# Patient Record
Sex: Female | Born: 1935 | Race: White | Hispanic: No | Smoking: Never smoker
Health system: Southern US, Community
[De-identification: ages and names within clinical notes are randomized; demographics above are authoritative.]

## PROBLEM LIST (undated history)

## (undated) DIAGNOSIS — E039 Hypothyroidism, unspecified: Secondary | ICD-10-CM

## (undated) DIAGNOSIS — I10 Essential (primary) hypertension: Secondary | ICD-10-CM

## (undated) DIAGNOSIS — S72002A Fracture of unspecified part of neck of left femur, initial encounter for closed fracture: Secondary | ICD-10-CM

## (undated) DIAGNOSIS — I739 Peripheral vascular disease, unspecified: Secondary | ICD-10-CM

## (undated) DIAGNOSIS — J449 Chronic obstructive pulmonary disease, unspecified: Secondary | ICD-10-CM

## (undated) DIAGNOSIS — I509 Heart failure, unspecified: Secondary | ICD-10-CM

## (undated) DIAGNOSIS — S72109A Unspecified trochanteric fracture of unspecified femur, initial encounter for closed fracture: Secondary | ICD-10-CM

## (undated) DIAGNOSIS — K219 Gastro-esophageal reflux disease without esophagitis: Secondary | ICD-10-CM

## (undated) DIAGNOSIS — F0390 Unspecified dementia without behavioral disturbance: Secondary | ICD-10-CM

## (undated) DIAGNOSIS — J9611 Chronic respiratory failure with hypoxia: Secondary | ICD-10-CM

## (undated) DIAGNOSIS — G9340 Encephalopathy, unspecified: Secondary | ICD-10-CM

## (undated) DIAGNOSIS — R479 Unspecified speech disturbances: Secondary | ICD-10-CM

## (undated) DIAGNOSIS — I5032 Chronic diastolic (congestive) heart failure: Secondary | ICD-10-CM

## (undated) DIAGNOSIS — I4891 Unspecified atrial fibrillation: Secondary | ICD-10-CM

## (undated) DIAGNOSIS — H409 Unspecified glaucoma: Secondary | ICD-10-CM

## (undated) HISTORY — DX: Hypothyroidism, unspecified: E03.9

## (undated) HISTORY — DX: Unspecified glaucoma: H40.9

## (undated) HISTORY — DX: Encephalopathy, unspecified: G93.40

## (undated) HISTORY — DX: Unspecified dementia without behavioral disturbance: F03.90

## (undated) HISTORY — DX: Unspecified trochanteric fracture of unspecified femur, initial encounter for closed fracture: S72.109A

## (undated) HISTORY — DX: Peripheral vascular disease, unspecified: I73.9

## (undated) HISTORY — DX: Chronic respiratory failure with hypoxia: J96.11

## (undated) HISTORY — DX: Unspecified speech disturbances: R47.9

## (undated) HISTORY — DX: Fracture of unspecified part of neck of left femur, initial encounter for closed fracture: S72.002A

## (undated) HISTORY — PX: HERNIA REPAIR: SHX51

## (undated) HISTORY — DX: Chronic diastolic (congestive) heart failure: I50.32

## (undated) HISTORY — DX: Gastro-esophageal reflux disease without esophagitis: K21.9

---

## 2014-11-26 DIAGNOSIS — K5901 Slow transit constipation: Secondary | ICD-10-CM | POA: Diagnosis not present

## 2014-11-26 DIAGNOSIS — R0989 Other specified symptoms and signs involving the circulatory and respiratory systems: Secondary | ICD-10-CM | POA: Diagnosis not present

## 2014-11-26 DIAGNOSIS — G894 Chronic pain syndrome: Secondary | ICD-10-CM | POA: Diagnosis not present

## 2014-11-26 DIAGNOSIS — R230 Cyanosis: Secondary | ICD-10-CM | POA: Diagnosis not present

## 2014-11-26 DIAGNOSIS — Z7189 Other specified counseling: Secondary | ICD-10-CM | POA: Diagnosis not present

## 2014-11-26 DIAGNOSIS — I739 Peripheral vascular disease, unspecified: Secondary | ICD-10-CM | POA: Diagnosis not present

## 2014-11-26 DIAGNOSIS — I719 Aortic aneurysm of unspecified site, without rupture: Secondary | ICD-10-CM | POA: Diagnosis not present

## 2014-11-26 DIAGNOSIS — R2681 Unsteadiness on feet: Secondary | ICD-10-CM | POA: Diagnosis not present

## 2014-11-26 DIAGNOSIS — R7989 Other specified abnormal findings of blood chemistry: Secondary | ICD-10-CM | POA: Diagnosis not present

## 2014-11-26 DIAGNOSIS — K5701 Diverticulitis of small intestine with perforation and abscess with bleeding: Secondary | ICD-10-CM | POA: Diagnosis not present

## 2014-11-26 DIAGNOSIS — Z0000 Encounter for general adult medical examination without abnormal findings: Secondary | ICD-10-CM | POA: Diagnosis not present

## 2014-11-26 DIAGNOSIS — M150 Primary generalized (osteo)arthritis: Secondary | ICD-10-CM | POA: Diagnosis not present

## 2014-11-26 DIAGNOSIS — H9193 Unspecified hearing loss, bilateral: Secondary | ICD-10-CM | POA: Diagnosis not present

## 2014-11-26 DIAGNOSIS — H3530 Unspecified macular degeneration: Secondary | ICD-10-CM | POA: Diagnosis not present

## 2014-12-02 DIAGNOSIS — H3532 Exudative age-related macular degeneration: Secondary | ICD-10-CM | POA: Diagnosis not present

## 2014-12-07 DIAGNOSIS — R0989 Other specified symptoms and signs involving the circulatory and respiratory systems: Secondary | ICD-10-CM | POA: Diagnosis not present

## 2014-12-07 DIAGNOSIS — Z7901 Long term (current) use of anticoagulants: Secondary | ICD-10-CM | POA: Diagnosis not present

## 2014-12-07 DIAGNOSIS — R230 Cyanosis: Secondary | ICD-10-CM | POA: Diagnosis not present

## 2014-12-07 DIAGNOSIS — S81802S Unspecified open wound, left lower leg, sequela: Secondary | ICD-10-CM | POA: Diagnosis not present

## 2014-12-07 DIAGNOSIS — S81802A Unspecified open wound, left lower leg, initial encounter: Secondary | ICD-10-CM | POA: Diagnosis not present

## 2014-12-07 DIAGNOSIS — R2681 Unsteadiness on feet: Secondary | ICD-10-CM | POA: Diagnosis not present

## 2014-12-07 DIAGNOSIS — Z7982 Long term (current) use of aspirin: Secondary | ICD-10-CM | POA: Diagnosis not present

## 2014-12-31 DIAGNOSIS — Z7982 Long term (current) use of aspirin: Secondary | ICD-10-CM | POA: Diagnosis not present

## 2014-12-31 DIAGNOSIS — I10 Essential (primary) hypertension: Secondary | ICD-10-CM | POA: Diagnosis not present

## 2014-12-31 DIAGNOSIS — H401234 Low-tension glaucoma, bilateral, indeterminate stage: Secondary | ICD-10-CM | POA: Diagnosis not present

## 2014-12-31 DIAGNOSIS — Z961 Presence of intraocular lens: Secondary | ICD-10-CM | POA: Diagnosis not present

## 2014-12-31 DIAGNOSIS — H04123 Dry eye syndrome of bilateral lacrimal glands: Secondary | ICD-10-CM | POA: Diagnosis not present

## 2014-12-31 DIAGNOSIS — H1851 Endothelial corneal dystrophy: Secondary | ICD-10-CM | POA: Diagnosis not present

## 2014-12-31 DIAGNOSIS — H3531 Nonexudative age-related macular degeneration: Secondary | ICD-10-CM | POA: Diagnosis not present

## 2014-12-31 DIAGNOSIS — Z7901 Long term (current) use of anticoagulants: Secondary | ICD-10-CM | POA: Diagnosis not present

## 2015-01-11 DIAGNOSIS — S81802D Unspecified open wound, left lower leg, subsequent encounter: Secondary | ICD-10-CM | POA: Diagnosis not present

## 2015-01-11 DIAGNOSIS — I8312 Varicose veins of left lower extremity with inflammation: Secondary | ICD-10-CM | POA: Diagnosis not present

## 2015-01-11 DIAGNOSIS — R2681 Unsteadiness on feet: Secondary | ICD-10-CM | POA: Diagnosis not present

## 2015-01-11 DIAGNOSIS — I739 Peripheral vascular disease, unspecified: Secondary | ICD-10-CM | POA: Diagnosis not present

## 2015-01-11 DIAGNOSIS — I872 Venous insufficiency (chronic) (peripheral): Secondary | ICD-10-CM | POA: Diagnosis not present

## 2015-01-26 DIAGNOSIS — Z880 Allergy status to penicillin: Secondary | ICD-10-CM | POA: Diagnosis not present

## 2015-01-26 DIAGNOSIS — Z7982 Long term (current) use of aspirin: Secondary | ICD-10-CM | POA: Diagnosis not present

## 2015-01-26 DIAGNOSIS — I509 Heart failure, unspecified: Secondary | ICD-10-CM | POA: Diagnosis not present

## 2015-01-26 DIAGNOSIS — L97922 Non-pressure chronic ulcer of unspecified part of left lower leg with fat layer exposed: Secondary | ICD-10-CM | POA: Diagnosis not present

## 2015-01-26 DIAGNOSIS — I8311 Varicose veins of right lower extremity with inflammation: Secondary | ICD-10-CM | POA: Diagnosis not present

## 2015-01-26 DIAGNOSIS — I4891 Unspecified atrial fibrillation: Secondary | ICD-10-CM | POA: Diagnosis not present

## 2015-01-26 DIAGNOSIS — I872 Venous insufficiency (chronic) (peripheral): Secondary | ICD-10-CM | POA: Diagnosis not present

## 2015-01-26 DIAGNOSIS — I739 Peripheral vascular disease, unspecified: Secondary | ICD-10-CM | POA: Diagnosis not present

## 2015-01-26 DIAGNOSIS — Z79899 Other long term (current) drug therapy: Secondary | ICD-10-CM | POA: Diagnosis not present

## 2015-01-26 DIAGNOSIS — I272 Other secondary pulmonary hypertension: Secondary | ICD-10-CM | POA: Diagnosis not present

## 2015-01-26 DIAGNOSIS — I8312 Varicose veins of left lower extremity with inflammation: Secondary | ICD-10-CM | POA: Diagnosis not present

## 2015-01-26 DIAGNOSIS — S81802D Unspecified open wound, left lower leg, subsequent encounter: Secondary | ICD-10-CM | POA: Diagnosis not present

## 2015-01-26 DIAGNOSIS — R600 Localized edema: Secondary | ICD-10-CM | POA: Diagnosis not present

## 2015-02-02 DIAGNOSIS — Z7982 Long term (current) use of aspirin: Secondary | ICD-10-CM | POA: Diagnosis not present

## 2015-02-02 DIAGNOSIS — I10 Essential (primary) hypertension: Secondary | ICD-10-CM | POA: Diagnosis not present

## 2015-02-02 DIAGNOSIS — H401234 Low-tension glaucoma, bilateral, indeterminate stage: Secondary | ICD-10-CM | POA: Diagnosis not present

## 2015-02-02 DIAGNOSIS — H1851 Endothelial corneal dystrophy: Secondary | ICD-10-CM | POA: Diagnosis not present

## 2015-02-02 DIAGNOSIS — H3531 Nonexudative age-related macular degeneration: Secondary | ICD-10-CM | POA: Diagnosis not present

## 2015-02-02 DIAGNOSIS — Z961 Presence of intraocular lens: Secondary | ICD-10-CM | POA: Diagnosis not present

## 2015-02-02 DIAGNOSIS — I872 Venous insufficiency (chronic) (peripheral): Secondary | ICD-10-CM | POA: Diagnosis not present

## 2015-02-02 DIAGNOSIS — I509 Heart failure, unspecified: Secondary | ICD-10-CM | POA: Diagnosis not present

## 2015-02-02 DIAGNOSIS — Z880 Allergy status to penicillin: Secondary | ICD-10-CM | POA: Diagnosis not present

## 2015-02-02 DIAGNOSIS — S81802D Unspecified open wound, left lower leg, subsequent encounter: Secondary | ICD-10-CM | POA: Diagnosis not present

## 2015-02-04 DIAGNOSIS — I872 Venous insufficiency (chronic) (peripheral): Secondary | ICD-10-CM | POA: Diagnosis not present

## 2015-02-04 DIAGNOSIS — F0390 Unspecified dementia without behavioral disturbance: Secondary | ICD-10-CM | POA: Diagnosis not present

## 2015-02-04 DIAGNOSIS — I4891 Unspecified atrial fibrillation: Secondary | ICD-10-CM | POA: Diagnosis not present

## 2015-02-04 DIAGNOSIS — I272 Other secondary pulmonary hypertension: Secondary | ICD-10-CM | POA: Diagnosis not present

## 2015-02-04 DIAGNOSIS — M4854XD Collapsed vertebra, not elsewhere classified, thoracic region, subsequent encounter for fracture with routine healing: Secondary | ICD-10-CM | POA: Diagnosis not present

## 2015-02-04 DIAGNOSIS — R296 Repeated falls: Secondary | ICD-10-CM | POA: Diagnosis not present

## 2015-02-04 DIAGNOSIS — I83028 Varicose veins of left lower extremity with ulcer other part of lower leg: Secondary | ICD-10-CM | POA: Diagnosis not present

## 2015-02-04 DIAGNOSIS — E785 Hyperlipidemia, unspecified: Secondary | ICD-10-CM | POA: Diagnosis not present

## 2015-02-04 DIAGNOSIS — I739 Peripheral vascular disease, unspecified: Secondary | ICD-10-CM | POA: Diagnosis not present

## 2015-02-04 DIAGNOSIS — I270 Primary pulmonary hypertension: Secondary | ICD-10-CM | POA: Diagnosis not present

## 2015-02-04 DIAGNOSIS — I83029 Varicose veins of left lower extremity with ulcer of unspecified site: Secondary | ICD-10-CM | POA: Diagnosis not present

## 2015-02-04 DIAGNOSIS — Z7901 Long term (current) use of anticoagulants: Secondary | ICD-10-CM | POA: Diagnosis not present

## 2015-02-04 DIAGNOSIS — R54 Age-related physical debility: Secondary | ICD-10-CM | POA: Diagnosis not present

## 2015-02-04 DIAGNOSIS — I10 Essential (primary) hypertension: Secondary | ICD-10-CM | POA: Diagnosis not present

## 2015-02-04 DIAGNOSIS — H3530 Unspecified macular degeneration: Secondary | ICD-10-CM | POA: Diagnosis not present

## 2015-02-04 DIAGNOSIS — M549 Dorsalgia, unspecified: Secondary | ICD-10-CM | POA: Diagnosis not present

## 2015-02-04 DIAGNOSIS — K862 Cyst of pancreas: Secondary | ICD-10-CM | POA: Diagnosis not present

## 2015-02-04 DIAGNOSIS — D259 Leiomyoma of uterus, unspecified: Secondary | ICD-10-CM | POA: Diagnosis not present

## 2015-02-04 DIAGNOSIS — Z23 Encounter for immunization: Secondary | ICD-10-CM | POA: Diagnosis not present

## 2015-02-04 DIAGNOSIS — D473 Essential (hemorrhagic) thrombocythemia: Secondary | ICD-10-CM | POA: Diagnosis not present

## 2015-02-04 DIAGNOSIS — E041 Nontoxic single thyroid nodule: Secondary | ICD-10-CM | POA: Diagnosis not present

## 2015-02-04 DIAGNOSIS — Z66 Do not resuscitate: Secondary | ICD-10-CM | POA: Diagnosis not present

## 2015-02-04 DIAGNOSIS — R600 Localized edema: Secondary | ICD-10-CM | POA: Diagnosis not present

## 2015-02-04 DIAGNOSIS — D696 Thrombocytopenia, unspecified: Secondary | ICD-10-CM | POA: Diagnosis not present

## 2015-02-04 DIAGNOSIS — Z7982 Long term (current) use of aspirin: Secondary | ICD-10-CM | POA: Diagnosis not present

## 2015-02-04 DIAGNOSIS — H9193 Unspecified hearing loss, bilateral: Secondary | ICD-10-CM | POA: Diagnosis not present

## 2015-02-04 DIAGNOSIS — M792 Neuralgia and neuritis, unspecified: Secondary | ICD-10-CM | POA: Diagnosis not present

## 2015-02-04 DIAGNOSIS — L97829 Non-pressure chronic ulcer of other part of left lower leg with unspecified severity: Secondary | ICD-10-CM | POA: Diagnosis not present

## 2015-02-04 DIAGNOSIS — M150 Primary generalized (osteo)arthritis: Secondary | ICD-10-CM | POA: Diagnosis not present

## 2015-02-09 DIAGNOSIS — I8311 Varicose veins of right lower extremity with inflammation: Secondary | ICD-10-CM | POA: Diagnosis not present

## 2015-02-09 DIAGNOSIS — I8312 Varicose veins of left lower extremity with inflammation: Secondary | ICD-10-CM | POA: Diagnosis not present

## 2015-02-09 DIAGNOSIS — L97922 Non-pressure chronic ulcer of unspecified part of left lower leg with fat layer exposed: Secondary | ICD-10-CM | POA: Diagnosis not present

## 2015-02-09 DIAGNOSIS — I509 Heart failure, unspecified: Secondary | ICD-10-CM | POA: Diagnosis not present

## 2015-02-09 DIAGNOSIS — I872 Venous insufficiency (chronic) (peripheral): Secondary | ICD-10-CM | POA: Diagnosis not present

## 2015-02-09 DIAGNOSIS — I4891 Unspecified atrial fibrillation: Secondary | ICD-10-CM | POA: Diagnosis not present

## 2015-02-09 DIAGNOSIS — I739 Peripheral vascular disease, unspecified: Secondary | ICD-10-CM | POA: Diagnosis not present

## 2015-02-09 DIAGNOSIS — I83018 Varicose veins of right lower extremity with ulcer other part of lower leg: Secondary | ICD-10-CM | POA: Diagnosis not present

## 2015-02-09 DIAGNOSIS — I272 Other secondary pulmonary hypertension: Secondary | ICD-10-CM | POA: Diagnosis not present

## 2015-02-16 DIAGNOSIS — S81802D Unspecified open wound, left lower leg, subsequent encounter: Secondary | ICD-10-CM | POA: Diagnosis not present

## 2015-02-16 DIAGNOSIS — I8311 Varicose veins of right lower extremity with inflammation: Secondary | ICD-10-CM | POA: Diagnosis not present

## 2015-02-16 DIAGNOSIS — R600 Localized edema: Secondary | ICD-10-CM | POA: Diagnosis not present

## 2015-02-16 DIAGNOSIS — L97922 Non-pressure chronic ulcer of unspecified part of left lower leg with fat layer exposed: Secondary | ICD-10-CM | POA: Diagnosis not present

## 2015-02-16 DIAGNOSIS — I8312 Varicose veins of left lower extremity with inflammation: Secondary | ICD-10-CM | POA: Diagnosis not present

## 2015-02-16 DIAGNOSIS — I872 Venous insufficiency (chronic) (peripheral): Secondary | ICD-10-CM | POA: Diagnosis not present

## 2015-02-23 DIAGNOSIS — I739 Peripheral vascular disease, unspecified: Secondary | ICD-10-CM | POA: Diagnosis not present

## 2015-02-23 DIAGNOSIS — I872 Venous insufficiency (chronic) (peripheral): Secondary | ICD-10-CM | POA: Diagnosis not present

## 2015-02-23 DIAGNOSIS — L97922 Non-pressure chronic ulcer of unspecified part of left lower leg with fat layer exposed: Secondary | ICD-10-CM | POA: Diagnosis not present

## 2015-03-02 DIAGNOSIS — L97922 Non-pressure chronic ulcer of unspecified part of left lower leg with fat layer exposed: Secondary | ICD-10-CM | POA: Diagnosis not present

## 2015-03-02 DIAGNOSIS — I872 Venous insufficiency (chronic) (peripheral): Secondary | ICD-10-CM | POA: Diagnosis not present

## 2015-03-08 DIAGNOSIS — Z9841 Cataract extraction status, right eye: Secondary | ICD-10-CM | POA: Diagnosis not present

## 2015-03-08 DIAGNOSIS — H401233 Low-tension glaucoma, bilateral, severe stage: Secondary | ICD-10-CM | POA: Diagnosis not present

## 2015-03-08 DIAGNOSIS — Z833 Family history of diabetes mellitus: Secondary | ICD-10-CM | POA: Diagnosis not present

## 2015-03-08 DIAGNOSIS — Z961 Presence of intraocular lens: Secondary | ICD-10-CM | POA: Diagnosis not present

## 2015-03-08 DIAGNOSIS — H401213 Low-tension glaucoma, right eye, severe stage: Secondary | ICD-10-CM | POA: Diagnosis not present

## 2015-03-08 DIAGNOSIS — Z9842 Cataract extraction status, left eye: Secondary | ICD-10-CM | POA: Diagnosis not present

## 2015-03-08 DIAGNOSIS — H3531 Nonexudative age-related macular degeneration: Secondary | ICD-10-CM | POA: Diagnosis not present

## 2015-03-08 DIAGNOSIS — H401223 Low-tension glaucoma, left eye, severe stage: Secondary | ICD-10-CM | POA: Diagnosis not present

## 2015-03-08 DIAGNOSIS — Z8679 Personal history of other diseases of the circulatory system: Secondary | ICD-10-CM | POA: Diagnosis not present

## 2015-03-08 DIAGNOSIS — Z823 Family history of stroke: Secondary | ICD-10-CM | POA: Diagnosis not present

## 2015-03-09 DIAGNOSIS — I509 Heart failure, unspecified: Secondary | ICD-10-CM | POA: Diagnosis not present

## 2015-03-09 DIAGNOSIS — I4891 Unspecified atrial fibrillation: Secondary | ICD-10-CM | POA: Diagnosis not present

## 2015-03-09 DIAGNOSIS — I872 Venous insufficiency (chronic) (peripheral): Secondary | ICD-10-CM | POA: Diagnosis not present

## 2015-03-09 DIAGNOSIS — R600 Localized edema: Secondary | ICD-10-CM | POA: Diagnosis not present

## 2015-03-09 DIAGNOSIS — I272 Other secondary pulmonary hypertension: Secondary | ICD-10-CM | POA: Diagnosis not present

## 2015-03-09 DIAGNOSIS — R630 Anorexia: Secondary | ICD-10-CM | POA: Diagnosis not present

## 2015-03-09 DIAGNOSIS — R1310 Dysphagia, unspecified: Secondary | ICD-10-CM | POA: Diagnosis not present

## 2015-03-09 DIAGNOSIS — L97922 Non-pressure chronic ulcer of unspecified part of left lower leg with fat layer exposed: Secondary | ICD-10-CM | POA: Diagnosis not present

## 2015-03-16 DIAGNOSIS — I8390 Asymptomatic varicose veins of unspecified lower extremity: Secondary | ICD-10-CM | POA: Diagnosis not present

## 2015-03-16 DIAGNOSIS — I509 Heart failure, unspecified: Secondary | ICD-10-CM | POA: Diagnosis not present

## 2015-03-16 DIAGNOSIS — L97922 Non-pressure chronic ulcer of unspecified part of left lower leg with fat layer exposed: Secondary | ICD-10-CM | POA: Diagnosis not present

## 2015-03-16 DIAGNOSIS — R531 Weakness: Secondary | ICD-10-CM | POA: Diagnosis not present

## 2015-03-16 DIAGNOSIS — I739 Peripheral vascular disease, unspecified: Secondary | ICD-10-CM | POA: Diagnosis not present

## 2015-03-16 DIAGNOSIS — I10 Essential (primary) hypertension: Secondary | ICD-10-CM | POA: Diagnosis not present

## 2015-03-16 DIAGNOSIS — L97929 Non-pressure chronic ulcer of unspecified part of left lower leg with unspecified severity: Secondary | ICD-10-CM | POA: Diagnosis not present

## 2015-03-16 DIAGNOSIS — I272 Other secondary pulmonary hypertension: Secondary | ICD-10-CM | POA: Diagnosis not present

## 2015-03-16 DIAGNOSIS — L97812 Non-pressure chronic ulcer of other part of right lower leg with fat layer exposed: Secondary | ICD-10-CM | POA: Diagnosis not present

## 2015-03-16 DIAGNOSIS — F411 Generalized anxiety disorder: Secondary | ICD-10-CM | POA: Diagnosis not present

## 2015-03-16 DIAGNOSIS — I4891 Unspecified atrial fibrillation: Secondary | ICD-10-CM | POA: Diagnosis not present

## 2015-03-16 DIAGNOSIS — I872 Venous insufficiency (chronic) (peripheral): Secondary | ICD-10-CM | POA: Diagnosis not present

## 2015-03-22 DIAGNOSIS — E43 Unspecified severe protein-calorie malnutrition: Secondary | ICD-10-CM | POA: Diagnosis not present

## 2015-03-22 DIAGNOSIS — R4182 Altered mental status, unspecified: Secondary | ICD-10-CM | POA: Diagnosis not present

## 2015-03-22 DIAGNOSIS — T452X5A Adverse effect of vitamins, initial encounter: Secondary | ICD-10-CM | POA: Diagnosis present

## 2015-03-22 DIAGNOSIS — R110 Nausea: Secondary | ICD-10-CM | POA: Diagnosis not present

## 2015-03-22 DIAGNOSIS — Z66 Do not resuscitate: Secondary | ICD-10-CM | POA: Diagnosis present

## 2015-03-22 DIAGNOSIS — E785 Hyperlipidemia, unspecified: Secondary | ICD-10-CM | POA: Diagnosis present

## 2015-03-22 DIAGNOSIS — R2689 Other abnormalities of gait and mobility: Secondary | ICD-10-CM | POA: Diagnosis not present

## 2015-03-22 DIAGNOSIS — L97829 Non-pressure chronic ulcer of other part of left lower leg with unspecified severity: Secondary | ICD-10-CM | POA: Diagnosis not present

## 2015-03-22 DIAGNOSIS — I351 Nonrheumatic aortic (valve) insufficiency: Secondary | ICD-10-CM | POA: Diagnosis not present

## 2015-03-22 DIAGNOSIS — I361 Nonrheumatic tricuspid (valve) insufficiency: Secondary | ICD-10-CM | POA: Diagnosis not present

## 2015-03-22 DIAGNOSIS — I10 Essential (primary) hypertension: Secondary | ICD-10-CM | POA: Diagnosis present

## 2015-03-22 DIAGNOSIS — I83028 Varicose veins of left lower extremity with ulcer other part of lower leg: Secondary | ICD-10-CM | POA: Diagnosis not present

## 2015-03-22 DIAGNOSIS — M6281 Muscle weakness (generalized): Secondary | ICD-10-CM | POA: Diagnosis not present

## 2015-03-22 DIAGNOSIS — R296 Repeated falls: Secondary | ICD-10-CM | POA: Diagnosis present

## 2015-03-22 DIAGNOSIS — I340 Nonrheumatic mitral (valve) insufficiency: Secondary | ICD-10-CM | POA: Diagnosis not present

## 2015-03-22 DIAGNOSIS — R634 Abnormal weight loss: Secondary | ICD-10-CM | POA: Diagnosis not present

## 2015-03-22 DIAGNOSIS — I270 Primary pulmonary hypertension: Secondary | ICD-10-CM | POA: Diagnosis not present

## 2015-03-22 DIAGNOSIS — I5032 Chronic diastolic (congestive) heart failure: Secondary | ICD-10-CM | POA: Diagnosis not present

## 2015-03-22 DIAGNOSIS — S81802D Unspecified open wound, left lower leg, subsequent encounter: Secondary | ICD-10-CM | POA: Diagnosis not present

## 2015-03-22 DIAGNOSIS — I272 Other secondary pulmonary hypertension: Secondary | ICD-10-CM | POA: Diagnosis present

## 2015-03-22 DIAGNOSIS — R350 Frequency of micturition: Secondary | ICD-10-CM | POA: Diagnosis not present

## 2015-03-22 DIAGNOSIS — I872 Venous insufficiency (chronic) (peripheral): Secondary | ICD-10-CM | POA: Diagnosis present

## 2015-03-22 DIAGNOSIS — I878 Other specified disorders of veins: Secondary | ICD-10-CM | POA: Diagnosis present

## 2015-03-22 DIAGNOSIS — L97929 Non-pressure chronic ulcer of unspecified part of left lower leg with unspecified severity: Secondary | ICD-10-CM | POA: Diagnosis present

## 2015-03-22 DIAGNOSIS — I4891 Unspecified atrial fibrillation: Secondary | ICD-10-CM | POA: Diagnosis not present

## 2015-03-22 DIAGNOSIS — I482 Chronic atrial fibrillation: Secondary | ICD-10-CM | POA: Diagnosis present

## 2015-03-22 DIAGNOSIS — K862 Cyst of pancreas: Secondary | ICD-10-CM | POA: Diagnosis present

## 2015-03-22 DIAGNOSIS — T148 Other injury of unspecified body region: Secondary | ICD-10-CM | POA: Diagnosis not present

## 2015-03-22 DIAGNOSIS — N390 Urinary tract infection, site not specified: Secondary | ICD-10-CM | POA: Diagnosis not present

## 2015-03-22 DIAGNOSIS — R410 Disorientation, unspecified: Secondary | ICD-10-CM | POA: Diagnosis not present

## 2015-03-22 DIAGNOSIS — R1310 Dysphagia, unspecified: Secondary | ICD-10-CM | POA: Diagnosis present

## 2015-03-27 DIAGNOSIS — I872 Venous insufficiency (chronic) (peripheral): Secondary | ICD-10-CM | POA: Diagnosis not present

## 2015-03-27 DIAGNOSIS — H3531 Nonexudative age-related macular degeneration: Secondary | ICD-10-CM | POA: Diagnosis not present

## 2015-03-27 DIAGNOSIS — Z823 Family history of stroke: Secondary | ICD-10-CM | POA: Diagnosis not present

## 2015-03-27 DIAGNOSIS — I4891 Unspecified atrial fibrillation: Secondary | ICD-10-CM | POA: Diagnosis not present

## 2015-03-27 DIAGNOSIS — S81802D Unspecified open wound, left lower leg, subsequent encounter: Secondary | ICD-10-CM | POA: Diagnosis not present

## 2015-03-27 DIAGNOSIS — M810 Age-related osteoporosis without current pathological fracture: Secondary | ICD-10-CM | POA: Diagnosis present

## 2015-03-27 DIAGNOSIS — I272 Other secondary pulmonary hypertension: Secondary | ICD-10-CM | POA: Diagnosis present

## 2015-03-27 DIAGNOSIS — M7989 Other specified soft tissue disorders: Secondary | ICD-10-CM | POA: Diagnosis not present

## 2015-03-27 DIAGNOSIS — Z833 Family history of diabetes mellitus: Secondary | ICD-10-CM | POA: Diagnosis not present

## 2015-03-27 DIAGNOSIS — E46 Unspecified protein-calorie malnutrition: Secondary | ICD-10-CM | POA: Diagnosis not present

## 2015-03-27 DIAGNOSIS — H401234 Low-tension glaucoma, bilateral, indeterminate stage: Secondary | ICD-10-CM | POA: Diagnosis not present

## 2015-03-27 DIAGNOSIS — I509 Heart failure, unspecified: Secondary | ICD-10-CM | POA: Diagnosis not present

## 2015-03-27 DIAGNOSIS — Z66 Do not resuscitate: Secondary | ICD-10-CM | POA: Diagnosis present

## 2015-03-27 DIAGNOSIS — I5032 Chronic diastolic (congestive) heart failure: Secondary | ICD-10-CM | POA: Diagnosis not present

## 2015-03-27 DIAGNOSIS — M479 Spondylosis, unspecified: Secondary | ICD-10-CM | POA: Diagnosis present

## 2015-03-27 DIAGNOSIS — I5033 Acute on chronic diastolic (congestive) heart failure: Secondary | ICD-10-CM | POA: Diagnosis present

## 2015-03-27 DIAGNOSIS — H9193 Unspecified hearing loss, bilateral: Secondary | ICD-10-CM | POA: Diagnosis present

## 2015-03-27 DIAGNOSIS — R600 Localized edema: Secondary | ICD-10-CM | POA: Diagnosis not present

## 2015-03-27 DIAGNOSIS — R0602 Shortness of breath: Secondary | ICD-10-CM | POA: Diagnosis not present

## 2015-03-27 DIAGNOSIS — E785 Hyperlipidemia, unspecified: Secondary | ICD-10-CM | POA: Diagnosis present

## 2015-03-27 DIAGNOSIS — R2689 Other abnormalities of gait and mobility: Secondary | ICD-10-CM | POA: Diagnosis not present

## 2015-03-27 DIAGNOSIS — M8580 Other specified disorders of bone density and structure, unspecified site: Secondary | ICD-10-CM | POA: Diagnosis not present

## 2015-03-27 DIAGNOSIS — R1310 Dysphagia, unspecified: Secondary | ICD-10-CM | POA: Diagnosis not present

## 2015-03-27 DIAGNOSIS — J90 Pleural effusion, not elsewhere classified: Secondary | ICD-10-CM | POA: Diagnosis not present

## 2015-03-27 DIAGNOSIS — H1851 Endothelial corneal dystrophy: Secondary | ICD-10-CM | POA: Diagnosis not present

## 2015-03-27 DIAGNOSIS — M6281 Muscle weakness (generalized): Secondary | ICD-10-CM | POA: Diagnosis not present

## 2015-03-27 DIAGNOSIS — I482 Chronic atrial fibrillation: Secondary | ICD-10-CM | POA: Diagnosis not present

## 2015-03-27 DIAGNOSIS — R0600 Dyspnea, unspecified: Secondary | ICD-10-CM | POA: Diagnosis not present

## 2015-03-27 DIAGNOSIS — Z85828 Personal history of other malignant neoplasm of skin: Secondary | ICD-10-CM | POA: Diagnosis not present

## 2015-03-27 DIAGNOSIS — I480 Paroxysmal atrial fibrillation: Secondary | ICD-10-CM | POA: Diagnosis present

## 2015-03-27 DIAGNOSIS — I10 Essential (primary) hypertension: Secondary | ICD-10-CM | POA: Diagnosis present

## 2015-03-27 DIAGNOSIS — N390 Urinary tract infection, site not specified: Secondary | ICD-10-CM | POA: Diagnosis not present

## 2015-03-27 DIAGNOSIS — H409 Unspecified glaucoma: Secondary | ICD-10-CM | POA: Diagnosis present

## 2015-03-27 DIAGNOSIS — L97822 Non-pressure chronic ulcer of other part of left lower leg with fat layer exposed: Secondary | ICD-10-CM | POA: Diagnosis not present

## 2015-03-27 DIAGNOSIS — I87309 Chronic venous hypertension (idiopathic) without complications of unspecified lower extremity: Secondary | ICD-10-CM | POA: Diagnosis present

## 2015-03-27 DIAGNOSIS — I739 Peripheral vascular disease, unspecified: Secondary | ICD-10-CM | POA: Diagnosis not present

## 2015-03-27 DIAGNOSIS — Z809 Family history of malignant neoplasm, unspecified: Secondary | ICD-10-CM | POA: Diagnosis not present

## 2015-03-27 DIAGNOSIS — H3530 Unspecified macular degeneration: Secondary | ICD-10-CM | POA: Diagnosis present

## 2015-03-27 DIAGNOSIS — F329 Major depressive disorder, single episode, unspecified: Secondary | ICD-10-CM | POA: Diagnosis present

## 2015-03-27 DIAGNOSIS — I83028 Varicose veins of left lower extremity with ulcer other part of lower leg: Secondary | ICD-10-CM | POA: Diagnosis not present

## 2015-03-27 DIAGNOSIS — E43 Unspecified severe protein-calorie malnutrition: Secondary | ICD-10-CM | POA: Diagnosis not present

## 2015-03-27 DIAGNOSIS — Z961 Presence of intraocular lens: Secondary | ICD-10-CM | POA: Diagnosis not present

## 2015-03-27 DIAGNOSIS — D696 Thrombocytopenia, unspecified: Secondary | ICD-10-CM | POA: Diagnosis present

## 2015-03-27 DIAGNOSIS — L97922 Non-pressure chronic ulcer of unspecified part of left lower leg with fat layer exposed: Secondary | ICD-10-CM | POA: Diagnosis not present

## 2015-03-27 DIAGNOSIS — Z7982 Long term (current) use of aspirin: Secondary | ICD-10-CM | POA: Diagnosis not present

## 2015-03-27 DIAGNOSIS — I081 Rheumatic disorders of both mitral and tricuspid valves: Secondary | ICD-10-CM | POA: Diagnosis present

## 2015-03-27 DIAGNOSIS — L97929 Non-pressure chronic ulcer of unspecified part of left lower leg with unspecified severity: Secondary | ICD-10-CM | POA: Diagnosis present

## 2015-03-27 DIAGNOSIS — G8929 Other chronic pain: Secondary | ICD-10-CM | POA: Diagnosis present

## 2015-04-01 DIAGNOSIS — I872 Venous insufficiency (chronic) (peripheral): Secondary | ICD-10-CM | POA: Diagnosis not present

## 2015-04-01 DIAGNOSIS — L97822 Non-pressure chronic ulcer of other part of left lower leg with fat layer exposed: Secondary | ICD-10-CM | POA: Diagnosis not present

## 2015-04-01 DIAGNOSIS — L97922 Non-pressure chronic ulcer of unspecified part of left lower leg with fat layer exposed: Secondary | ICD-10-CM | POA: Diagnosis not present

## 2015-04-01 DIAGNOSIS — I739 Peripheral vascular disease, unspecified: Secondary | ICD-10-CM | POA: Diagnosis not present

## 2015-04-01 DIAGNOSIS — I83028 Varicose veins of left lower extremity with ulcer other part of lower leg: Secondary | ICD-10-CM | POA: Diagnosis not present

## 2015-04-04 DIAGNOSIS — R1310 Dysphagia, unspecified: Secondary | ICD-10-CM | POA: Diagnosis not present

## 2015-04-04 DIAGNOSIS — I4891 Unspecified atrial fibrillation: Secondary | ICD-10-CM | POA: Diagnosis not present

## 2015-04-04 DIAGNOSIS — M8580 Other specified disorders of bone density and structure, unspecified site: Secondary | ICD-10-CM | POA: Diagnosis not present

## 2015-04-04 DIAGNOSIS — I10 Essential (primary) hypertension: Secondary | ICD-10-CM | POA: Diagnosis not present

## 2015-04-04 DIAGNOSIS — Z7982 Long term (current) use of aspirin: Secondary | ICD-10-CM | POA: Diagnosis not present

## 2015-04-07 DIAGNOSIS — I4891 Unspecified atrial fibrillation: Secondary | ICD-10-CM | POA: Diagnosis not present

## 2015-04-07 DIAGNOSIS — R1310 Dysphagia, unspecified: Secondary | ICD-10-CM | POA: Diagnosis not present

## 2015-04-07 DIAGNOSIS — E46 Unspecified protein-calorie malnutrition: Secondary | ICD-10-CM | POA: Diagnosis not present

## 2015-04-07 DIAGNOSIS — I5032 Chronic diastolic (congestive) heart failure: Secondary | ICD-10-CM | POA: Diagnosis not present

## 2015-04-11 DIAGNOSIS — Z7982 Long term (current) use of aspirin: Secondary | ICD-10-CM | POA: Diagnosis not present

## 2015-04-11 DIAGNOSIS — H3531 Nonexudative age-related macular degeneration: Secondary | ICD-10-CM | POA: Diagnosis not present

## 2015-04-11 DIAGNOSIS — H401234 Low-tension glaucoma, bilateral, indeterminate stage: Secondary | ICD-10-CM | POA: Diagnosis not present

## 2015-04-11 DIAGNOSIS — Z961 Presence of intraocular lens: Secondary | ICD-10-CM | POA: Diagnosis not present

## 2015-04-11 DIAGNOSIS — H1851 Endothelial corneal dystrophy: Secondary | ICD-10-CM | POA: Diagnosis not present

## 2015-04-13 DIAGNOSIS — I872 Venous insufficiency (chronic) (peripheral): Secondary | ICD-10-CM | POA: Diagnosis not present

## 2015-04-13 DIAGNOSIS — L97922 Non-pressure chronic ulcer of unspecified part of left lower leg with fat layer exposed: Secondary | ICD-10-CM | POA: Diagnosis not present

## 2015-04-13 DIAGNOSIS — R600 Localized edema: Secondary | ICD-10-CM | POA: Diagnosis not present

## 2015-04-19 DIAGNOSIS — I509 Heart failure, unspecified: Secondary | ICD-10-CM | POA: Diagnosis not present

## 2015-04-19 DIAGNOSIS — Z823 Family history of stroke: Secondary | ICD-10-CM | POA: Diagnosis not present

## 2015-04-19 DIAGNOSIS — I480 Paroxysmal atrial fibrillation: Secondary | ICD-10-CM | POA: Diagnosis present

## 2015-04-19 DIAGNOSIS — L97929 Non-pressure chronic ulcer of unspecified part of left lower leg with unspecified severity: Secondary | ICD-10-CM | POA: Diagnosis not present

## 2015-04-19 DIAGNOSIS — I872 Venous insufficiency (chronic) (peripheral): Secondary | ICD-10-CM | POA: Diagnosis not present

## 2015-04-19 DIAGNOSIS — M722 Plantar fascial fibromatosis: Secondary | ICD-10-CM | POA: Diagnosis not present

## 2015-04-19 DIAGNOSIS — I87303 Chronic venous hypertension (idiopathic) without complications of bilateral lower extremity: Secondary | ICD-10-CM | POA: Diagnosis not present

## 2015-04-19 DIAGNOSIS — H409 Unspecified glaucoma: Secondary | ICD-10-CM | POA: Diagnosis present

## 2015-04-19 DIAGNOSIS — Z66 Do not resuscitate: Secondary | ICD-10-CM | POA: Diagnosis present

## 2015-04-19 DIAGNOSIS — I771 Stricture of artery: Secondary | ICD-10-CM | POA: Diagnosis not present

## 2015-04-19 DIAGNOSIS — I081 Rheumatic disorders of both mitral and tricuspid valves: Secondary | ICD-10-CM | POA: Diagnosis not present

## 2015-04-19 DIAGNOSIS — M810 Age-related osteoporosis without current pathological fracture: Secondary | ICD-10-CM | POA: Diagnosis present

## 2015-04-19 DIAGNOSIS — I340 Nonrheumatic mitral (valve) insufficiency: Secondary | ICD-10-CM | POA: Diagnosis not present

## 2015-04-19 DIAGNOSIS — E785 Hyperlipidemia, unspecified: Secondary | ICD-10-CM | POA: Diagnosis present

## 2015-04-19 DIAGNOSIS — I87309 Chronic venous hypertension (idiopathic) without complications of unspecified lower extremity: Secondary | ICD-10-CM | POA: Diagnosis present

## 2015-04-19 DIAGNOSIS — L98499 Non-pressure chronic ulcer of skin of other sites with unspecified severity: Secondary | ICD-10-CM | POA: Diagnosis not present

## 2015-04-19 DIAGNOSIS — Z85828 Personal history of other malignant neoplasm of skin: Secondary | ICD-10-CM | POA: Diagnosis not present

## 2015-04-19 DIAGNOSIS — I10 Essential (primary) hypertension: Secondary | ICD-10-CM | POA: Diagnosis present

## 2015-04-19 DIAGNOSIS — M479 Spondylosis, unspecified: Secondary | ICD-10-CM | POA: Diagnosis present

## 2015-04-19 DIAGNOSIS — H3530 Unspecified macular degeneration: Secondary | ICD-10-CM | POA: Diagnosis present

## 2015-04-19 DIAGNOSIS — Z7982 Long term (current) use of aspirin: Secondary | ICD-10-CM | POA: Diagnosis not present

## 2015-04-19 DIAGNOSIS — I5033 Acute on chronic diastolic (congestive) heart failure: Secondary | ICD-10-CM | POA: Diagnosis not present

## 2015-04-19 DIAGNOSIS — H9193 Unspecified hearing loss, bilateral: Secondary | ICD-10-CM | POA: Diagnosis present

## 2015-04-19 DIAGNOSIS — M1990 Unspecified osteoarthritis, unspecified site: Secondary | ICD-10-CM | POA: Diagnosis not present

## 2015-04-19 DIAGNOSIS — I4891 Unspecified atrial fibrillation: Secondary | ICD-10-CM | POA: Diagnosis not present

## 2015-04-19 DIAGNOSIS — I83029 Varicose veins of left lower extremity with ulcer of unspecified site: Secondary | ICD-10-CM | POA: Diagnosis not present

## 2015-04-19 DIAGNOSIS — F329 Major depressive disorder, single episode, unspecified: Secondary | ICD-10-CM | POA: Diagnosis not present

## 2015-04-19 DIAGNOSIS — J309 Allergic rhinitis, unspecified: Secondary | ICD-10-CM | POA: Diagnosis not present

## 2015-04-19 DIAGNOSIS — M7989 Other specified soft tissue disorders: Secondary | ICD-10-CM | POA: Diagnosis not present

## 2015-04-19 DIAGNOSIS — D696 Thrombocytopenia, unspecified: Secondary | ICD-10-CM | POA: Diagnosis not present

## 2015-04-19 DIAGNOSIS — G8929 Other chronic pain: Secondary | ICD-10-CM | POA: Diagnosis present

## 2015-04-19 DIAGNOSIS — I739 Peripheral vascular disease, unspecified: Secondary | ICD-10-CM | POA: Diagnosis not present

## 2015-04-19 DIAGNOSIS — Z833 Family history of diabetes mellitus: Secondary | ICD-10-CM | POA: Diagnosis not present

## 2015-04-19 DIAGNOSIS — R0602 Shortness of breath: Secondary | ICD-10-CM | POA: Diagnosis not present

## 2015-04-19 DIAGNOSIS — S81802A Unspecified open wound, left lower leg, initial encounter: Secondary | ICD-10-CM | POA: Diagnosis not present

## 2015-04-19 DIAGNOSIS — R0600 Dyspnea, unspecified: Secondary | ICD-10-CM | POA: Diagnosis not present

## 2015-04-19 DIAGNOSIS — Z809 Family history of malignant neoplasm, unspecified: Secondary | ICD-10-CM | POA: Diagnosis not present

## 2015-04-19 DIAGNOSIS — I272 Other secondary pulmonary hypertension: Secondary | ICD-10-CM | POA: Diagnosis not present

## 2015-04-19 DIAGNOSIS — I5032 Chronic diastolic (congestive) heart failure: Secondary | ICD-10-CM | POA: Diagnosis not present

## 2015-04-25 DIAGNOSIS — I480 Paroxysmal atrial fibrillation: Secondary | ICD-10-CM | POA: Diagnosis not present

## 2015-04-25 DIAGNOSIS — J309 Allergic rhinitis, unspecified: Secondary | ICD-10-CM | POA: Diagnosis not present

## 2015-04-25 DIAGNOSIS — R5381 Other malaise: Secondary | ICD-10-CM | POA: Diagnosis not present

## 2015-04-25 DIAGNOSIS — R262 Difficulty in walking, not elsewhere classified: Secondary | ICD-10-CM | POA: Diagnosis not present

## 2015-04-25 DIAGNOSIS — L03119 Cellulitis of unspecified part of limb: Secondary | ICD-10-CM | POA: Diagnosis not present

## 2015-04-25 DIAGNOSIS — I87303 Chronic venous hypertension (idiopathic) without complications of bilateral lower extremity: Secondary | ICD-10-CM | POA: Diagnosis not present

## 2015-04-25 DIAGNOSIS — I340 Nonrheumatic mitral (valve) insufficiency: Secondary | ICD-10-CM | POA: Diagnosis not present

## 2015-04-25 DIAGNOSIS — M150 Primary generalized (osteo)arthritis: Secondary | ICD-10-CM | POA: Diagnosis not present

## 2015-04-25 DIAGNOSIS — M722 Plantar fascial fibromatosis: Secondary | ICD-10-CM | POA: Diagnosis not present

## 2015-04-25 DIAGNOSIS — M549 Dorsalgia, unspecified: Secondary | ICD-10-CM | POA: Diagnosis not present

## 2015-04-25 DIAGNOSIS — R600 Localized edema: Secondary | ICD-10-CM | POA: Diagnosis not present

## 2015-04-25 DIAGNOSIS — I890 Lymphedema, not elsewhere classified: Secondary | ICD-10-CM | POA: Diagnosis not present

## 2015-04-25 DIAGNOSIS — I5032 Chronic diastolic (congestive) heart failure: Secondary | ICD-10-CM | POA: Diagnosis not present

## 2015-04-25 DIAGNOSIS — I5033 Acute on chronic diastolic (congestive) heart failure: Secondary | ICD-10-CM | POA: Diagnosis not present

## 2015-04-25 DIAGNOSIS — M810 Age-related osteoporosis without current pathological fracture: Secondary | ICD-10-CM | POA: Diagnosis not present

## 2015-04-25 DIAGNOSIS — M419 Scoliosis, unspecified: Secondary | ICD-10-CM | POA: Diagnosis not present

## 2015-04-25 DIAGNOSIS — R2242 Localized swelling, mass and lump, left lower limb: Secondary | ICD-10-CM | POA: Diagnosis not present

## 2015-04-25 DIAGNOSIS — F329 Major depressive disorder, single episode, unspecified: Secondary | ICD-10-CM | POA: Diagnosis not present

## 2015-04-25 DIAGNOSIS — I8312 Varicose veins of left lower extremity with inflammation: Secondary | ICD-10-CM | POA: Diagnosis not present

## 2015-04-25 DIAGNOSIS — M545 Low back pain: Secondary | ICD-10-CM | POA: Diagnosis not present

## 2015-04-25 DIAGNOSIS — M1990 Unspecified osteoarthritis, unspecified site: Secondary | ICD-10-CM | POA: Diagnosis not present

## 2015-04-25 DIAGNOSIS — D696 Thrombocytopenia, unspecified: Secondary | ICD-10-CM | POA: Diagnosis not present

## 2015-04-25 DIAGNOSIS — M461 Sacroiliitis, not elsewhere classified: Secondary | ICD-10-CM | POA: Diagnosis not present

## 2015-04-25 DIAGNOSIS — I8311 Varicose veins of right lower extremity with inflammation: Secondary | ICD-10-CM | POA: Diagnosis not present

## 2015-04-25 DIAGNOSIS — I272 Other secondary pulmonary hypertension: Secondary | ICD-10-CM | POA: Diagnosis not present

## 2015-04-25 DIAGNOSIS — I87309 Chronic venous hypertension (idiopathic) without complications of unspecified lower extremity: Secondary | ICD-10-CM | POA: Diagnosis not present

## 2015-04-25 DIAGNOSIS — M791 Myalgia: Secondary | ICD-10-CM | POA: Diagnosis not present

## 2015-04-25 DIAGNOSIS — L97922 Non-pressure chronic ulcer of unspecified part of left lower leg with fat layer exposed: Secondary | ICD-10-CM | POA: Diagnosis not present

## 2015-04-25 DIAGNOSIS — H3530 Unspecified macular degeneration: Secondary | ICD-10-CM | POA: Diagnosis not present

## 2015-04-25 DIAGNOSIS — I4891 Unspecified atrial fibrillation: Secondary | ICD-10-CM | POA: Diagnosis not present

## 2015-04-25 DIAGNOSIS — I739 Peripheral vascular disease, unspecified: Secondary | ICD-10-CM | POA: Diagnosis not present

## 2015-04-25 DIAGNOSIS — G4700 Insomnia, unspecified: Secondary | ICD-10-CM | POA: Diagnosis not present

## 2015-04-25 DIAGNOSIS — H409 Unspecified glaucoma: Secondary | ICD-10-CM | POA: Diagnosis not present

## 2015-04-25 DIAGNOSIS — I509 Heart failure, unspecified: Secondary | ICD-10-CM | POA: Diagnosis not present

## 2015-04-25 DIAGNOSIS — L98499 Non-pressure chronic ulcer of skin of other sites with unspecified severity: Secondary | ICD-10-CM | POA: Diagnosis not present

## 2015-04-25 DIAGNOSIS — I10 Essential (primary) hypertension: Secondary | ICD-10-CM | POA: Diagnosis not present

## 2015-04-25 DIAGNOSIS — R0600 Dyspnea, unspecified: Secondary | ICD-10-CM | POA: Diagnosis not present

## 2015-04-25 DIAGNOSIS — R0609 Other forms of dyspnea: Secondary | ICD-10-CM | POA: Diagnosis not present

## 2015-04-25 DIAGNOSIS — R0602 Shortness of breath: Secondary | ICD-10-CM | POA: Diagnosis not present

## 2015-04-25 DIAGNOSIS — I517 Cardiomegaly: Secondary | ICD-10-CM | POA: Diagnosis not present

## 2015-04-25 DIAGNOSIS — I872 Venous insufficiency (chronic) (peripheral): Secondary | ICD-10-CM | POA: Diagnosis not present

## 2015-04-25 DIAGNOSIS — G8929 Other chronic pain: Secondary | ICD-10-CM | POA: Diagnosis not present

## 2015-04-25 DIAGNOSIS — I270 Primary pulmonary hypertension: Secondary | ICD-10-CM | POA: Diagnosis not present

## 2015-04-27 DIAGNOSIS — M461 Sacroiliitis, not elsewhere classified: Secondary | ICD-10-CM | POA: Diagnosis not present

## 2015-04-27 DIAGNOSIS — L03119 Cellulitis of unspecified part of limb: Secondary | ICD-10-CM | POA: Diagnosis not present

## 2015-04-27 DIAGNOSIS — I5033 Acute on chronic diastolic (congestive) heart failure: Secondary | ICD-10-CM | POA: Diagnosis not present

## 2015-04-27 DIAGNOSIS — R262 Difficulty in walking, not elsewhere classified: Secondary | ICD-10-CM | POA: Diagnosis not present

## 2015-04-27 DIAGNOSIS — R5381 Other malaise: Secondary | ICD-10-CM | POA: Diagnosis not present

## 2015-04-27 DIAGNOSIS — M545 Low back pain: Secondary | ICD-10-CM | POA: Diagnosis not present

## 2015-04-27 DIAGNOSIS — M791 Myalgia: Secondary | ICD-10-CM | POA: Diagnosis not present

## 2015-04-28 DIAGNOSIS — I890 Lymphedema, not elsewhere classified: Secondary | ICD-10-CM | POA: Diagnosis not present

## 2015-04-28 DIAGNOSIS — I8312 Varicose veins of left lower extremity with inflammation: Secondary | ICD-10-CM | POA: Diagnosis not present

## 2015-04-28 DIAGNOSIS — L97922 Non-pressure chronic ulcer of unspecified part of left lower leg with fat layer exposed: Secondary | ICD-10-CM | POA: Diagnosis not present

## 2015-04-28 DIAGNOSIS — I872 Venous insufficiency (chronic) (peripheral): Secondary | ICD-10-CM | POA: Diagnosis not present

## 2015-04-28 DIAGNOSIS — I739 Peripheral vascular disease, unspecified: Secondary | ICD-10-CM | POA: Diagnosis not present

## 2015-04-28 DIAGNOSIS — I8311 Varicose veins of right lower extremity with inflammation: Secondary | ICD-10-CM | POA: Diagnosis not present

## 2015-05-02 DIAGNOSIS — I5033 Acute on chronic diastolic (congestive) heart failure: Secondary | ICD-10-CM | POA: Diagnosis not present

## 2015-05-02 DIAGNOSIS — R2242 Localized swelling, mass and lump, left lower limb: Secondary | ICD-10-CM | POA: Diagnosis not present

## 2015-05-02 DIAGNOSIS — I270 Primary pulmonary hypertension: Secondary | ICD-10-CM | POA: Diagnosis not present

## 2015-05-04 DIAGNOSIS — L97922 Non-pressure chronic ulcer of unspecified part of left lower leg with fat layer exposed: Secondary | ICD-10-CM | POA: Diagnosis not present

## 2015-05-04 DIAGNOSIS — I890 Lymphedema, not elsewhere classified: Secondary | ICD-10-CM | POA: Diagnosis not present

## 2015-05-04 DIAGNOSIS — I872 Venous insufficiency (chronic) (peripheral): Secondary | ICD-10-CM | POA: Diagnosis not present

## 2015-05-04 DIAGNOSIS — R600 Localized edema: Secondary | ICD-10-CM | POA: Diagnosis not present

## 2015-05-09 DIAGNOSIS — I5033 Acute on chronic diastolic (congestive) heart failure: Secondary | ICD-10-CM | POA: Diagnosis not present

## 2015-05-13 DIAGNOSIS — M549 Dorsalgia, unspecified: Secondary | ICD-10-CM | POA: Diagnosis not present

## 2015-05-13 DIAGNOSIS — I5032 Chronic diastolic (congestive) heart failure: Secondary | ICD-10-CM | POA: Diagnosis not present

## 2015-05-13 DIAGNOSIS — R0600 Dyspnea, unspecified: Secondary | ICD-10-CM | POA: Diagnosis not present

## 2015-05-13 DIAGNOSIS — I739 Peripheral vascular disease, unspecified: Secondary | ICD-10-CM | POA: Diagnosis not present

## 2015-05-13 DIAGNOSIS — R600 Localized edema: Secondary | ICD-10-CM | POA: Diagnosis not present

## 2015-05-13 DIAGNOSIS — M150 Primary generalized (osteo)arthritis: Secondary | ICD-10-CM | POA: Diagnosis not present

## 2015-05-13 DIAGNOSIS — I270 Primary pulmonary hypertension: Secondary | ICD-10-CM | POA: Diagnosis not present

## 2015-05-13 DIAGNOSIS — D696 Thrombocytopenia, unspecified: Secondary | ICD-10-CM | POA: Diagnosis not present

## 2015-05-13 DIAGNOSIS — G8929 Other chronic pain: Secondary | ICD-10-CM | POA: Diagnosis not present

## 2015-05-20 DIAGNOSIS — R0602 Shortness of breath: Secondary | ICD-10-CM | POA: Diagnosis not present

## 2015-05-20 DIAGNOSIS — R0609 Other forms of dyspnea: Secondary | ICD-10-CM | POA: Diagnosis not present

## 2015-05-30 DIAGNOSIS — M461 Sacroiliitis, not elsewhere classified: Secondary | ICD-10-CM | POA: Diagnosis not present

## 2015-05-30 DIAGNOSIS — M545 Low back pain: Secondary | ICD-10-CM | POA: Diagnosis not present

## 2015-06-10 DIAGNOSIS — I10 Essential (primary) hypertension: Secondary | ICD-10-CM | POA: Diagnosis not present

## 2015-06-14 DIAGNOSIS — I509 Heart failure, unspecified: Secondary | ICD-10-CM | POA: Diagnosis not present

## 2015-06-14 DIAGNOSIS — Z7982 Long term (current) use of aspirin: Secondary | ICD-10-CM | POA: Diagnosis not present

## 2015-06-14 DIAGNOSIS — I10 Essential (primary) hypertension: Secondary | ICD-10-CM | POA: Diagnosis not present

## 2015-06-14 DIAGNOSIS — M79609 Pain in unspecified limb: Secondary | ICD-10-CM | POA: Diagnosis not present

## 2015-06-14 DIAGNOSIS — I4891 Unspecified atrial fibrillation: Secondary | ICD-10-CM | POA: Diagnosis not present

## 2015-06-14 DIAGNOSIS — Z9889 Other specified postprocedural states: Secondary | ICD-10-CM | POA: Diagnosis not present

## 2015-06-14 DIAGNOSIS — Z79899 Other long term (current) drug therapy: Secondary | ICD-10-CM | POA: Diagnosis not present

## 2015-06-14 DIAGNOSIS — G8929 Other chronic pain: Secondary | ICD-10-CM | POA: Diagnosis not present

## 2015-06-14 DIAGNOSIS — I272 Other secondary pulmonary hypertension: Secondary | ICD-10-CM | POA: Diagnosis not present

## 2015-06-14 DIAGNOSIS — I739 Peripheral vascular disease, unspecified: Secondary | ICD-10-CM | POA: Diagnosis not present

## 2015-06-14 DIAGNOSIS — Z888 Allergy status to other drugs, medicaments and biological substances status: Secondary | ICD-10-CM | POA: Diagnosis not present

## 2015-06-14 DIAGNOSIS — R109 Unspecified abdominal pain: Secondary | ICD-10-CM | POA: Diagnosis not present

## 2015-06-14 DIAGNOSIS — Z880 Allergy status to penicillin: Secondary | ICD-10-CM | POA: Diagnosis not present

## 2015-06-14 DIAGNOSIS — M546 Pain in thoracic spine: Secondary | ICD-10-CM | POA: Diagnosis not present

## 2015-06-14 DIAGNOSIS — Z7951 Long term (current) use of inhaled steroids: Secondary | ICD-10-CM | POA: Diagnosis not present

## 2015-06-14 DIAGNOSIS — S22000A Wedge compression fracture of unspecified thoracic vertebra, initial encounter for closed fracture: Secondary | ICD-10-CM | POA: Diagnosis not present

## 2015-06-14 DIAGNOSIS — Z79891 Long term (current) use of opiate analgesic: Secondary | ICD-10-CM | POA: Diagnosis not present

## 2015-06-15 DIAGNOSIS — M150 Primary generalized (osteo)arthritis: Secondary | ICD-10-CM | POA: Diagnosis not present

## 2015-06-15 DIAGNOSIS — I5030 Unspecified diastolic (congestive) heart failure: Secondary | ICD-10-CM | POA: Diagnosis not present

## 2015-06-15 DIAGNOSIS — G4700 Insomnia, unspecified: Secondary | ICD-10-CM | POA: Diagnosis not present

## 2015-06-15 DIAGNOSIS — I479 Paroxysmal tachycardia, unspecified: Secondary | ICD-10-CM | POA: Diagnosis not present

## 2015-06-21 DIAGNOSIS — H409 Unspecified glaucoma: Secondary | ICD-10-CM | POA: Diagnosis not present

## 2015-06-21 DIAGNOSIS — I5032 Chronic diastolic (congestive) heart failure: Secondary | ICD-10-CM | POA: Diagnosis not present

## 2015-06-21 DIAGNOSIS — M545 Low back pain: Secondary | ICD-10-CM | POA: Diagnosis not present

## 2015-06-21 DIAGNOSIS — H3530 Unspecified macular degeneration: Secondary | ICD-10-CM | POA: Diagnosis not present

## 2015-06-21 DIAGNOSIS — J449 Chronic obstructive pulmonary disease, unspecified: Secondary | ICD-10-CM | POA: Diagnosis not present

## 2015-06-21 DIAGNOSIS — I10 Essential (primary) hypertension: Secondary | ICD-10-CM | POA: Diagnosis not present

## 2015-06-21 DIAGNOSIS — I872 Venous insufficiency (chronic) (peripheral): Secondary | ICD-10-CM | POA: Diagnosis not present

## 2015-06-21 DIAGNOSIS — I480 Paroxysmal atrial fibrillation: Secondary | ICD-10-CM | POA: Diagnosis not present

## 2015-06-21 DIAGNOSIS — I739 Peripheral vascular disease, unspecified: Secondary | ICD-10-CM | POA: Diagnosis not present

## 2015-06-21 DIAGNOSIS — M1990 Unspecified osteoarthritis, unspecified site: Secondary | ICD-10-CM | POA: Diagnosis not present

## 2015-06-24 DIAGNOSIS — I872 Venous insufficiency (chronic) (peripheral): Secondary | ICD-10-CM | POA: Diagnosis not present

## 2015-06-24 DIAGNOSIS — J449 Chronic obstructive pulmonary disease, unspecified: Secondary | ICD-10-CM | POA: Diagnosis not present

## 2015-06-24 DIAGNOSIS — I10 Essential (primary) hypertension: Secondary | ICD-10-CM | POA: Diagnosis not present

## 2015-06-24 DIAGNOSIS — I480 Paroxysmal atrial fibrillation: Secondary | ICD-10-CM | POA: Diagnosis not present

## 2015-06-24 DIAGNOSIS — I5032 Chronic diastolic (congestive) heart failure: Secondary | ICD-10-CM | POA: Diagnosis not present

## 2015-06-24 DIAGNOSIS — M1990 Unspecified osteoarthritis, unspecified site: Secondary | ICD-10-CM | POA: Diagnosis not present

## 2015-06-27 DIAGNOSIS — I872 Venous insufficiency (chronic) (peripheral): Secondary | ICD-10-CM | POA: Diagnosis not present

## 2015-06-27 DIAGNOSIS — I10 Essential (primary) hypertension: Secondary | ICD-10-CM | POA: Diagnosis not present

## 2015-06-27 DIAGNOSIS — I5032 Chronic diastolic (congestive) heart failure: Secondary | ICD-10-CM | POA: Diagnosis not present

## 2015-06-27 DIAGNOSIS — M1990 Unspecified osteoarthritis, unspecified site: Secondary | ICD-10-CM | POA: Diagnosis not present

## 2015-06-27 DIAGNOSIS — I480 Paroxysmal atrial fibrillation: Secondary | ICD-10-CM | POA: Diagnosis not present

## 2015-06-27 DIAGNOSIS — J449 Chronic obstructive pulmonary disease, unspecified: Secondary | ICD-10-CM | POA: Diagnosis not present

## 2015-06-28 DIAGNOSIS — M1990 Unspecified osteoarthritis, unspecified site: Secondary | ICD-10-CM | POA: Diagnosis not present

## 2015-06-28 DIAGNOSIS — I5032 Chronic diastolic (congestive) heart failure: Secondary | ICD-10-CM | POA: Diagnosis not present

## 2015-06-28 DIAGNOSIS — J449 Chronic obstructive pulmonary disease, unspecified: Secondary | ICD-10-CM | POA: Diagnosis not present

## 2015-06-28 DIAGNOSIS — I480 Paroxysmal atrial fibrillation: Secondary | ICD-10-CM | POA: Diagnosis not present

## 2015-06-28 DIAGNOSIS — I872 Venous insufficiency (chronic) (peripheral): Secondary | ICD-10-CM | POA: Diagnosis not present

## 2015-06-28 DIAGNOSIS — I10 Essential (primary) hypertension: Secondary | ICD-10-CM | POA: Diagnosis not present

## 2015-06-29 DIAGNOSIS — I872 Venous insufficiency (chronic) (peripheral): Secondary | ICD-10-CM | POA: Diagnosis not present

## 2015-06-29 DIAGNOSIS — I10 Essential (primary) hypertension: Secondary | ICD-10-CM | POA: Diagnosis not present

## 2015-06-29 DIAGNOSIS — M1990 Unspecified osteoarthritis, unspecified site: Secondary | ICD-10-CM | POA: Diagnosis not present

## 2015-06-29 DIAGNOSIS — I5032 Chronic diastolic (congestive) heart failure: Secondary | ICD-10-CM | POA: Diagnosis not present

## 2015-06-29 DIAGNOSIS — I480 Paroxysmal atrial fibrillation: Secondary | ICD-10-CM | POA: Diagnosis not present

## 2015-06-29 DIAGNOSIS — J449 Chronic obstructive pulmonary disease, unspecified: Secondary | ICD-10-CM | POA: Diagnosis not present

## 2015-06-30 DIAGNOSIS — J449 Chronic obstructive pulmonary disease, unspecified: Secondary | ICD-10-CM | POA: Diagnosis not present

## 2015-06-30 DIAGNOSIS — I480 Paroxysmal atrial fibrillation: Secondary | ICD-10-CM | POA: Diagnosis not present

## 2015-06-30 DIAGNOSIS — I10 Essential (primary) hypertension: Secondary | ICD-10-CM | POA: Diagnosis not present

## 2015-06-30 DIAGNOSIS — I5032 Chronic diastolic (congestive) heart failure: Secondary | ICD-10-CM | POA: Diagnosis not present

## 2015-06-30 DIAGNOSIS — M1990 Unspecified osteoarthritis, unspecified site: Secondary | ICD-10-CM | POA: Diagnosis not present

## 2015-06-30 DIAGNOSIS — I872 Venous insufficiency (chronic) (peripheral): Secondary | ICD-10-CM | POA: Diagnosis not present

## 2015-07-01 DIAGNOSIS — I10 Essential (primary) hypertension: Secondary | ICD-10-CM | POA: Diagnosis not present

## 2015-07-01 DIAGNOSIS — I5032 Chronic diastolic (congestive) heart failure: Secondary | ICD-10-CM | POA: Diagnosis not present

## 2015-07-01 DIAGNOSIS — J449 Chronic obstructive pulmonary disease, unspecified: Secondary | ICD-10-CM | POA: Diagnosis not present

## 2015-07-01 DIAGNOSIS — I872 Venous insufficiency (chronic) (peripheral): Secondary | ICD-10-CM | POA: Diagnosis not present

## 2015-07-01 DIAGNOSIS — I480 Paroxysmal atrial fibrillation: Secondary | ICD-10-CM | POA: Diagnosis not present

## 2015-07-01 DIAGNOSIS — M1990 Unspecified osteoarthritis, unspecified site: Secondary | ICD-10-CM | POA: Diagnosis not present

## 2015-07-04 DIAGNOSIS — J449 Chronic obstructive pulmonary disease, unspecified: Secondary | ICD-10-CM | POA: Diagnosis not present

## 2015-07-04 DIAGNOSIS — I480 Paroxysmal atrial fibrillation: Secondary | ICD-10-CM | POA: Diagnosis not present

## 2015-07-04 DIAGNOSIS — M1990 Unspecified osteoarthritis, unspecified site: Secondary | ICD-10-CM | POA: Diagnosis not present

## 2015-07-04 DIAGNOSIS — I5032 Chronic diastolic (congestive) heart failure: Secondary | ICD-10-CM | POA: Diagnosis not present

## 2015-07-04 DIAGNOSIS — I10 Essential (primary) hypertension: Secondary | ICD-10-CM | POA: Diagnosis not present

## 2015-07-04 DIAGNOSIS — I872 Venous insufficiency (chronic) (peripheral): Secondary | ICD-10-CM | POA: Diagnosis not present

## 2015-07-05 DIAGNOSIS — I5032 Chronic diastolic (congestive) heart failure: Secondary | ICD-10-CM | POA: Diagnosis not present

## 2015-07-05 DIAGNOSIS — I480 Paroxysmal atrial fibrillation: Secondary | ICD-10-CM | POA: Diagnosis not present

## 2015-07-05 DIAGNOSIS — M1990 Unspecified osteoarthritis, unspecified site: Secondary | ICD-10-CM | POA: Diagnosis not present

## 2015-07-05 DIAGNOSIS — I872 Venous insufficiency (chronic) (peripheral): Secondary | ICD-10-CM | POA: Diagnosis not present

## 2015-07-05 DIAGNOSIS — J449 Chronic obstructive pulmonary disease, unspecified: Secondary | ICD-10-CM | POA: Diagnosis not present

## 2015-07-05 DIAGNOSIS — I10 Essential (primary) hypertension: Secondary | ICD-10-CM | POA: Diagnosis not present

## 2015-07-06 DIAGNOSIS — I872 Venous insufficiency (chronic) (peripheral): Secondary | ICD-10-CM | POA: Diagnosis not present

## 2015-07-06 DIAGNOSIS — I10 Essential (primary) hypertension: Secondary | ICD-10-CM | POA: Diagnosis not present

## 2015-07-06 DIAGNOSIS — M1990 Unspecified osteoarthritis, unspecified site: Secondary | ICD-10-CM | POA: Diagnosis not present

## 2015-07-06 DIAGNOSIS — I5032 Chronic diastolic (congestive) heart failure: Secondary | ICD-10-CM | POA: Diagnosis not present

## 2015-07-06 DIAGNOSIS — I480 Paroxysmal atrial fibrillation: Secondary | ICD-10-CM | POA: Diagnosis not present

## 2015-07-06 DIAGNOSIS — J449 Chronic obstructive pulmonary disease, unspecified: Secondary | ICD-10-CM | POA: Diagnosis not present

## 2015-07-07 DIAGNOSIS — I872 Venous insufficiency (chronic) (peripheral): Secondary | ICD-10-CM | POA: Diagnosis not present

## 2015-07-07 DIAGNOSIS — J449 Chronic obstructive pulmonary disease, unspecified: Secondary | ICD-10-CM | POA: Diagnosis not present

## 2015-07-07 DIAGNOSIS — I5032 Chronic diastolic (congestive) heart failure: Secondary | ICD-10-CM | POA: Diagnosis not present

## 2015-07-07 DIAGNOSIS — M1990 Unspecified osteoarthritis, unspecified site: Secondary | ICD-10-CM | POA: Diagnosis not present

## 2015-07-07 DIAGNOSIS — I10 Essential (primary) hypertension: Secondary | ICD-10-CM | POA: Diagnosis not present

## 2015-07-07 DIAGNOSIS — I480 Paroxysmal atrial fibrillation: Secondary | ICD-10-CM | POA: Diagnosis not present

## 2015-07-08 DIAGNOSIS — Z7901 Long term (current) use of anticoagulants: Secondary | ICD-10-CM | POA: Diagnosis not present

## 2015-07-08 DIAGNOSIS — M549 Dorsalgia, unspecified: Secondary | ICD-10-CM | POA: Diagnosis not present

## 2015-07-08 DIAGNOSIS — G894 Chronic pain syndrome: Secondary | ICD-10-CM | POA: Diagnosis not present

## 2015-07-08 DIAGNOSIS — M810 Age-related osteoporosis without current pathological fracture: Secondary | ICD-10-CM | POA: Diagnosis not present

## 2015-07-08 DIAGNOSIS — D473 Essential (hemorrhagic) thrombocythemia: Secondary | ICD-10-CM | POA: Diagnosis not present

## 2015-07-08 DIAGNOSIS — I4891 Unspecified atrial fibrillation: Secondary | ICD-10-CM | POA: Diagnosis not present

## 2015-07-08 DIAGNOSIS — D72819 Decreased white blood cell count, unspecified: Secondary | ICD-10-CM | POA: Diagnosis not present

## 2015-07-08 DIAGNOSIS — E785 Hyperlipidemia, unspecified: Secondary | ICD-10-CM | POA: Diagnosis not present

## 2015-07-08 DIAGNOSIS — I872 Venous insufficiency (chronic) (peripheral): Secondary | ICD-10-CM | POA: Diagnosis not present

## 2015-07-08 DIAGNOSIS — I739 Peripheral vascular disease, unspecified: Secondary | ICD-10-CM | POA: Diagnosis not present

## 2015-07-08 DIAGNOSIS — M150 Primary generalized (osteo)arthritis: Secondary | ICD-10-CM | POA: Diagnosis not present

## 2015-07-08 DIAGNOSIS — I83025 Varicose veins of left lower extremity with ulcer other part of foot: Secondary | ICD-10-CM | POA: Diagnosis not present

## 2015-07-08 DIAGNOSIS — R0609 Other forms of dyspnea: Secondary | ICD-10-CM | POA: Diagnosis not present

## 2015-07-08 DIAGNOSIS — R600 Localized edema: Secondary | ICD-10-CM | POA: Diagnosis not present

## 2015-07-08 DIAGNOSIS — Z79891 Long term (current) use of opiate analgesic: Secondary | ICD-10-CM | POA: Diagnosis not present

## 2015-07-08 DIAGNOSIS — H3530 Unspecified macular degeneration: Secondary | ICD-10-CM | POA: Diagnosis not present

## 2015-07-08 DIAGNOSIS — Z888 Allergy status to other drugs, medicaments and biological substances status: Secondary | ICD-10-CM | POA: Diagnosis not present

## 2015-07-08 DIAGNOSIS — E042 Nontoxic multinodular goiter: Secondary | ICD-10-CM | POA: Diagnosis not present

## 2015-07-08 DIAGNOSIS — R630 Anorexia: Secondary | ICD-10-CM | POA: Diagnosis not present

## 2015-07-08 DIAGNOSIS — I5030 Unspecified diastolic (congestive) heart failure: Secondary | ICD-10-CM | POA: Diagnosis not present

## 2015-07-08 DIAGNOSIS — K5901 Slow transit constipation: Secondary | ICD-10-CM | POA: Diagnosis not present

## 2015-07-08 DIAGNOSIS — Z79899 Other long term (current) drug therapy: Secondary | ICD-10-CM | POA: Diagnosis not present

## 2015-07-08 DIAGNOSIS — R2681 Unsteadiness on feet: Secondary | ICD-10-CM | POA: Diagnosis not present

## 2015-07-08 DIAGNOSIS — I10 Essential (primary) hypertension: Secondary | ICD-10-CM | POA: Diagnosis not present

## 2015-07-08 DIAGNOSIS — I272 Other secondary pulmonary hypertension: Secondary | ICD-10-CM | POA: Diagnosis not present

## 2015-07-08 DIAGNOSIS — N179 Acute kidney failure, unspecified: Secondary | ICD-10-CM | POA: Diagnosis not present

## 2015-07-08 DIAGNOSIS — Z7982 Long term (current) use of aspirin: Secondary | ICD-10-CM | POA: Diagnosis not present

## 2015-07-08 DIAGNOSIS — L97529 Non-pressure chronic ulcer of other part of left foot with unspecified severity: Secondary | ICD-10-CM | POA: Diagnosis not present

## 2015-07-11 DIAGNOSIS — I10 Essential (primary) hypertension: Secondary | ICD-10-CM | POA: Diagnosis not present

## 2015-07-11 DIAGNOSIS — I480 Paroxysmal atrial fibrillation: Secondary | ICD-10-CM | POA: Diagnosis not present

## 2015-07-11 DIAGNOSIS — I872 Venous insufficiency (chronic) (peripheral): Secondary | ICD-10-CM | POA: Diagnosis not present

## 2015-07-11 DIAGNOSIS — M1990 Unspecified osteoarthritis, unspecified site: Secondary | ICD-10-CM | POA: Diagnosis not present

## 2015-07-11 DIAGNOSIS — J449 Chronic obstructive pulmonary disease, unspecified: Secondary | ICD-10-CM | POA: Diagnosis not present

## 2015-07-11 DIAGNOSIS — I5032 Chronic diastolic (congestive) heart failure: Secondary | ICD-10-CM | POA: Diagnosis not present

## 2015-07-12 DIAGNOSIS — I5032 Chronic diastolic (congestive) heart failure: Secondary | ICD-10-CM | POA: Diagnosis not present

## 2015-07-12 DIAGNOSIS — I10 Essential (primary) hypertension: Secondary | ICD-10-CM | POA: Diagnosis not present

## 2015-07-12 DIAGNOSIS — J449 Chronic obstructive pulmonary disease, unspecified: Secondary | ICD-10-CM | POA: Diagnosis not present

## 2015-07-12 DIAGNOSIS — I480 Paroxysmal atrial fibrillation: Secondary | ICD-10-CM | POA: Diagnosis not present

## 2015-07-12 DIAGNOSIS — I872 Venous insufficiency (chronic) (peripheral): Secondary | ICD-10-CM | POA: Diagnosis not present

## 2015-07-12 DIAGNOSIS — M1990 Unspecified osteoarthritis, unspecified site: Secondary | ICD-10-CM | POA: Diagnosis not present

## 2015-07-13 DIAGNOSIS — I480 Paroxysmal atrial fibrillation: Secondary | ICD-10-CM | POA: Diagnosis not present

## 2015-07-13 DIAGNOSIS — I872 Venous insufficiency (chronic) (peripheral): Secondary | ICD-10-CM | POA: Diagnosis not present

## 2015-07-13 DIAGNOSIS — J449 Chronic obstructive pulmonary disease, unspecified: Secondary | ICD-10-CM | POA: Diagnosis not present

## 2015-07-13 DIAGNOSIS — I5032 Chronic diastolic (congestive) heart failure: Secondary | ICD-10-CM | POA: Diagnosis not present

## 2015-07-13 DIAGNOSIS — M1990 Unspecified osteoarthritis, unspecified site: Secondary | ICD-10-CM | POA: Diagnosis not present

## 2015-07-13 DIAGNOSIS — I10 Essential (primary) hypertension: Secondary | ICD-10-CM | POA: Diagnosis not present

## 2015-07-15 DIAGNOSIS — I872 Venous insufficiency (chronic) (peripheral): Secondary | ICD-10-CM | POA: Diagnosis not present

## 2015-07-15 DIAGNOSIS — I480 Paroxysmal atrial fibrillation: Secondary | ICD-10-CM | POA: Diagnosis not present

## 2015-07-15 DIAGNOSIS — I10 Essential (primary) hypertension: Secondary | ICD-10-CM | POA: Diagnosis not present

## 2015-07-15 DIAGNOSIS — M1990 Unspecified osteoarthritis, unspecified site: Secondary | ICD-10-CM | POA: Diagnosis not present

## 2015-07-15 DIAGNOSIS — J449 Chronic obstructive pulmonary disease, unspecified: Secondary | ICD-10-CM | POA: Diagnosis not present

## 2015-07-15 DIAGNOSIS — I5032 Chronic diastolic (congestive) heart failure: Secondary | ICD-10-CM | POA: Diagnosis not present

## 2015-07-18 DIAGNOSIS — J449 Chronic obstructive pulmonary disease, unspecified: Secondary | ICD-10-CM | POA: Diagnosis not present

## 2015-07-18 DIAGNOSIS — I872 Venous insufficiency (chronic) (peripheral): Secondary | ICD-10-CM | POA: Diagnosis not present

## 2015-07-18 DIAGNOSIS — I480 Paroxysmal atrial fibrillation: Secondary | ICD-10-CM | POA: Diagnosis not present

## 2015-07-18 DIAGNOSIS — I10 Essential (primary) hypertension: Secondary | ICD-10-CM | POA: Diagnosis not present

## 2015-07-18 DIAGNOSIS — I5032 Chronic diastolic (congestive) heart failure: Secondary | ICD-10-CM | POA: Diagnosis not present

## 2015-07-18 DIAGNOSIS — M1990 Unspecified osteoarthritis, unspecified site: Secondary | ICD-10-CM | POA: Diagnosis not present

## 2015-07-28 DIAGNOSIS — I5032 Chronic diastolic (congestive) heart failure: Secondary | ICD-10-CM | POA: Diagnosis not present

## 2015-07-28 DIAGNOSIS — I872 Venous insufficiency (chronic) (peripheral): Secondary | ICD-10-CM | POA: Diagnosis not present

## 2015-07-28 DIAGNOSIS — I10 Essential (primary) hypertension: Secondary | ICD-10-CM | POA: Diagnosis not present

## 2015-07-28 DIAGNOSIS — I480 Paroxysmal atrial fibrillation: Secondary | ICD-10-CM | POA: Diagnosis not present

## 2015-07-28 DIAGNOSIS — J449 Chronic obstructive pulmonary disease, unspecified: Secondary | ICD-10-CM | POA: Diagnosis not present

## 2015-07-28 DIAGNOSIS — M1990 Unspecified osteoarthritis, unspecified site: Secondary | ICD-10-CM | POA: Diagnosis not present

## 2015-07-29 DIAGNOSIS — I10 Essential (primary) hypertension: Secondary | ICD-10-CM | POA: Diagnosis not present

## 2015-07-29 DIAGNOSIS — J449 Chronic obstructive pulmonary disease, unspecified: Secondary | ICD-10-CM | POA: Diagnosis not present

## 2015-07-29 DIAGNOSIS — I872 Venous insufficiency (chronic) (peripheral): Secondary | ICD-10-CM | POA: Diagnosis not present

## 2015-07-29 DIAGNOSIS — I480 Paroxysmal atrial fibrillation: Secondary | ICD-10-CM | POA: Diagnosis not present

## 2015-07-29 DIAGNOSIS — I5032 Chronic diastolic (congestive) heart failure: Secondary | ICD-10-CM | POA: Diagnosis not present

## 2015-07-29 DIAGNOSIS — M1990 Unspecified osteoarthritis, unspecified site: Secondary | ICD-10-CM | POA: Diagnosis not present

## 2015-08-08 DIAGNOSIS — I5032 Chronic diastolic (congestive) heart failure: Secondary | ICD-10-CM | POA: Diagnosis not present

## 2015-08-08 DIAGNOSIS — I10 Essential (primary) hypertension: Secondary | ICD-10-CM | POA: Diagnosis not present

## 2015-08-08 DIAGNOSIS — J449 Chronic obstructive pulmonary disease, unspecified: Secondary | ICD-10-CM | POA: Diagnosis not present

## 2015-08-08 DIAGNOSIS — M1990 Unspecified osteoarthritis, unspecified site: Secondary | ICD-10-CM | POA: Diagnosis not present

## 2015-08-08 DIAGNOSIS — I872 Venous insufficiency (chronic) (peripheral): Secondary | ICD-10-CM | POA: Diagnosis not present

## 2015-08-08 DIAGNOSIS — I480 Paroxysmal atrial fibrillation: Secondary | ICD-10-CM | POA: Diagnosis not present

## 2015-08-09 DIAGNOSIS — J449 Chronic obstructive pulmonary disease, unspecified: Secondary | ICD-10-CM | POA: Diagnosis not present

## 2015-08-09 DIAGNOSIS — M1990 Unspecified osteoarthritis, unspecified site: Secondary | ICD-10-CM | POA: Diagnosis not present

## 2015-08-09 DIAGNOSIS — I872 Venous insufficiency (chronic) (peripheral): Secondary | ICD-10-CM | POA: Diagnosis not present

## 2015-08-09 DIAGNOSIS — I480 Paroxysmal atrial fibrillation: Secondary | ICD-10-CM | POA: Diagnosis not present

## 2015-08-09 DIAGNOSIS — I5032 Chronic diastolic (congestive) heart failure: Secondary | ICD-10-CM | POA: Diagnosis not present

## 2015-08-09 DIAGNOSIS — I10 Essential (primary) hypertension: Secondary | ICD-10-CM | POA: Diagnosis not present

## 2015-08-10 DIAGNOSIS — Z961 Presence of intraocular lens: Secondary | ICD-10-CM | POA: Diagnosis not present

## 2015-08-10 DIAGNOSIS — H3531 Nonexudative age-related macular degeneration: Secondary | ICD-10-CM | POA: Diagnosis not present

## 2015-08-10 DIAGNOSIS — Z7901 Long term (current) use of anticoagulants: Secondary | ICD-10-CM | POA: Diagnosis not present

## 2015-08-10 DIAGNOSIS — Z7982 Long term (current) use of aspirin: Secondary | ICD-10-CM | POA: Diagnosis not present

## 2015-09-08 ENCOUNTER — Emergency Department (HOSPITAL_COMMUNITY): Payer: Medicare Other

## 2015-09-08 ENCOUNTER — Encounter (HOSPITAL_COMMUNITY): Payer: Self-pay | Admitting: Emergency Medicine

## 2015-09-08 ENCOUNTER — Inpatient Hospital Stay (HOSPITAL_COMMUNITY)
Admission: EM | Admit: 2015-09-08 | Discharge: 2015-09-13 | DRG: 481 | Disposition: A | Discharge status: Skilled Nursing Facility | Payer: Medicare Other | Attending: Internal Medicine | Admitting: Internal Medicine

## 2015-09-08 DIAGNOSIS — I5032 Chronic diastolic (congestive) heart failure: Secondary | ICD-10-CM | POA: Diagnosis present

## 2015-09-08 DIAGNOSIS — T148XXA Other injury of unspecified body region, initial encounter: Secondary | ICD-10-CM

## 2015-09-08 DIAGNOSIS — N390 Urinary tract infection, site not specified: Secondary | ICD-10-CM | POA: Diagnosis not present

## 2015-09-08 DIAGNOSIS — M79605 Pain in left leg: Secondary | ICD-10-CM | POA: Diagnosis not present

## 2015-09-08 DIAGNOSIS — D62 Acute posthemorrhagic anemia: Secondary | ICD-10-CM | POA: Diagnosis not present

## 2015-09-08 DIAGNOSIS — W19XXXA Unspecified fall, initial encounter: Secondary | ICD-10-CM

## 2015-09-08 DIAGNOSIS — Z09 Encounter for follow-up examination after completed treatment for conditions other than malignant neoplasm: Secondary | ICD-10-CM

## 2015-09-08 DIAGNOSIS — W1839XA Other fall on same level, initial encounter: Secondary | ICD-10-CM | POA: Diagnosis present

## 2015-09-08 DIAGNOSIS — S72142A Displaced intertrochanteric fracture of left femur, initial encounter for closed fracture: Secondary | ICD-10-CM | POA: Diagnosis not present

## 2015-09-08 DIAGNOSIS — Z79891 Long term (current) use of opiate analgesic: Secondary | ICD-10-CM

## 2015-09-08 DIAGNOSIS — S72102A Unspecified trochanteric fracture of left femur, initial encounter for closed fracture: Secondary | ICD-10-CM

## 2015-09-08 DIAGNOSIS — T148 Other injury of unspecified body region: Secondary | ICD-10-CM | POA: Diagnosis not present

## 2015-09-08 DIAGNOSIS — S0990XA Unspecified injury of head, initial encounter: Secondary | ICD-10-CM | POA: Diagnosis not present

## 2015-09-08 DIAGNOSIS — S72002A Fracture of unspecified part of neck of left femur, initial encounter for closed fracture: Secondary | ICD-10-CM | POA: Diagnosis not present

## 2015-09-08 DIAGNOSIS — S72109A Unspecified trochanteric fracture of unspecified femur, initial encounter for closed fracture: Secondary | ICD-10-CM | POA: Insufficient documentation

## 2015-09-08 DIAGNOSIS — I10 Essential (primary) hypertension: Secondary | ICD-10-CM

## 2015-09-08 DIAGNOSIS — J42 Unspecified chronic bronchitis: Secondary | ICD-10-CM

## 2015-09-08 DIAGNOSIS — Z79899 Other long term (current) drug therapy: Secondary | ICD-10-CM

## 2015-09-08 DIAGNOSIS — S72042A Displaced fracture of base of neck of left femur, initial encounter for closed fracture: Principal | ICD-10-CM | POA: Diagnosis present

## 2015-09-08 DIAGNOSIS — Z7982 Long term (current) use of aspirin: Secondary | ICD-10-CM

## 2015-09-08 DIAGNOSIS — Z880 Allergy status to penicillin: Secondary | ICD-10-CM

## 2015-09-08 DIAGNOSIS — Z888 Allergy status to other drugs, medicaments and biological substances status: Secondary | ICD-10-CM

## 2015-09-08 DIAGNOSIS — I959 Hypotension, unspecified: Secondary | ICD-10-CM | POA: Diagnosis not present

## 2015-09-08 DIAGNOSIS — J984 Other disorders of lung: Secondary | ICD-10-CM | POA: Diagnosis not present

## 2015-09-08 DIAGNOSIS — I4819 Other persistent atrial fibrillation: Secondary | ICD-10-CM

## 2015-09-08 DIAGNOSIS — I110 Hypertensive heart disease with heart failure: Secondary | ICD-10-CM | POA: Diagnosis not present

## 2015-09-08 DIAGNOSIS — Z66 Do not resuscitate: Secondary | ICD-10-CM | POA: Diagnosis present

## 2015-09-08 DIAGNOSIS — Z809 Family history of malignant neoplasm, unspecified: Secondary | ICD-10-CM

## 2015-09-08 DIAGNOSIS — J449 Chronic obstructive pulmonary disease, unspecified: Secondary | ICD-10-CM | POA: Diagnosis present

## 2015-09-08 DIAGNOSIS — M25552 Pain in left hip: Secondary | ICD-10-CM | POA: Diagnosis not present

## 2015-09-08 DIAGNOSIS — I4891 Unspecified atrial fibrillation: Secondary | ICD-10-CM | POA: Diagnosis present

## 2015-09-08 HISTORY — DX: Chronic obstructive pulmonary disease, unspecified: J44.9

## 2015-09-08 HISTORY — DX: Essential (primary) hypertension: I10

## 2015-09-08 HISTORY — DX: Heart failure, unspecified: I50.9

## 2015-09-08 HISTORY — DX: Unspecified atrial fibrillation: I48.91

## 2015-09-08 LAB — CBC WITH DIFFERENTIAL/PLATELET
BASOS ABS: 0 10*3/uL (ref 0.0–0.1)
BASOS PCT: 1 %
Eosinophils Absolute: 0.1 10*3/uL (ref 0.0–0.7)
Eosinophils Relative: 1 %
HCT: 38.8 % (ref 36.0–46.0)
Hemoglobin: 12.8 g/dL (ref 12.0–15.0)
Lymphocytes Relative: 13 %
Lymphs Abs: 1.1 10*3/uL (ref 0.7–4.0)
MCH: 32.9 pg (ref 26.0–34.0)
MCHC: 33 g/dL (ref 30.0–36.0)
MCV: 99.7 fL (ref 78.0–100.0)
MONO ABS: 0.8 10*3/uL (ref 0.1–1.0)
Monocytes Relative: 9 %
Neutro Abs: 6.5 10*3/uL (ref 1.7–7.7)
Neutrophils Relative %: 76 %
PLATELETS: 128 10*3/uL — AB (ref 150–400)
RBC: 3.89 MIL/uL (ref 3.87–5.11)
RDW: 18.2 % — AB (ref 11.5–15.5)
WBC: 8.6 10*3/uL (ref 4.0–10.5)

## 2015-09-08 LAB — PROTIME-INR
INR: 1.14 (ref 0.00–1.49)
Prothrombin Time: 14.8 seconds (ref 11.6–15.2)

## 2015-09-08 LAB — APTT: aPTT: 35 seconds (ref 24–37)

## 2015-09-08 LAB — I-STAT TROPONIN, ED: TROPONIN I, POC: 0.02 ng/mL (ref 0.00–0.08)

## 2015-09-08 NOTE — ED Notes (Signed)
Patient transported to X-ray 

## 2015-09-08 NOTE — ED Notes (Signed)
Per EMS pt is from home and was carrying a basket and lost her balance and fell backward onto her left hip and leaned up against the wall   Pt denies LOC, neck or back pain  Pt has pain in her left upper femur area on the lateral side  Pt states it hurts to move her leg  Pt has swelling noted, outward rotation, no shortening noted  Pt has edema noted to her left foot but pt states it has been like that for a couple weeks   Pt rates pain a 5/10 on pain scale

## 2015-09-08 NOTE — ED Provider Notes (Signed)
CSN: [id number]   Arrival date & time 09/08/15 2255  History  By signing my name below, I, Altamease Oiler, attest that this documentation has been prepared under the direction and in the presence of Julianne Rice, MD. Electronically Signed: Altamease Oiler, ED Scribe. 09/09/2015. 12:53 AM.  Chief Complaint  Patient presents with  . Fall    HPI The history is provided by the patient. No language interpreter was used.   Mariah [name] is a 81 y.o. female who presents to the Emergency Department complaining of a fall 1 hour ago. The pt was carrying a basket and lost balance falling backwards.  Pt states that she struck her head on a doorway. She usually ambulates with a walker but was not using it tonight. Associated symptoms include left hip pain and nausea. The hip pain is not severe when she is resting but is exacerbated by movement.  Pt denies LOC, abdominal pain, neck pain, and back pain.Marland Kitchen She was admitted to the hospital 2 weeks ago and treated for dehydration. At that time her fluid pills were stopped and she has had some leg swelling since. No SOB.   Past Medical History  Diagnosis Date  . COPD (chronic obstructive pulmonary disease) ([place])   . Atrial fibrillation ([place])   . Hypertension     Past Surgical History  Procedure Laterality Date  . Hernia repair      No family history on file.  Social History  Substance Use Topics  . Smoking status: Never Smoker   . Smokeless tobacco: None  . Alcohol Use: No     Review of Systems  Constitutional: Negative for fever and chills.  HENT: Negative for facial swelling.   Respiratory: Negative for cough and shortness of breath.   Cardiovascular: Negative for chest pain.  Gastrointestinal: Negative for nausea, vomiting, abdominal pain, diarrhea and constipation.  Genitourinary: Negative for dysuria, flank pain and difficulty urinating.  Musculoskeletal: Positive for arthralgias. Negative for back pain.  Skin: Negative for rash.   Neurological: Negative for dizziness, syncope, weakness, light-headedness, numbness and headaches.  All other systems reviewed and are negative.  Home Medications   Prior to Admission medications   Medication Sig Start Date End Date Taking? Authorizing Provider  acetaminophen (TYLENOL) 500 MG tablet Take 1,000 mg by mouth 3 (three) times daily.   Yes Historical Provider, MD  albuterol (PROVENTIL) (2.5 MG/3ML) 0.083% nebulizer solution Take 2.5 mg by nebulization every 6 (six) hours as needed for wheezing or shortness of breath.   Yes Historical Provider, MD  aspirin EC 81 MG tablet Take 81 mg by mouth daily.   Yes Historical Provider, MD  brimonidine (ALPHAGAN P) 0.1 % SOLN Place 1 drop into both eyes 2 (two) times daily.   Yes Historical Provider, MD  carboxymethylcellulose (REFRESH PLUS) 0.5 % SOLN Place 2 drops into both eyes daily as needed (dry eyes).   Yes Historical Provider, MD  furosemide (LASIX) 40 MG tablet Take 40 mg by mouth daily.   Yes Historical Provider, MD  latanoprost (XALATAN) 0.005 % ophthalmic solution Place 1 drop into both eyes at bedtime.   Yes Historical Provider, MD  Melatonin 1 MG TABS Take 1 tablet by mouth daily.   Yes Historical Provider, MD  metoprolol succinate (TOPROL-XL) 50 MG 24 hr tablet Take 50 mg by mouth daily. Take with or immediately following a meal.   Yes Historical Provider, MD  mirtazapine (REMERON) 15 MG tablet Take 15 mg by mouth at bedtime as needed (sleep).  Yes Historical Provider, MD  PRESCRIPTION MEDICATION Apply 1 application topically 2 (two) times daily as needed (pain).   Yes Historical Provider, MD  spironolactone (ALDACTONE) 25 MG tablet Take 12.5 mg by mouth daily.   Yes Historical Provider, MD  traMADol (ULTRAM) 50 MG tablet Take 50 mg by mouth every 6 (six) hours as needed for moderate pain.   Yes Historical Provider, MD    Allergies  Lyrica and Penicillins  Triage Vitals: BP 146/77 mmHg  Pulse 97  Temp(Src) 97.9 F (36.6 C)  (Oral)  Resp 20  SpO2 96%  Physical Exam  Constitutional: She is oriented to person, place, and time. She appears well-developed and well-nourished. No distress.  HENT:  Head: Normocephalic and atraumatic.  Mouth/Throat: Oropharynx is clear and moist. No oropharyngeal exudate.  Eyes: EOM are normal. Pupils are equal, round, and reactive to light.  Neck: Normal range of motion. Neck supple.  No posterior midline cervical tenderness to palpation.  Cardiovascular: Normal rate and regular rhythm.  Exam reveals no gallop and no friction rub.   No murmur heard. Pulmonary/Chest: Effort normal and breath sounds normal. No respiratory distress. She has no wheezes. She has no rales. She exhibits no tenderness.  Abdominal: Soft. Bowel sounds are normal. She exhibits no distension and no mass. There is no tenderness. There is no rebound and no guarding.  Musculoskeletal: Normal range of motion. She exhibits tenderness. She exhibits no edema.  Tender to palpation over the left groin with decreased range of motion of the left hip due to pain. The left lower extremity is more edematous than the right. There is external rotation. No shortening appreciated. Distal pulses intact.  Neurological: She is alert and oriented to person, place, and time.  Moving toes on bilateral legs equally. Equal bilateral grip strength. Sensation is fully intact.   Skin: Skin is warm and dry. No rash noted. No erythema.  Psychiatric: She has a normal mood and affect. Her behavior is normal.  Nursing note and vitals reviewed.   ED Course  Procedures   DIAGNOSTIC STUDIES: Oxygen Saturation is 96% on RA, normal by my interpretation.    COORDINATION OF CARE: 11:11 PM Discussed treatment plan which includes CT head, left hip XR, CXR, EKG, and lab work with pt at bedside and pt agreed to plan.  12:52 AM I re-evaluated the patient and provided an update on the results of her imaging and the plan for admission to the hospital.    Labs Reviewed  CBC WITH DIFFERENTIAL/PLATELET - Abnormal; Notable for the following:    RDW 18.2 (*)    Platelets 128 (*)    All other components within normal limits  COMPREHENSIVE METABOLIC PANEL - Abnormal; Notable for the following:    Chloride 100 (*)    Glucose, Bld 117 (*)    BUN 23 (*)    ALT 11 (*)    Total Bilirubin 1.3 (*)    GFR calc non Af Amer 55 (*)    All other components within normal limits  PROTIME-INR  APTT  URINALYSIS, ROUTINE W REFLEX MICROSCOPIC (NOT AT [facility])  Randolm Idol, ED    Imaging Review Dg Chest 2 View  09/08/2015  CLINICAL DATA:  Hip fracture EXAM: CHEST - 2 VIEW COMPARISON:  None. FINDINGS: Cardiac shadow is enlarged. The thoracic aorta is tortuous with calcification. No aneurysmal dilatation is seen. Blunting of left costophrenic angle is noted which may represent a small effusion but may be chronic in nature. No acute bony  abnormality is seen. IMPRESSION: Blunting of left costophrenic angle. Cardiomegaly. Electronically Signed   By: Inez Catalina M.D.   On: 09/08/2015 23:52   Ct Head Wo Contrast  09/08/2015  CLINICAL DATA:  Fall today with blunt head trauma EXAM: CT HEAD WITHOUT CONTRAST TECHNIQUE: Contiguous axial images were obtained from the base of the skull through the vertex without intravenous contrast. COMPARISON:  None. FINDINGS: Bony calvarium is intact. No gross soft tissue abnormality is noted. Mild atrophic changes are seen. No findings to suggest acute hemorrhage, acute infarction or space-occupying mass lesion are noted. Changes of chronic white matter ischemic change are noted as well. IMPRESSION: Mild chronic atrophic and ischemic changes without acute abnormality. Electronically Signed   By: Inez Catalina M.D.   On: 09/08/2015 23:37   Dg Hip Unilat With Pelvis 2-3 Views Left  09/08/2015  CLINICAL DATA:  Fall today with left hip pain, initial encounter EXAM: DG HIP (WITH OR WITHOUT PELVIS) 2-3V LEFT COMPARISON:  None. FINDINGS:  There is a comminuted fracture through the intratrochanteric region with impaction and angulation at the fracture site. The pelvic ring is intact. Calcified uterine fibroid is noted. IMPRESSION: Comminuted fracture of the proximal left femur. Electronically Signed   By: Inez Catalina M.D.   On: 09/08/2015 23:53    I personally reviewed and evaluated these images and lab results as a part of my medical decision-making.   EKG Interpretation  Date/Time:  Friday September 09 2015 00:10:08 EDT Ventricular Rate:  85 PR Interval:    QRS Duration: 108 QT Interval:  395 QTC Calculation: 470 R Axis:   111 Text Interpretation:  Atrial fibrillation Probable RVH w/ secondary repol abnormality Nonspecific T abnormalities, lateral leads Confirmed by Lita Mains  MD, Kaleeyah Cuffie ([id number]) on 09/09/2015 12:58:21 AM    MDM   Final diagnoses:  Intertrochanteric fracture of left hip, closed, initial encounter ([place])     I, Bonnee Zertuche, personally performed the services described in this documentation. All medical record entries made by the scribe were at my direction and in my presence.  I have reviewed the chart and discharge instructions and agree that the record reflects my personal performance and is accurate and complete. Plez Belton.  09/09/2015. 1:00 AM.   Discussed with Dr. Percell Miller. Advised to keep the patient NPO. Will operate tomorrow.  Dr. Blaine Hamper will admit to telemetry bed.  Julianne Rice, MD 09/09/15 0100

## 2015-09-09 ENCOUNTER — Inpatient Hospital Stay (HOSPITAL_COMMUNITY): Payer: Medicare Other

## 2015-09-09 ENCOUNTER — Encounter (HOSPITAL_COMMUNITY)
Admission: EM | Disposition: A | Discharge status: Skilled Nursing Facility | Payer: Self-pay | Source: Home / Self Care | Attending: Internal Medicine

## 2015-09-09 ENCOUNTER — Encounter (HOSPITAL_COMMUNITY): Payer: Self-pay | Admitting: Internal Medicine

## 2015-09-09 ENCOUNTER — Inpatient Hospital Stay (HOSPITAL_COMMUNITY): Payer: Medicare Other | Admitting: Anesthesiology

## 2015-09-09 DIAGNOSIS — S72142A Displaced intertrochanteric fracture of left femur, initial encounter for closed fracture: Secondary | ICD-10-CM | POA: Diagnosis not present

## 2015-09-09 DIAGNOSIS — I5032 Chronic diastolic (congestive) heart failure: Secondary | ICD-10-CM

## 2015-09-09 DIAGNOSIS — W1839XA Other fall on same level, initial encounter: Secondary | ICD-10-CM | POA: Diagnosis present

## 2015-09-09 DIAGNOSIS — Z809 Family history of malignant neoplasm, unspecified: Secondary | ICD-10-CM | POA: Diagnosis not present

## 2015-09-09 DIAGNOSIS — S72102D Unspecified trochanteric fracture of left femur, subsequent encounter for closed fracture with routine healing: Secondary | ICD-10-CM | POA: Diagnosis not present

## 2015-09-09 DIAGNOSIS — N390 Urinary tract infection, site not specified: Secondary | ICD-10-CM | POA: Diagnosis present

## 2015-09-09 DIAGNOSIS — W19XXXA Unspecified fall, initial encounter: Secondary | ICD-10-CM

## 2015-09-09 DIAGNOSIS — Z79899 Other long term (current) drug therapy: Secondary | ICD-10-CM | POA: Diagnosis not present

## 2015-09-09 DIAGNOSIS — H409 Unspecified glaucoma: Secondary | ICD-10-CM | POA: Diagnosis not present

## 2015-09-09 DIAGNOSIS — R0602 Shortness of breath: Secondary | ICD-10-CM | POA: Diagnosis not present

## 2015-09-09 DIAGNOSIS — J42 Unspecified chronic bronchitis: Secondary | ICD-10-CM | POA: Diagnosis not present

## 2015-09-09 DIAGNOSIS — S72002A Fracture of unspecified part of neck of left femur, initial encounter for closed fracture: Secondary | ICD-10-CM | POA: Diagnosis present

## 2015-09-09 DIAGNOSIS — S72102A Unspecified trochanteric fracture of left femur, initial encounter for closed fracture: Secondary | ICD-10-CM | POA: Diagnosis not present

## 2015-09-09 DIAGNOSIS — Z79891 Long term (current) use of opiate analgesic: Secondary | ICD-10-CM | POA: Diagnosis not present

## 2015-09-09 DIAGNOSIS — I87309 Chronic venous hypertension (idiopathic) without complications of unspecified lower extremity: Secondary | ICD-10-CM | POA: Diagnosis not present

## 2015-09-09 DIAGNOSIS — H3530 Unspecified macular degeneration: Secondary | ICD-10-CM | POA: Diagnosis not present

## 2015-09-09 DIAGNOSIS — S72002G Fracture of unspecified part of neck of left femur, subsequent encounter for closed fracture with delayed healing: Secondary | ICD-10-CM | POA: Diagnosis not present

## 2015-09-09 DIAGNOSIS — I4891 Unspecified atrial fibrillation: Secondary | ICD-10-CM | POA: Diagnosis present

## 2015-09-09 DIAGNOSIS — S72109A Unspecified trochanteric fracture of unspecified femur, initial encounter for closed fracture: Secondary | ICD-10-CM | POA: Insufficient documentation

## 2015-09-09 DIAGNOSIS — M25552 Pain in left hip: Secondary | ICD-10-CM | POA: Diagnosis not present

## 2015-09-09 DIAGNOSIS — M1990 Unspecified osteoarthritis, unspecified site: Secondary | ICD-10-CM | POA: Diagnosis not present

## 2015-09-09 DIAGNOSIS — J449 Chronic obstructive pulmonary disease, unspecified: Secondary | ICD-10-CM | POA: Diagnosis present

## 2015-09-09 DIAGNOSIS — M179 Osteoarthritis of knee, unspecified: Secondary | ICD-10-CM | POA: Diagnosis not present

## 2015-09-09 DIAGNOSIS — I110 Hypertensive heart disease with heart failure: Secondary | ICD-10-CM | POA: Diagnosis present

## 2015-09-09 DIAGNOSIS — I481 Persistent atrial fibrillation: Secondary | ICD-10-CM

## 2015-09-09 DIAGNOSIS — M722 Plantar fascial fibromatosis: Secondary | ICD-10-CM | POA: Diagnosis not present

## 2015-09-09 DIAGNOSIS — I509 Heart failure, unspecified: Secondary | ICD-10-CM | POA: Diagnosis not present

## 2015-09-09 DIAGNOSIS — J309 Allergic rhinitis, unspecified: Secondary | ICD-10-CM | POA: Diagnosis not present

## 2015-09-09 DIAGNOSIS — Z9181 History of falling: Secondary | ICD-10-CM | POA: Diagnosis not present

## 2015-09-09 DIAGNOSIS — I480 Paroxysmal atrial fibrillation: Secondary | ICD-10-CM | POA: Diagnosis not present

## 2015-09-09 DIAGNOSIS — Z888 Allergy status to other drugs, medicaments and biological substances status: Secondary | ICD-10-CM | POA: Diagnosis not present

## 2015-09-09 DIAGNOSIS — Z7982 Long term (current) use of aspirin: Secondary | ICD-10-CM | POA: Diagnosis not present

## 2015-09-09 DIAGNOSIS — I872 Venous insufficiency (chronic) (peripheral): Secondary | ICD-10-CM | POA: Diagnosis not present

## 2015-09-09 DIAGNOSIS — D62 Acute posthemorrhagic anemia: Secondary | ICD-10-CM | POA: Diagnosis not present

## 2015-09-09 DIAGNOSIS — S72042A Displaced fracture of base of neck of left femur, initial encounter for closed fracture: Secondary | ICD-10-CM | POA: Diagnosis not present

## 2015-09-09 DIAGNOSIS — M21252 Flexion deformity, left hip: Secondary | ICD-10-CM | POA: Diagnosis not present

## 2015-09-09 DIAGNOSIS — I959 Hypotension, unspecified: Secondary | ICD-10-CM | POA: Diagnosis not present

## 2015-09-09 DIAGNOSIS — S7292XA Unspecified fracture of left femur, initial encounter for closed fracture: Secondary | ICD-10-CM | POA: Diagnosis not present

## 2015-09-09 DIAGNOSIS — Z66 Do not resuscitate: Secondary | ICD-10-CM | POA: Diagnosis present

## 2015-09-09 DIAGNOSIS — I10 Essential (primary) hypertension: Secondary | ICD-10-CM | POA: Diagnosis not present

## 2015-09-09 DIAGNOSIS — S72001A Fracture of unspecified part of neck of right femur, initial encounter for closed fracture: Secondary | ICD-10-CM | POA: Diagnosis not present

## 2015-09-09 DIAGNOSIS — I340 Nonrheumatic mitral (valve) insufficiency: Secondary | ICD-10-CM | POA: Diagnosis not present

## 2015-09-09 DIAGNOSIS — S72142D Displaced intertrochanteric fracture of left femur, subsequent encounter for closed fracture with routine healing: Secondary | ICD-10-CM | POA: Diagnosis not present

## 2015-09-09 DIAGNOSIS — Z880 Allergy status to penicillin: Secondary | ICD-10-CM | POA: Diagnosis not present

## 2015-09-09 HISTORY — PX: FEMUR IM NAIL: SHX1597

## 2015-09-09 HISTORY — DX: Fracture of unspecified part of neck of left femur, initial encounter for closed fracture: S72.002A

## 2015-09-09 HISTORY — DX: Chronic diastolic (congestive) heart failure: I50.32

## 2015-09-09 LAB — BASIC METABOLIC PANEL
ANION GAP: 10 (ref 5–15)
BUN: 21 mg/dL — ABNORMAL HIGH (ref 6–20)
CALCIUM: 9.3 mg/dL (ref 8.9–10.3)
CO2: 32 mmol/L (ref 22–32)
Chloride: 100 mmol/L — ABNORMAL LOW (ref 101–111)
Creatinine, Ser: 0.81 mg/dL (ref 0.44–1.00)
GFR calc non Af Amer: >60 mL/min (ref >60)
GLUCOSE: 136 mg/dL — AB (ref 65–99)
POTASSIUM: 3.9 mmol/L (ref 3.5–5.1)
Sodium: 142 mmol/L (ref 135–145)

## 2015-09-09 LAB — ABO/RH: ABO/RH(D): AB POS

## 2015-09-09 LAB — URINALYSIS, ROUTINE W REFLEX MICROSCOPIC
Bilirubin Urine: NEGATIVE
Glucose, UA: NEGATIVE mg/dL
Ketones, ur: NEGATIVE mg/dL
NITRITE: POSITIVE — AB
PROTEIN: NEGATIVE mg/dL
Specific Gravity, Urine: 1.012 (ref 1.005–1.030)
UROBILINOGEN UA: 0.2 mg/dL (ref 0.0–1.0)
pH: 6 (ref 5.0–8.0)

## 2015-09-09 LAB — CBC
HCT: 35.4 % — ABNORMAL LOW (ref 36.0–46.0)
Hemoglobin: 11.5 g/dL — ABNORMAL LOW (ref 12.0–15.0)
MCH: 32.6 pg (ref 26.0–34.0)
MCHC: 32.5 g/dL (ref 30.0–36.0)
MCV: 100.3 fL — ABNORMAL HIGH (ref 78.0–100.0)
PLATELETS: 123 10*3/uL — AB (ref 150–400)
RBC: 3.53 MIL/uL — AB (ref 3.87–5.11)
RDW: 18.1 % — ABNORMAL HIGH (ref 11.5–15.5)
WBC: 8 10*3/uL (ref 4.0–10.5)

## 2015-09-09 LAB — COMPREHENSIVE METABOLIC PANEL
ALBUMIN: 4.1 g/dL (ref 3.5–5.0)
ALK PHOS: 56 U/L (ref 38–126)
ALT: 11 U/L — AB (ref 14–54)
AST: 26 U/L (ref 15–41)
Anion gap: 10 (ref 5–15)
BUN: 23 mg/dL — AB (ref 6–20)
CHLORIDE: 100 mmol/L — AB (ref 101–111)
CO2: 29 mmol/L (ref 22–32)
Calcium: 9.5 mg/dL (ref 8.9–10.3)
Creatinine, Ser: 0.88 mg/dL (ref 0.44–1.00)
GFR calc non Af Amer: 55 mL/min — ABNORMAL LOW (ref >60)
GLUCOSE: 117 mg/dL — AB (ref 65–99)
Potassium: 3.6 mmol/L (ref 3.5–5.1)
SODIUM: 139 mmol/L (ref 135–145)
Total Bilirubin: 1.3 mg/dL — ABNORMAL HIGH (ref 0.3–1.2)
Total Protein: 6.9 g/dL (ref 6.5–8.1)

## 2015-09-09 LAB — URINE MICROSCOPIC-ADD ON

## 2015-09-09 LAB — BRAIN NATRIURETIC PEPTIDE: B Natriuretic Peptide: 370.4 pg/mL — ABNORMAL HIGH (ref 0.0–100.0)

## 2015-09-09 LAB — GLUCOSE, CAPILLARY: GLUCOSE-CAPILLARY: 118 mg/dL — AB (ref 65–99)

## 2015-09-09 SURGERY — INSERTION, INTRAMEDULLARY ROD, FEMUR
Anesthesia: Spinal | Site: Hip | Laterality: Left

## 2015-09-09 MED ORDER — FENTANYL CITRATE (PF) 100 MCG/2ML IJ SOLN
INTRAMUSCULAR | Status: DC | PRN
  Administered 2015-09-09: 25 ug via INTRAVENOUS

## 2015-09-09 MED ORDER — PROPOFOL 500 MG/50ML IV EMUL
INTRAVENOUS | Status: DC | PRN
  Administered 2015-09-09: 25 ug/kg/min via INTRAVENOUS

## 2015-09-09 MED ORDER — BRIMONIDINE TARTRATE 0.15 % OP SOLN
1.0000 [drp] | Freq: Two times a day (BID) | OPHTHALMIC | Status: DC
  Administered 2015-09-09 – 2015-09-13 (×8): 1 [drp] via OPHTHALMIC
  Filled 2015-09-09: qty 5

## 2015-09-09 MED ORDER — HYDROCODONE-ACETAMINOPHEN 5-325 MG PO TABS
1.0000 | ORAL_TABLET | Freq: Four times a day (QID) | ORAL | Status: DC | PRN
  Administered 2015-09-12 – 2015-09-13 (×2): 1 via ORAL
  Filled 2015-09-09 (×2): qty 1

## 2015-09-09 MED ORDER — ASPIRIN EC 325 MG PO TBEC
325.0000 mg | DELAYED_RELEASE_TABLET | Freq: Two times a day (BID) | ORAL | Status: DC
  Administered 2015-09-10 – 2015-09-13 (×7): 325 mg via ORAL
  Filled 2015-09-09 (×7): qty 1

## 2015-09-09 MED ORDER — POLYVINYL ALCOHOL 1.4 % OP SOLN
1.0000 [drp] | OPHTHALMIC | Status: DC | PRN
  Filled 2015-09-09: qty 15

## 2015-09-09 MED ORDER — ACETAMINOPHEN 650 MG RE SUPP: 650.0000 mg | Freq: Four times a day (QID) | RECTAL | Status: DC | PRN

## 2015-09-09 MED ORDER — PHENYLEPHRINE 40 MCG/ML (10ML) SYRINGE FOR IV PUSH (FOR BLOOD PRESSURE SUPPORT)
PREFILLED_SYRINGE | INTRAVENOUS | Status: AC
  Filled 2015-09-09: qty 10

## 2015-09-09 MED ORDER — CLINDAMYCIN PHOSPHATE 600 MG/50ML IV SOLN
600.0000 mg | Freq: Four times a day (QID) | INTRAVENOUS | Status: AC
  Administered 2015-09-09 – 2015-09-10 (×2): 600 mg via INTRAVENOUS
  Filled 2015-09-09 (×2): qty 50

## 2015-09-09 MED ORDER — FUROSEMIDE 40 MG PO TABS: 40.0000 mg | ORAL_TABLET | Freq: Every day | ORAL | Status: DC

## 2015-09-09 MED ORDER — SODIUM CHLORIDE 0.9 % IJ SOLN
3.0000 mL | Freq: Two times a day (BID) | INTRAMUSCULAR | Status: DC
  Administered 2015-09-09: 3 mL via INTRAVENOUS

## 2015-09-09 MED ORDER — PHENOL 1.4 % MT LIQD
1.0000 | OROMUCOSAL | Status: DC | PRN
  Filled 2015-09-09: qty 177

## 2015-09-09 MED ORDER — PROPOFOL 10 MG/ML IV BOLUS
INTRAVENOUS | Status: DC | PRN
  Administered 2015-09-09: 20 mg via INTRAVENOUS

## 2015-09-09 MED ORDER — MIRTAZAPINE 15 MG PO TABS
15.0000 mg | ORAL_TABLET | Freq: Every evening | ORAL | Status: DC | PRN
  Administered 2015-09-09: 15 mg via ORAL
  Filled 2015-09-09: qty 1

## 2015-09-09 MED ORDER — CLINDAMYCIN PHOSPHATE 900 MG/50ML IV SOLN
INTRAVENOUS | Status: AC
  Filled 2015-09-09: qty 50

## 2015-09-09 MED ORDER — OXYCODONE-ACETAMINOPHEN 5-325 MG PO TABS: 1.0000 | ORAL_TABLET | ORAL | Status: DC | PRN

## 2015-09-09 MED ORDER — MEPERIDINE HCL 50 MG/ML IJ SOLN: 6.2500 mg | INTRAMUSCULAR | Status: DC | PRN

## 2015-09-09 MED ORDER — ISOPROPYL ALCOHOL 70 % SOLN
Status: AC
  Filled 2015-09-09: qty 480

## 2015-09-09 MED ORDER — CARBOXYMETHYLCELLULOSE SODIUM 0.5 % OP SOLN: 2.0000 [drp] | Freq: Every day | OPHTHALMIC | Status: DC | PRN

## 2015-09-09 MED ORDER — LEVOFLOXACIN IN D5W 250 MG/50ML IV SOLN
250.0000 mg | Freq: Every day | INTRAVENOUS | Status: DC
  Administered 2015-09-09 – 2015-09-10 (×2): 250 mg via INTRAVENOUS
  Filled 2015-09-09 (×3): qty 50

## 2015-09-09 MED ORDER — PROPOFOL 10 MG/ML IV BOLUS
INTRAVENOUS | Status: AC
  Filled 2015-09-09: qty 20

## 2015-09-09 MED ORDER — FENTANYL CITRATE (PF) 100 MCG/2ML IJ SOLN: 25.0000 ug | INTRAMUSCULAR | Status: DC | PRN

## 2015-09-09 MED ORDER — ALCOHOL (RUBBING) 70 % SOLN
Status: DC | PRN
  Administered 2015-09-09: 50 mL via TOPICAL

## 2015-09-09 MED ORDER — LATANOPROST 0.005 % OP SOLN
1.0000 [drp] | Freq: Every day | OPHTHALMIC | Status: DC
  Administered 2015-09-09 – 2015-09-12 (×4): 1 [drp] via OPHTHALMIC
  Filled 2015-09-09: qty 2.5

## 2015-09-09 MED ORDER — METOCLOPRAMIDE HCL 5 MG/ML IJ SOLN: 5.0000 mg | Freq: Three times a day (TID) | INTRAMUSCULAR | Status: DC | PRN

## 2015-09-09 MED ORDER — PROMETHAZINE HCL 25 MG/ML IJ SOLN: 6.2500 mg | INTRAMUSCULAR | Status: DC | PRN

## 2015-09-09 MED ORDER — ASPIRIN EC 81 MG PO TBEC
81.0000 mg | DELAYED_RELEASE_TABLET | Freq: Every day | ORAL | Status: DC
  Filled 2015-09-09: qty 1

## 2015-09-09 MED ORDER — DEXTROSE 5 % IV SOLN
10.0000 mg | INTRAVENOUS | Status: DC | PRN
  Administered 2015-09-09: 60 ug/min via INTRAVENOUS

## 2015-09-09 MED ORDER — HYDRALAZINE HCL 20 MG/ML IJ SOLN: 5.0000 mg | INTRAMUSCULAR | Status: DC | PRN

## 2015-09-09 MED ORDER — KETAMINE HCL 10 MG/ML IJ SOLN
INTRAMUSCULAR | Status: AC
  Filled 2015-09-09: qty 1

## 2015-09-09 MED ORDER — ONDANSETRON HCL 4 MG/2ML IJ SOLN: 4.0000 mg | Freq: Four times a day (QID) | INTRAMUSCULAR | Status: DC | PRN

## 2015-09-09 MED ORDER — SPIRONOLACTONE 12.5 MG HALF TABLET: 12.5000 mg | ORAL_TABLET | Freq: Every day | ORAL | Status: DC

## 2015-09-09 MED ORDER — MELATONIN 1 MG PO TABS
1.0000 | ORAL_TABLET | Freq: Every day | ORAL | Status: DC
Start: 2015-09-09 — End: 2015-09-09

## 2015-09-09 MED ORDER — ACETAMINOPHEN 325 MG PO TABS
650.0000 mg | ORAL_TABLET | Freq: Four times a day (QID) | ORAL | Status: DC | PRN
  Administered 2015-09-10 – 2015-09-12 (×5): 650 mg via ORAL
  Filled 2015-09-09 (×5): qty 2

## 2015-09-09 MED ORDER — FENTANYL CITRATE (PF) 100 MCG/2ML IJ SOLN
INTRAMUSCULAR | Status: AC
  Filled 2015-09-09: qty 4

## 2015-09-09 MED ORDER — SODIUM CHLORIDE 0.9 % IV SOLN: INTRAVENOUS | Status: DC

## 2015-09-09 MED ORDER — LACTATED RINGERS IV SOLN
INTRAVENOUS | Status: DC | PRN
  Administered 2015-09-09: 11:00:00 via INTRAVENOUS

## 2015-09-09 MED ORDER — ALBUTEROL SULFATE (2.5 MG/3ML) 0.083% IN NEBU: 2.5000 mg | INHALATION_SOLUTION | Freq: Four times a day (QID) | RESPIRATORY_TRACT | Status: DC | PRN

## 2015-09-09 MED ORDER — ONDANSETRON HCL 4 MG/2ML IJ SOLN
INTRAMUSCULAR | Status: AC
  Filled 2015-09-09: qty 2

## 2015-09-09 MED ORDER — PHENYLEPHRINE HCL 10 MG/ML IJ SOLN
INTRAMUSCULAR | Status: DC | PRN
  Administered 2015-09-09: 80 ug via INTRAVENOUS
  Administered 2015-09-09 (×2): 40 ug via INTRAVENOUS
  Administered 2015-09-09: 80 ug via INTRAVENOUS

## 2015-09-09 MED ORDER — KETAMINE HCL 50 MG/ML IJ SOLN
INTRAMUSCULAR | Status: DC | PRN
  Administered 2015-09-09: 30 mg via INTRAMUSCULAR

## 2015-09-09 MED ORDER — ONDANSETRON HCL 4 MG PO TABS: 4.0000 mg | ORAL_TABLET | Freq: Four times a day (QID) | ORAL | Status: DC | PRN

## 2015-09-09 MED ORDER — PHENYLEPHRINE HCL 10 MG/ML IJ SOLN
INTRAMUSCULAR | Status: AC
  Filled 2015-09-09: qty 1

## 2015-09-09 MED ORDER — MENTHOL 3 MG MT LOZG
1.0000 | LOZENGE | OROMUCOSAL | Status: DC | PRN
  Filled 2015-09-09: qty 9

## 2015-09-09 MED ORDER — BUPIVACAINE HCL (PF) 0.5 % IJ SOLN
INTRAMUSCULAR | Status: DC | PRN
  Administered 2015-09-09: 2.5 mL

## 2015-09-09 MED ORDER — MORPHINE SULFATE (PF) 2 MG/ML IV SOLN
1.0000 mg | INTRAVENOUS | Status: DC | PRN
  Administered 2015-09-09: 1 mg via INTRAVENOUS
  Filled 2015-09-09: qty 1

## 2015-09-09 MED ORDER — ACETAMINOPHEN 325 MG PO TABS: 650.0000 mg | ORAL_TABLET | Freq: Four times a day (QID) | ORAL | Status: DC | PRN

## 2015-09-09 MED ORDER — MORPHINE SULFATE (PF) 2 MG/ML IV SOLN
0.5000 mg | INTRAVENOUS | Status: DC | PRN
  Administered 2015-09-12 (×2): 0.5 mg via INTRAVENOUS
  Filled 2015-09-09 (×2): qty 1

## 2015-09-09 MED ORDER — DEXTROSE 5 % IV SOLN: 30.0000 ug/min | INTRAVENOUS | Status: DC

## 2015-09-09 MED ORDER — LACTATED RINGERS IV SOLN: INTRAVENOUS | Status: DC

## 2015-09-09 MED ORDER — METOCLOPRAMIDE HCL 10 MG PO TABS: 5.0000 mg | ORAL_TABLET | Freq: Three times a day (TID) | ORAL | Status: DC | PRN

## 2015-09-09 MED ORDER — METOPROLOL SUCCINATE ER 50 MG PO TB24
50.0000 mg | ORAL_TABLET | Freq: Every day | ORAL | Status: DC
  Administered 2015-09-10 – 2015-09-13 (×4): 50 mg via ORAL
  Filled 2015-09-09 (×6): qty 1

## 2015-09-09 MED ORDER — CLINDAMYCIN PHOSPHATE 900 MG/50ML IV SOLN
900.0000 mg | INTRAVENOUS | Status: AC
  Administered 2015-09-09: 900 mg via INTRAVENOUS

## 2015-09-09 MED ORDER — TRAMADOL HCL 50 MG PO TABS
50.0000 mg | ORAL_TABLET | Freq: Four times a day (QID) | ORAL | Status: DC | PRN
  Administered 2015-09-09 – 2015-09-12 (×5): 50 mg via ORAL
  Filled 2015-09-09 (×5): qty 1

## 2015-09-09 MED ORDER — SODIUM CHLORIDE 0.9 % IR SOLN
Status: DC | PRN
  Administered 2015-09-09: 1000 mL

## 2015-09-09 SURGICAL SUPPLY — 39 items
BAG ZIPLOCK 12X15 (MISCELLANEOUS) IMPLANT
BIT DRILL SHORT 4.2 (BIT) ×1 IMPLANT
BLADE HELICAL TFNA 90 HIP (Anchor) ×2 IMPLANT
BLADE HELICAL TFNA 90MM HIP (Anchor) ×1 IMPLANT
CHLORAPREP W/TINT 26ML (MISCELLANEOUS) ×3 IMPLANT
COVER PERINEAL POST (MISCELLANEOUS) ×3 IMPLANT
DRAPE C-ARM 42X120 X-RAY (DRAPES) ×3 IMPLANT
DRAPE C-ARMOR (DRAPES) ×3 IMPLANT
DRAPE ORTHO SPLIT 77X108 STRL (DRAPES)
DRAPE STERI IOBAN 125X83 (DRAPES) ×3 IMPLANT
DRAPE SURG ORHT 6 SPLT 77X108 (DRAPES) IMPLANT
DRAPE U-SHAPE 47X51 STRL (DRAPES) ×6 IMPLANT
DRILL BIT SHORT 4.2 (BIT) ×2
DRSG MEPILEX BORDER 4X4 (GAUZE/BANDAGES/DRESSINGS) ×3 IMPLANT
ELECT BLADE TIP CTD 4 INCH (ELECTRODE) ×3 IMPLANT
GAUZE SPONGE 4X4 12PLY STRL (GAUZE/BANDAGES/DRESSINGS) ×3 IMPLANT
GLOVE BIOGEL PI IND STRL 8.5 (GLOVE) ×1 IMPLANT
GLOVE BIOGEL PI INDICATOR 8.5 (GLOVE) ×2
GLOVE SURG ORTHO 8.5 STRL (GLOVE) ×9 IMPLANT
GOWN SPEC L3 XXLG W/TWL (GOWN DISPOSABLE) ×6 IMPLANT
GUIDEWIRE 3.2X400 (WIRE) ×3 IMPLANT
GUIDEWIRE BALL NOSE 100CM (WIRE) IMPLANT
KIT BASIN OR (CUSTOM PROCEDURE TRAY) ×3 IMPLANT
LIQUID BAND (GAUZE/BANDAGES/DRESSINGS) ×3 IMPLANT
MANIFOLD NEPTUNE II (INSTRUMENTS) ×3 IMPLANT
NAIL CANN TFNA 11X130X380 LEFT (Nail) ×3 IMPLANT
PACK TOTAL JOINT (CUSTOM PROCEDURE TRAY) ×3 IMPLANT
PEN SKIN MARKING BROAD (MISCELLANEOUS) ×3 IMPLANT
REAMER ROD DEEP FLUTE 2.5X950 (INSTRUMENTS) ×3 IMPLANT
SCREW LOCK T25 FT 36X5X4.3X (Screw) ×1 IMPLANT
SCREW LOCKING 5.0X36MM (Screw) ×2 IMPLANT
SUT MNCRL AB 3-0 PS2 18 (SUTURE) ×6 IMPLANT
SUT MON AB 2-0 CT1 27 (SUTURE) ×6 IMPLANT
SUT VIC AB 1 CT1 27 (SUTURE) ×4
SUT VIC AB 1 CT1 27XBRD ANTBC (SUTURE) ×2 IMPLANT
SUT VIC AB 2-0 CT1 27 (SUTURE) ×4
SUT VIC AB 2-0 CT1 27XBRD (SUTURE) ×2 IMPLANT
TRAY FOLEY W/METER SILVER 14FR (SET/KITS/TRAYS/PACK) ×3 IMPLANT
YANKAUER SUCT BULB TIP NO VENT (SUCTIONS) ×3 IMPLANT

## 2015-09-09 NOTE — Anesthesia Preprocedure Evaluation (Addendum)
Anesthesia Evaluation  Patient identified by MRN, date of birth, ID band Patient awake    Reviewed: Allergy & Precautions, NPO status , Patient's Chart, lab work & pertinent test results, reviewed documented beta blocker date and time   Airway Mallampati: II       Dental  (+) Teeth Intact   Pulmonary COPD,    breath sounds clear to auscultation       Cardiovascular hypertension, Pt. on medications +CHF (CXR without congestion, does have cardiomegaly)  + dysrhythmias Atrial Fibrillation  Rhythm:Irregular  HX of atrial Fib, not on anticoags secondary to high fall risk, EKG AF   Neuro/Psych    GI/Hepatic negative GI ROS, Neg liver ROS,   Endo/Other  negative endocrine ROS  Renal/GU negative Renal ROS     Musculoskeletal   Abdominal (+)  Abdomen: soft.    Peds  Hematology 11.5/35  Plts 123   Anesthesia Other Findings   Reproductive/Obstetrics                          Anesthesia Physical Anesthesia Plan  ASA: III  Anesthesia Plan: Spinal   Post-op Pain Management:    Induction:   Airway Management Planned: Nasal Cannula  Additional Equipment:   Intra-op Plan:   Post-operative Plan:   Informed Consent: I have reviewed the patients History and Physical, chart, labs and discussed the procedure including the risks, benefits and alternatives for the proposed anesthesia with the patient or authorized representative who has indicated his/her understanding and acceptance.     Plan Discussed with:   Anesthesia Plan Comments:         Anesthesia Quick Evaluation

## 2015-09-09 NOTE — Anesthesia Postprocedure Evaluation (Signed)
  Anesthesia Post-op Note  Patient: Mariah [name]  Procedure(s) Performed: Procedure(s): INTRAMEDULLARY (IM) NAIL FEMORAL (Left)  Patient Location: PACU  Anesthesia Type:Spinal  Level of Consciousness: awake  Airway and Oxygen Therapy: Patient Spontanous Breathing  Post-op Pain: mild  Post-op Assessment: Post-op Vital signs reviewed, Patient's Cardiovascular Status Stable, Respiratory Function Stable, RESPIRATORY FUNCTION UNSTABLE and No signs of Nausea or vomiting LLE Motor Response: Purposeful movement LLE Sensation: Full sensation RLE Motor Response: Purposeful movement RLE Sensation: Full sensation L Sensory Level: S1-Sole of foot, small toes R Sensory Level: S1-Sole of foot, small toes  Post-op Vital Signs: Reviewed and stable  Last Vitals:  Filed Vitals:   09/09/15 1345  BP: 114/64  Pulse: 83  Temp:   Resp: 12    Complications: No apparent anesthesia complications

## 2015-09-09 NOTE — Discharge Instructions (Signed)
 Dr. Travius Crochet Adult Hip & Knee Specialist [facility] [address] [place], [place] [address] [phone]   POSTOPERATIVE DIRECTIONS    Hip Rehabilitation, Guidelines Following Surgery   WEIGHT BEARING Weight bearing as tolerated with assist device (walker, cane, etc) as directed, use it as long as suggested by your surgeon or therapist, typically at least 4-6 weeks.   HOME CARE INSTRUCTIONS  Remove items at home which could result in a fall. This includes throw rugs or furniture in walking pathways.  Continue medications as instructed at time of discharge.  You may have some home medications which will be placed on hold until you complete the course of blood thinner medication.  4 days after discharge, you may start showering. No tub baths or soaking your incisions. Do not put on socks or shoes without following the instructions of your caregivers.   Sit on chairs with arms. Use the chair arms to help push yourself up when arising.  Arrange for the use of a toilet seat elevator so you are not sitting low.   Walk with walker as instructed.  You may resume a sexual relationship in one month or when given the OK by your caregiver.  Use walker as long as suggested by your caregivers.  Avoid periods of inactivity such as sitting longer than an hour when not asleep. This helps prevent blood clots.  You may return to work once you are cleared by your surgeon.  Do not drive a car for 6 weeks or until released by your surgeon.  Do not drive while taking narcotics.  Wear elastic stockings for two weeks following surgery during the day but you may remove then at night.  Make sure you keep all of your appointments after your operation with all of your doctors and caregivers. You should call the office at the above phone number and make an appointment for approximately two weeks after the date of your surgery. Please pick up a stool softener and laxative  for home use as long as you are requiring pain medications.  ICE to the affected hip every three hours for 30 minutes at a time and then as needed for pain and swelling. Continue to use ice on the hip for pain and swelling from surgery. You may notice swelling that will progress down to the foot and ankle.  This is normal after surgery.  Elevate the leg when you are not up walking on it.   It is important for you to complete the blood thinner medication as prescribed by your doctor.  Continue to use the breathing machine which will help keep your temperature down.  It is common for your temperature to cycle up and down following surgery, especially at night when you are not up moving around and exerting yourself.  The breathing machine keeps your lungs expanded and your temperature down.  RANGE OF MOTION AND STRENGTHENING EXERCISES  These exercises are designed to help you keep full movement of your hip joint. Follow your caregiver's or physical therapist's instructions. Perform all exercises about fifteen times, three times per day or as directed. Exercise both hips, even if you have had only one joint replacement. These exercises can be done on a training (exercise) mat, on the floor, on a table or on a bed. Use whatever works the best and is most comfortable for you. Use music or television while you are exercising so that the exercises are a pleasant break in your day. This   will make your life better with the exercises acting as a break in routine you can look forward to.  Lying on your back, slowly slide your foot toward your buttocks, raising your knee up off the floor. Then slowly slide your foot back down until your leg is straight again.  Lying on your back spread your legs as far apart as you can without causing discomfort.  Lying on your side, raise your upper leg and foot straight up from the floor as far as is comfortable. Slowly lower the leg and repeat.  Lying on your back, tighten up the  muscle in the front of your thigh (quadriceps muscles). You can do this by keeping your leg straight and trying to raise your heel off the floor. This helps strengthen the largest muscle supporting your knee.  Lying on your back, tighten up the muscles of your buttocks both with the legs straight and with the knee bent at a comfortable angle while keeping your heel on the floor.   SKILLED REHAB INSTRUCTIONS: If the patient is transferred to a skilled rehab facility following release from the hospital, a list of the current medications will be sent to the facility for the patient to continue.  When discharged from the skilled rehab facility, please have the facility set up the patient's Home Health Physical Therapy prior to being released. Also, the skilled facility will be responsible for providing the patient with their medications at time of release from the facility to include their pain medication and their blood thinner medication. If the patient is still at the rehab facility at time of the two week follow up appointment, the skilled rehab facility will also need to assist the patient in arranging follow up appointment in our office and any transportation needs.  MAKE SURE YOU:  Understand these instructions.  Will watch your condition.  Will get help right away if you are not doing well or get worse.  Pick up stool softner and laxative for home use following surgery while on pain medications. Daily dry dressing changes as needed. In 4 days, you may remove your dressings and begin taking showers - no tub baths or soaking the incisions. Continue to use ice for pain and swelling after surgery. Do not use any lotions or creams on the incision until instructed by your surgeon.   

## 2015-09-09 NOTE — Interval H&P Note (Signed)
History and Physical Interval Note:  09/09/2015 10:50 AM  Mariah [name]  has presented today for surgery, with the diagnosis of left femur fracture  The various methods of treatment have been discussed with the patient and family. After consideration of risks, benefits and other options for treatment, the patient has consented to  Procedure(s): INTRAMEDULLARY (IM) NAIL FEMORAL (Left) as a surgical intervention .  The patient's history has been reviewed, patient examined, no change in status, stable for surgery.  I have reviewed the patient's chart and labs.  Questions were answered to the patient's satisfaction.     Wen Merced, Horald Pollen

## 2015-09-09 NOTE — Anesthesia Procedure Notes (Signed)
Spinal Patient location during procedure: OR Reason for block: at surgeon's request Staffing Resident/CRNA: Anne Fu Performed by: resident/CRNA  Preanesthetic Checklist Completed: patient identified, site marked, surgical consent, pre-op evaluation, timeout performed, IV checked, risks and benefits discussed, monitors and equipment checked and at surgeon's request Spinal Block Patient position: left lateral decubitus Approach: left paramedian Location: L3-4 Injection technique: single-shot Needle Needle type: Spinocan  Needle gauge: 22 G Needle length: 9 cm Assessment Sensory level: T6 Additional Notes Expiration date of kit checked and confirmed. Patient tolerated procedure well, without complications, with noted clear CSF return. Loss of motor and sensory on exam post injection.  Difficulty placement due to tight spaces and osteoporosis.

## 2015-09-09 NOTE — Brief Op Note (Signed)
09/08/2015 - 09/09/2015  1:42 PM  PATIENT:  Carmell Austria  81 y.o. female  PRE-OPERATIVE DIAGNOSIS:  left femur fracture  POST-OPERATIVE DIAGNOSIS:  left femur fracture  PROCEDURE:  Procedure(s): INTRAMEDULLARY (IM) NAIL FEMORAL (Left)  SURGEON:  Surgeon(s) and Role:    * Rod Can, MD - Primary  PHYSICIAN ASSISTANT:   ASSISTANTS: none   ANESTHESIA:   spinal  EBL:  Total I/O In: 1000 [I.V.:1000] Out: 300 [Urine:100; Blood:200]  BLOOD ADMINISTERED:none  DRAINS: none   LOCAL MEDICATIONS USED:  NONE  SPECIMEN:  No Specimen  DISPOSITION OF SPECIMEN:  N/A  COUNTS:  YES  TOURNIQUET:  * No tourniquets in log *  DICTATION: .Other Dictation: Dictation Number [id number]  PLAN OF CARE: Admit to inpatient   PATIENT DISPOSITION:  PACU - hemodynamically stable.   Delay start of Pharmacological VTE agent (>24hrs) due to surgical blood loss or risk of bleeding: no

## 2015-09-09 NOTE — Transfer of Care (Signed)
Immediate Anesthesia Transfer of Care Note  Patient: Mariah [name]  Procedure(s) Performed: Procedure(s): INTRAMEDULLARY (IM) NAIL FEMORAL (Left)  Patient Location: PACU  Anesthesia Type:Spinal  Level of Consciousness:  sedated, patient cooperative and responds to stimulation  Airway & Oxygen Therapy:Patient Spontanous Breathing and Patient connected to face mask oxgen  Post-op Assessment:  Report given to PACU RN and Post -op Vital signs reviewed and stable  Post vital signs:  Reviewed and stable  Last Vitals:  Filed Vitals:   09/09/15 1021  BP: 127/53  Pulse: 93  Temp: 36.8 C  Resp: 20    Complications: No apparent anesthesia complications

## 2015-09-09 NOTE — Progress Notes (Signed)
PHARMACIST - PHYSICIAN ORDER COMMUNICATION  CONCERNING: P&T Medication Policy on Herbal Medications  DESCRIPTION:  This patient's order for:  Melatonin  has been noted.  This product(s) is classified as an "herbal" or natural product. Due to a lack of definitive safety studies or FDA approval, nonstandard manufacturing practices, plus the potential risk of unknown drug-drug interactions while on inpatient medications, the Pharmacy and Therapeutics Committee does not permit the use of "herbal" or natural products of this type within [place].   ACTION TAKEN: The pharmacy department is unable to verify this order at this time and your patient has been informed of this safety policy. Please reevaluate patient's clinical condition at discharge and address if the herbal or natural product(s) should be resumed at that time.  Braxtyn Bojarski, PharmD  

## 2015-09-09 NOTE — Consult Note (Signed)
ORTHOPAEDIC CONSULTATION  REQUESTING PHYSICIAN: Domenic Polite, MD  PCP:  No primary care provider on file.  Chief Complaint: left hip pain  HPI: Mariah [name] is a 81 y.o. female who complains of  Left hip pain. Was carrying a box yesterday when she lost her balance and fell on her left side. C/o left hip pain and inability to WB. Admitted to hospitalist. I was asked to see the patient by Dr. Fredonia Highland. 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  Past Medical History  Diagnosis Date  . COPD (chronic obstructive pulmonary disease) ([place])   . Atrial fibrillation ([place])   . Hypertension   . CHF (congestive heart failure) [facility])    Past Surgical History  Procedure Laterality Date  . Hernia repair     Social History   Social History  . Marital Status: Widowed    Spouse Name: N/A  . Number of Children: N/A  . Years of Education: N/A   Social History Main Topics  . Smoking status: Never Smoker   . Smokeless tobacco: None  . Alcohol Use: No  . Drug Use: No  . Sexual Activity: Not Asked   Other Topics Concern  . None   Social History Narrative   Family History  Problem Relation Age of Onset  . Prostate cancer Brother   . Cancer Father     Patient does not remember which type of cancer   Allergies  Allergen Reactions  . Lyrica [Pregabalin] Other (See Comments)    Caused her to be lethargic and her legs felt as if she could not stand  . Penicillins Rash    Has patient had a PCN reaction causing immediate rash, facial/tongue/throat swelling, SOB or lightheadedness with hypotension: YesYes Has patient had a PCN reaction causing severe rash involving mucus membranes or skin necrosis: NoNo Has patient had a PCN reaction that required hospitalization NoNo Has patient had a PCN reaction occurring within the last 10 years: NoNo If all of the above answers are "NO", then may proceed with Cephalosporin   Prior to Admission medications   Medication Sig Start Date End  Date Taking? Authorizing Provider  acetaminophen (TYLENOL) 500 MG tablet Take 1,000 mg by mouth 3 (three) times daily.   Yes Historical Provider, MD  albuterol (PROVENTIL) (2.5 MG/3ML) 0.083% nebulizer solution Take 2.5 mg by nebulization every 6 (six) hours as needed for wheezing or shortness of breath.   Yes Historical Provider, MD  aspirin EC 81 MG tablet Take 81 mg by mouth daily.   Yes Historical Provider, MD  brimonidine (ALPHAGAN P) 0.1 % SOLN Place 1 drop into both eyes 2 (two) times daily.   Yes Historical Provider, MD  carboxymethylcellulose (REFRESH PLUS) 0.5 % SOLN Place 2 drops into both eyes daily as needed (dry eyes).   Yes Historical Provider, MD  furosemide (LASIX) 40 MG tablet Take 40 mg by mouth daily.   Yes Historical Provider, MD  latanoprost (XALATAN) 0.005 % ophthalmic solution Place 1 drop into both eyes at bedtime.   Yes Historical Provider, MD  Melatonin 1 MG TABS Take 1 tablet by mouth daily.   Yes Historical Provider, MD  metoprolol succinate (TOPROL-XL) 50 MG 24 hr tablet Take 50 mg by mouth daily. Take with or immediately following a meal.   Yes Historical Provider, MD  mirtazapine (REMERON) 15 MG tablet Take 15 mg by mouth at bedtime as needed (sleep).   Yes Historical Provider, MD  PRESCRIPTION MEDICATION Apply 1 application  topically 2 (two) times daily as needed (pain).   Yes Historical Provider, MD  spironolactone (ALDACTONE) 25 MG tablet Take 12.5 mg by mouth daily.   Yes Historical Provider, MD  traMADol (ULTRAM) 50 MG tablet Take 50 mg by mouth every 6 (six) hours as needed for moderate pain.   Yes Historical Provider, MD   Dg Chest 2 View  09/08/2015  CLINICAL DATA:  Hip fracture EXAM: CHEST - 2 VIEW COMPARISON:  None. FINDINGS: Cardiac shadow is enlarged. The thoracic aorta is tortuous with calcification. No aneurysmal dilatation is seen. Blunting of left costophrenic angle is noted which may represent a small effusion but may be chronic in nature. No acute  bony abnormality is seen. IMPRESSION: Blunting of left costophrenic angle. Cardiomegaly. Electronically Signed   By: Inez Catalina M.D.   On: 09/08/2015 23:52   Dg Knee 1-2 Views Left  09/09/2015  CLINICAL DATA:  81 year old female with left hip fracture after falling yesterday. Pain radiates toward the knee. EXAM: LEFT KNEE - 1-2 VIEW COMPARISON:  None. FINDINGS: No evidence of acute fracture, malalignment or joint effusion. There is narrowing of the medial compartmental joint space consistent with osteoarthritis. Atherosclerotic calcifications noted in the superficial femoral artery and runoff vessels. IMPRESSION: 1. No acute fracture, malalignment or joint effusion. 2. Medial compartmental osteoarthritis. 3. Atherosclerotic vascular calcifications. Electronically Signed   By: Jacqulynn Cadet M.D.   On: 09/09/2015 07:39   Ct Head Wo Contrast  09/08/2015  CLINICAL DATA:  Fall today with blunt head trauma EXAM: CT HEAD WITHOUT CONTRAST TECHNIQUE: Contiguous axial images were obtained from the base of the skull through the vertex without intravenous contrast. COMPARISON:  None. FINDINGS: Bony calvarium is intact. No gross soft tissue abnormality is noted. Mild atrophic changes are seen. No findings to suggest acute hemorrhage, acute infarction or space-occupying mass lesion are noted. Changes of chronic white matter ischemic change are noted as well. IMPRESSION: Mild chronic atrophic and ischemic changes without acute abnormality. Electronically Signed   By: Inez Catalina M.D.   On: 09/08/2015 23:37   Dg Hip Unilat With Pelvis 2-3 Views Left  09/08/2015  CLINICAL DATA:  Fall today with left hip pain, initial encounter EXAM: DG HIP (WITH OR WITHOUT PELVIS) 2-3V LEFT COMPARISON:  None. FINDINGS: There is a comminuted fracture through the intratrochanteric region with impaction and angulation at the fracture site. The pelvic ring is intact. Calcified uterine fibroid is noted. IMPRESSION: Comminuted fracture  of the proximal left femur. Electronically Signed   By: Inez Catalina M.D.   On: 09/08/2015 23:53    Positive ROS: All other systems have been reviewed and were otherwise negative with the exception of those mentioned in the HPI and as above.  Physical Exam: General: Alert, no acute distress Cardiovascular: No pedal edema Respiratory: No cyanosis, no use of accessory musculature GI: No organomegaly, abdomen is soft and non-tender Skin: No lesions in the area of chief complaint Neurologic: Sensation intact distally Psychiatric: Patient is competent for consent with normal mood and affect Lymphatic: No axillary or cervical lymphadenopathy  MUSCULOSKELETAL: LLE shortened and externally rotated. Pain with logroll. + LE edema. NVI.   Assessment: L reverse obliquity IT femur fracture  Plan: I recommend IM nail fixation of left femur for pain control and to allow mobilization. We discussed the risks, beneftis, and alternatives to surgery.   The risks, benefits, and alternatives were discussed with the patient. There are risks associated with the surgery including, but not limited to, problems  with anesthesia (death), infection, differences in leg length/angulation/rotation, fracture of bones, loosening or failure of implants, hematoma (blood accumulation) which may require surgical drainage, blood clots, pulmonary embolism, nerve injury (foot drop), and blood vessel injury. The patient understands these risks and elects to proceed.  Plan for OR today   Lyla Glassing Horald Pollen, MD Cell [phone]    09/09/2015 7:45 AM

## 2015-09-09 NOTE — H&P (Signed)
[facility] History and Physical  Geraldene Eisel IBB:[id number] DOB: 1919/10/21 DOA: 09/08/2015  Referring physician: ED physician PCP: No primary care provider on file.  Specialists:   Chief Complaint: Left hip pain after fall.  HPI: Mariah [name] is a 81 y.o. female with PMH of hypertension, COPD, atrial fibrillation (not on anticoagulants due to high-risk of fall), CHF (not sure which type of CHF, no 2-D echo on record), who presents with left hip pain after fall.  Patient reports that she injured her left hip after fall tonight. The pt was carrying a basket and lost balance falling backwards. Pt states that she struck her head on a doorway. She usually ambulates with a walker but was not using it tonight. She developed severe pain over left hip. No numbness or weakness in left leg. She denies dizziness, chest pain, shortness of breath, unilateral weakness or loss of consciousness when event happened. She does not have shortness of breath, cough abdominal pain, diarrhea, symptoms of UTI. She was admitted to the hospital 2 weeks ago and treated for dehydration. At that time her fluid pills were stopped and she has had some leg swelling since.   In ED, patient was found to have left hip communited fracture of the proximal left femur by X-ray. INR 1.14, PTT 35, temperature normal, no tachycardia, negative troponin, electrolytes okay. Chest x-ray showed blunting of left costophrenic angle.   Where does patient live?   At home  Can patient participate in ADLs?  Yes    Review of Systems:   General: no fevers, chills, no changes in body weight, has fatigue HEENT: no blurry vision, hearing changes or sore throat Pulm: no dyspnea, coughing, wheezing CV: no chest pain, palpitations Abd: no nausea, vomiting, abdominal pain, diarrhea, constipation GU: no dysuria, burning on urination, increased urinary frequency, hematuria  Ext: no leg edema Neuro: no unilateral weakness, numbness, or tingling, no  vision change or hearing loss Skin: no rash MSK: Right hip pain Heme: No easy bruising.  Travel history: No recent long distant travel.  Allergy:  Allergies  Allergen Reactions  . Lyrica [Pregabalin] Other (See Comments)    Caused her to be lethargic and her legs felt as if she could not stand  . Penicillins Rash    Has patient had a PCN reaction causing immediate rash, facial/tongue/throat swelling, SOB or lightheadedness with hypotension: NoYes Has patient had a PCN reaction causing severe rash involving mucus membranes or skin necrosis: NoNo Has patient had a PCN reaction that required hospitalization NoNo Has patient had a PCN reaction occurring within the last 10 years: NoNo If all of the above answers are "NO", then may proceed with Cephalosporin    Past Medical History  Diagnosis Date  . COPD (chronic obstructive pulmonary disease) ([place])   . Atrial fibrillation ([place])   . Hypertension   . CHF (congestive heart failure) [facility])     Past Surgical History  Procedure Laterality Date  . Hernia repair      Social History:  reports that she has never smoked. She does not have any smokeless tobacco history on file. She reports that she does not drink alcohol or use illicit drugs.  Family History:  Family History  Problem Relation Age of Onset  . Prostate cancer Brother   . Cancer Father     Patient does not remember which type of cancer     Prior to Admission medications   Medication Sig Start Date End Date Taking? Authorizing Provider  acetaminophen (  TYLENOL) 500 MG tablet Take 1,000 mg by mouth 3 (three) times daily.   Yes Historical Provider, MD  albuterol (PROVENTIL) (2.5 MG/3ML) 0.083% nebulizer solution Take 2.5 mg by nebulization every 6 (six) hours as needed for wheezing or shortness of breath.   Yes Historical Provider, MD  aspirin EC 81 MG tablet Take 81 mg by mouth daily.   Yes Historical Provider, MD  brimonidine (ALPHAGAN P) 0.1 % SOLN Place 1 drop into both  eyes 2 (two) times daily.   Yes Historical Provider, MD  carboxymethylcellulose (REFRESH PLUS) 0.5 % SOLN Place 2 drops into both eyes daily as needed (dry eyes).   Yes Historical Provider, MD  furosemide (LASIX) 40 MG tablet Take 40 mg by mouth daily.   Yes Historical Provider, MD  latanoprost (XALATAN) 0.005 % ophthalmic solution Place 1 drop into both eyes at bedtime.   Yes Historical Provider, MD  Melatonin 1 MG TABS Take 1 tablet by mouth daily.   Yes Historical Provider, MD  metoprolol succinate (TOPROL-XL) 50 MG 24 hr tablet Take 50 mg by mouth daily. Take with or immediately following a meal.   Yes Historical Provider, MD  mirtazapine (REMERON) 15 MG tablet Take 15 mg by mouth at bedtime as needed (sleep).   Yes Historical Provider, MD  PRESCRIPTION MEDICATION Apply 1 application topically 2 (two) times daily as needed (pain).   Yes Historical Provider, MD  spironolactone (ALDACTONE) 25 MG tablet Take 12.5 mg by mouth daily.   Yes Historical Provider, MD  traMADol (ULTRAM) 50 MG tablet Take 50 mg by mouth every 6 (six) hours as needed for moderate pain.   Yes Historical Provider, MD    Physical Exam: Filed Vitals:   09/08/15 2256  BP: 146/77  Pulse: 97  Temp: 97.9 F (36.6 C)  TempSrc: Oral  Resp: 20  SpO2: 96%   General: Not in acute distress HEENT:       Eyes: PERRL, EOMI, no scleral icterus.       ENT: No discharge from the ears and nose, no pharynx injection, no tonsillar enlargement.        Neck: No JVD, no bruit, no mass felt. Heme: No neck lymph node enlargement. Cardiac: S1/S2, irregularly irregular rhythm, No murmurs, No gallops or rubs. Pulm:  No rales, wheezing, rhonchi or rubs. Abd: Soft, nondistended, nontender, no rebound pain, no organomegaly, BS present. Ext: 1+ pitting leg edema bilaterally. 2+DP/PT pulse bilaterally. Musculoskeletal: Tenderness over left hip. Left leg slightly externally rotated  Skin: No rashes.  Neuro: Alert, oriented X3, cranial nerves  II-XII grossly intact, muscle strength 5/5 in all extremities, sensation to light touch intact. Brachial reflex 1+ bilaterally. Knee reflex 1+ bilaterally.  Psych: Patient is not psychotic, no suicidal or hemocidal ideation.  Labs on Admission:  Basic Metabolic Panel:  Recent Labs Lab 09/08/15 2325  NA 139  K 3.6  CL 100*  CO2 29  GLUCOSE 117*  BUN 23*  CREATININE 0.88  CALCIUM 9.5   Liver Function Tests:  Recent Labs Lab 09/08/15 2325  AST 26  ALT 11*  ALKPHOS 56  BILITOT 1.3*  PROT 6.9  ALBUMIN 4.1   No results for input(s): LIPASE, AMYLASE in the last 168 hours. No results for input(s): AMMONIA in the last 168 hours. CBC:  Recent Labs Lab 09/08/15 2325  WBC 8.6  NEUTROABS 6.5  HGB 12.8  HCT 38.8  MCV 99.7  PLT 128*   Cardiac Enzymes: No results for input(s): CKTOTAL, CKMB, CKMBINDEX, TROPONINI  in the last 168 hours.  BNP (last 3 results) No results for input(s): BNP in the last 8760 hours.  ProBNP (last 3 results) No results for input(s): PROBNP in the last 8760 hours.  CBG: No results for input(s): GLUCAP in the last 168 hours.  Radiological Exams on Admission: Dg Chest 2 View  09/08/2015  CLINICAL DATA:  Hip fracture EXAM: CHEST - 2 VIEW COMPARISON:  None. FINDINGS: Cardiac shadow is enlarged. The thoracic aorta is tortuous with calcification. No aneurysmal dilatation is seen. Blunting of left costophrenic angle is noted which may represent a small effusion but may be chronic in nature. No acute bony abnormality is seen. IMPRESSION: Blunting of left costophrenic angle. Cardiomegaly. Electronically Signed   By: Inez Catalina M.D.   On: 09/08/2015 23:52   Ct Head Wo Contrast  09/08/2015  CLINICAL DATA:  Fall today with blunt head trauma EXAM: CT HEAD WITHOUT CONTRAST TECHNIQUE: Contiguous axial images were obtained from the base of the skull through the vertex without intravenous contrast. COMPARISON:  None. FINDINGS: Bony calvarium is intact. No gross  soft tissue abnormality is noted. Mild atrophic changes are seen. No findings to suggest acute hemorrhage, acute infarction or space-occupying mass lesion are noted. Changes of chronic white matter ischemic change are noted as well. IMPRESSION: Mild chronic atrophic and ischemic changes without acute abnormality. Electronically Signed   By: Inez Catalina M.D.   On: 09/08/2015 23:37   Dg Hip Unilat With Pelvis 2-3 Views Left  09/08/2015  CLINICAL DATA:  Fall today with left hip pain, initial encounter EXAM: DG HIP (WITH OR WITHOUT PELVIS) 2-3V LEFT COMPARISON:  None. FINDINGS: There is a comminuted fracture through the intratrochanteric region with impaction and angulation at the fracture site. The pelvic ring is intact. Calcified uterine fibroid is noted. IMPRESSION: Comminuted fracture of the proximal left femur. Electronically Signed   By: Inez Catalina M.D.   On: 09/08/2015 23:53    EKG: Independently reviewed.  Abnormal findings: QTC 470, T-wave inversion in inferior leads and V4-V6, atrial fibrillation  Assessment/Plan Principal Problem:   Closed left hip fracture (HCC) Active Problems:   COPD (chronic obstructive pulmonary disease) (HCC)   Atrial fibrillation (HCC)   Hypertension   CHF (congestive heart failure) (HCC)  Closed left hip fracture ([place]):  As evidenced by x-ray. Patient has moderate pain now. No neurovascular compromise. Orthopedic surgeon was consulted. Dr. Percell Miller will see pt in AM.  - will admit to tele bed - Pain control: morphine prn and percocet - follow up ortho recs - NPO - type and cross - INR/PTT  COPD: stable -prn albuterol nebulizer  Atrial Fibrillation: CHA2DS2-VASc Score is 5, needs oral anticoagulation. Patient is not on at home due high risk of fall. Heart rate is well controlled -tele monitor -on ASA and metoprolol  CHF: No 2-D echo on record, not sure which type of congestive heart failure. Pt has 1+ pitting leg edema bilaterally, but no signs of  acute exacerbation. Chest x-ray has no pulmonary edema. -will hold lasix and spironolactone while patient is on NPO -check BNP  Hypertension: -Metoprolol -IV hydralazine when necessary  DVT ppx: SCD Code Status: DNR Family Communication:   Yes, patient's daughter  at bed side Disposition Plan: Admit to inpatient   Date of Service 09/09/2015    Ivor Costa [facility] Pager [phone]  If 7PM-7AM, please contact night-coverage [email or web address] Password [email or web address] 09/09/2015, 1:34 AM

## 2015-09-09 NOTE — H&P (View-Only) (Signed)
ORTHOPAEDIC CONSULTATION  REQUESTING PHYSICIAN: Domenic Polite, MD  PCP:  No primary care provider on file.  Chief Complaint: left hip pain  HPI: Mariah [name] is a 81 y.o. female who complains of  Left hip pain. Was carrying a box yesterday when she lost her balance and fell on her left side. C/o left hip pain and inability to WB. Admitted to hospitalist. I was asked to see the patient by Dr. Fredonia Highland. 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  Past Medical History  Diagnosis Date  . COPD (chronic obstructive pulmonary disease) ([place])   . Atrial fibrillation ([place])   . Hypertension   . [facility]) [facility]    Past Surgical History  Procedure Laterality Date  . Hernia repair     Social History   Social History  . Marital Status: Widowed    Spouse Name: N/A  . Number of Children: N/A  . Years of Education: N/A   Social History Main Topics  . Smoking status: Never Smoker   . Smokeless tobacco: None  . Alcohol Use: No  . Drug Use: No  . Sexual Activity: Not Asked   Other Topics Concern  . None   Social History Narrative   Family History  Problem Relation Age of Onset  . Prostate cancer Brother   . Cancer Father     Patient does not remember which type of cancer   Allergies  Allergen Reactions  . Lyrica [Pregabalin] Other (See Comments)    Caused her to be lethargic and her legs felt as if she could not stand  . Penicillins Rash    Has patient had a PCN reaction causing immediate rash, facial/tongue/throat swelling, SOB or lightheadedness with hypotension: YesYes Has patient had a PCN reaction causing severe rash involving mucus membranes or skin necrosis: NoNo Has patient had a PCN reaction that required hospitalization NoNo Has patient had a PCN reaction occurring within the last 10 years: NoNo If all of the above answers are "NO", then may proceed with Cephalosporin   Prior to Admission medications   Medication Sig Start Date End  Date Taking? Authorizing Provider  acetaminophen (TYLENOL) 500 MG tablet Take 1,000 mg by mouth 3 (three) times daily.   Yes Historical Provider, MD  albuterol (PROVENTIL) (2.5 MG/3ML) 0.083% nebulizer solution Take 2.5 mg by nebulization every 6 (six) hours as needed for wheezing or shortness of breath.   Yes Historical Provider, MD  aspirin EC 81 MG tablet Take 81 mg by mouth daily.   Yes Historical Provider, MD  brimonidine (ALPHAGAN P) 0.1 % SOLN Place 1 drop into both eyes 2 (two) times daily.   Yes Historical Provider, MD  carboxymethylcellulose (REFRESH PLUS) 0.5 % SOLN Place 2 drops into both eyes daily as needed (dry eyes).   Yes Historical Provider, MD  furosemide (LASIX) 40 MG tablet Take 40 mg by mouth daily.   Yes Historical Provider, MD  latanoprost (XALATAN) 0.005 % ophthalmic solution Place 1 drop into both eyes at bedtime.   Yes Historical Provider, MD  Melatonin 1 MG TABS Take 1 tablet by mouth daily.   Yes Historical Provider, MD  metoprolol succinate (TOPROL-XL) 50 MG 24 hr tablet Take 50 mg by mouth daily. Take with or immediately following a meal.   Yes Historical Provider, MD  mirtazapine (REMERON) 15 MG tablet Take 15 mg by mouth at bedtime as needed (sleep).   Yes Historical Provider, MD  PRESCRIPTION MEDICATION Apply 1 application  topically 2 (two) times daily as needed (pain).   Yes Historical Provider, MD  spironolactone (ALDACTONE) 25 MG tablet Take 12.5 mg by mouth daily.   Yes Historical Provider, MD  traMADol (ULTRAM) 50 MG tablet Take 50 mg by mouth every 6 (six) hours as needed for moderate pain.   Yes Historical Provider, MD   Dg Chest 2 View  09/08/2015  CLINICAL DATA:  Hip fracture EXAM: CHEST - 2 VIEW COMPARISON:  None. FINDINGS: Cardiac shadow is enlarged. The thoracic aorta is tortuous with calcification. No aneurysmal dilatation is seen. Blunting of left costophrenic angle is noted which may represent a small effusion but may be chronic in nature. No acute  bony abnormality is seen. IMPRESSION: Blunting of left costophrenic angle. Cardiomegaly. Electronically Signed   By: Inez Catalina M.D.   On: 09/08/2015 23:52   Dg Knee 1-2 Views Left  09/09/2015  CLINICAL DATA:  81 year old female with left hip fracture after falling yesterday. Pain radiates toward the knee. EXAM: LEFT KNEE - 1-2 VIEW COMPARISON:  None. FINDINGS: No evidence of acute fracture, malalignment or joint effusion. There is narrowing of the medial compartmental joint space consistent with osteoarthritis. Atherosclerotic calcifications noted in the superficial femoral artery and runoff vessels. IMPRESSION: 1. No acute fracture, malalignment or joint effusion. 2. Medial compartmental osteoarthritis. 3. Atherosclerotic vascular calcifications. Electronically Signed   By: Jacqulynn Cadet M.D.   On: 09/09/2015 07:39   Ct Head Wo Contrast  09/08/2015  CLINICAL DATA:  Fall today with blunt head trauma EXAM: CT HEAD WITHOUT CONTRAST TECHNIQUE: Contiguous axial images were obtained from the base of the skull through the vertex without intravenous contrast. COMPARISON:  None. FINDINGS: Bony calvarium is intact. No gross soft tissue abnormality is noted. Mild atrophic changes are seen. No findings to suggest acute hemorrhage, acute infarction or space-occupying mass lesion are noted. Changes of chronic white matter ischemic change are noted as well. IMPRESSION: Mild chronic atrophic and ischemic changes without acute abnormality. Electronically Signed   By: Inez Catalina M.D.   On: 09/08/2015 23:37   Dg Hip Unilat With Pelvis 2-3 Views Left  09/08/2015  CLINICAL DATA:  Fall today with left hip pain, initial encounter EXAM: DG HIP (WITH OR WITHOUT PELVIS) 2-3V LEFT COMPARISON:  None. FINDINGS: There is a comminuted fracture through the intratrochanteric region with impaction and angulation at the fracture site. The pelvic ring is intact. Calcified uterine fibroid is noted. IMPRESSION: Comminuted fracture  of the proximal left femur. Electronically Signed   By: Inez Catalina M.D.   On: 09/08/2015 23:53    Positive ROS: All other systems have been reviewed and were otherwise negative with the exception of those mentioned in the HPI and as above.  Physical Exam: General: Alert, no acute distress Cardiovascular: No pedal edema Respiratory: No cyanosis, no use of accessory musculature GI: No organomegaly, abdomen is soft and non-tender Skin: No lesions in the area of chief complaint Neurologic: Sensation intact distally Psychiatric: Patient is competent for consent with normal mood and affect Lymphatic: No axillary or cervical lymphadenopathy  MUSCULOSKELETAL: LLE shortened and externally rotated. Pain with logroll. + LE edema. NVI.   Assessment: L reverse obliquity IT femur fracture  Plan: I recommend IM nail fixation of left femur for pain control and to allow mobilization. We discussed the risks, beneftis, and alternatives to surgery.   The risks, benefits, and alternatives were discussed with the patient. There are risks associated with the surgery including, but not limited to, problems  with anesthesia (death), infection, differences in leg length/angulation/rotation, fracture of bones, loosening or failure of implants, hematoma (blood accumulation) which may require surgical drainage, blood clots, pulmonary embolism, nerve injury (foot drop), and blood vessel injury. The patient understands these risks and elects to proceed.  Plan for OR today   Lyla Glassing Horald Pollen, MD Cell [phone]    09/09/2015 7:45 AM

## 2015-09-09 NOTE — Progress Notes (Signed)
Pt seen and examined, 89/F admitted earlier this am by Dr.Niu L hip FX for OR today, please start DVT proph Post op if appropriate Continue metoprolol, resume lasix tomorrow Labs in am  Domenic Polite, MD [phone]

## 2015-09-09 NOTE — Progress Notes (Signed)
OT Cancellation Note  Patient Details Name: Mariah [name] MRN: [id number] DOB: Jul 14, 1919   Cancelled Treatment:    Reason Eval/Treat Not Completed: Other (comment) Pt with a femur fracture and plan is for OR today. Please reorder after surgery.  Jules Schick  [phone] 09/09/2015, 8:43 AM

## 2015-09-09 NOTE — Progress Notes (Signed)
PT Cancellation Note  Patient Details Name: Mariah [name] MRN: [id number] DOB: 03-Jan-1919   Cancelled Treatment:    Reason Eval/Treat Not Completed: Medical issues which prohibited therapy Pt with L reverse obliquity IT femur fracture and plan for OR today.  Please reorder after surgery.   Mariah [name],[name] 09/09/2015, 8:31 AM Carmelia Bake, PT, DPT 09/09/2015 Pager: [phone]

## 2015-09-10 DIAGNOSIS — S72002G Fracture of unspecified part of neck of left femur, subsequent encounter for closed fracture with delayed healing: Secondary | ICD-10-CM

## 2015-09-10 DIAGNOSIS — I480 Paroxysmal atrial fibrillation: Secondary | ICD-10-CM

## 2015-09-10 DIAGNOSIS — S72102D Unspecified trochanteric fracture of left femur, subsequent encounter for closed fracture with routine healing: Secondary | ICD-10-CM

## 2015-09-10 LAB — CBC
HEMATOCRIT: 23.8 % — AB (ref 36.0–46.0)
HEMOGLOBIN: 8 g/dL — AB (ref 12.0–15.0)
MCH: 33.6 pg (ref 26.0–34.0)
MCHC: 33.6 g/dL (ref 30.0–36.0)
MCV: 100 fL (ref 78.0–100.0)
Platelets: 89 10*3/uL — ABNORMAL LOW (ref 150–400)
RBC: 2.38 MIL/uL — AB (ref 3.87–5.11)
RDW: 18.1 % — ABNORMAL HIGH (ref 11.5–15.5)
WBC: 7.1 10*3/uL (ref 4.0–10.5)

## 2015-09-10 LAB — BASIC METABOLIC PANEL
Anion gap: 7 (ref 5–15)
BUN: 20 mg/dL (ref 6–20)
CHLORIDE: 104 mmol/L (ref 101–111)
CO2: 28 mmol/L (ref 22–32)
Calcium: 8.1 mg/dL — ABNORMAL LOW (ref 8.9–10.3)
Creatinine, Ser: 0.98 mg/dL (ref 0.44–1.00)
GFR calc Af Amer: 56 mL/min — ABNORMAL LOW (ref >60)
GFR calc non Af Amer: 48 mL/min — ABNORMAL LOW (ref >60)
GLUCOSE: 125 mg/dL — AB (ref 65–99)
POTASSIUM: 3.9 mmol/L (ref 3.5–5.1)
Sodium: 139 mmol/L (ref 135–145)

## 2015-09-10 MED ORDER — SODIUM CHLORIDE 0.9 % IV BOLUS (SEPSIS)
500.0000 mL | Freq: Once | INTRAVENOUS | Status: AC
  Administered 2015-09-10: 500 mL via INTRAVENOUS

## 2015-09-10 MED ORDER — SODIUM CHLORIDE 0.9 % IV BOLUS (SEPSIS)
250.0000 mL | Freq: Once | INTRAVENOUS | Status: AC
  Administered 2015-09-10: 250 mL via INTRAVENOUS

## 2015-09-10 MED ORDER — METOPROLOL TARTRATE 1 MG/ML IV SOLN
5.0000 mg | INTRAVENOUS | Status: DC | PRN
  Filled 2015-09-10: qty 5

## 2015-09-10 MED ORDER — SODIUM CHLORIDE 0.9 % IV BOLUS (SEPSIS)
500.0000 mL | Freq: Once | INTRAVENOUS | Status: AC
Start: 2015-09-10 — End: 2015-09-10
  Administered 2015-09-10: 500 mL via INTRAVENOUS

## 2015-09-10 MED ORDER — SODIUM CHLORIDE 0.9 % IV SOLN
INTRAVENOUS | Status: DC
  Administered 2015-09-10 – 2015-09-12 (×5): via INTRAVENOUS

## 2015-09-10 NOTE — Progress Notes (Signed)
Patient resting comfortably in bed with no complaints at this time. Her BP low 81/44 and HR 118. Notified Dr. Broadus John of these findings and  new orders received. Will continue to monitor and notify MD as needed.

## 2015-09-10 NOTE — Progress Notes (Signed)
Patient is resting well with a low blood pressure of 86/38 manually in right arm. Previous check with automatic dinamap. Patient bp has been in low-mid 17O systolic. Asymptomatic. Alert and oriented. Made on call Rogue Bussing aware. Orders given to bolus 500 ml and reassess. Will carry out order.

## 2015-09-10 NOTE — Progress Notes (Signed)
CSW received referral for New SNF.   CSW attempted to assess pt however, pt sleeping soundly at this time and no family present at bedside. Per chart review, pt oriented x 4.   CSW to attempt to assess pt at a later time for New SNF and complete full psychosocial assessment at that time.  CSW to continue to follow.  Alison Murray, MSW, LCSW Clinical Social Work Weekend coverage [phone]

## 2015-09-10 NOTE — Op Note (Signed)
NAMEMarland Kitchen  FARRIS, BLASH                  ACCOUNT NO.:  [id number]  MEDICAL RECORD NO.:  [id number]  LOCATION:  7517                         FACILITY:  [facility]  PHYSICIAN:  Rod Can, MD     DATE OF BIRTH:  [birth year]  DATE OF PROCEDURE:  09/09/2015 DATE OF DISCHARGE:                              OPERATIVE REPORT   SURGEON:  Rod Can, MD.  ASSISTANT:  Staff.  PREOPERATIVE DIAGNOSIS:  Left reverse obliquity intertrochanteric femur Fracture and basicervical femoral neck fracture.  POSTOPERATIVE DIAGNOSIS:  Left reverse obliquity intertrochanteric femur Fracture and basicervical femoral neck fracture.  PROCEDURE PERFORMED:  Cephalomedullary fixation of left pertrochanteric femur fracture.  IMPLANTS: 1. Synthes TFNA nail 11 x 380 mm, 130 degrees. 2. TFNA helical blade 90 mm. 3. 5.0 mm distal interlocking screw x1.  ANESTHESIA:  Spinal.  ESTIMATED BLOOD LOSS:  200 mL.  ANTIBIOTICS:  900 mg clindamycin.  COMPLICATIONS:  None.  SPECIMENS:  None.  DISPOSITION:  Stable to PACU.  INDICATIONS:  The patient is a 81 year old female, who was mostly a household ambulator.  She was bending over to pick up a box and she fell onto her left side.  She had left hip pain and inability to weight bear. She came to the emergency department.  X-rays revealed a left pertrochanteric femur fracture.  She was admitted by the hospitalist and underwent perioperative risk stratification, medical optimization.  Risks, benefits, and alternatives to surgical fixation were explained to the patient and her family and they elected to proceed.  DESCRIPTION OF PROCEDURE IN DETAIL:  I identified the patient using 2 identifiers in the holding area.  I marked the surgical site.  She was taken to the operating room.  Spinal anesthesia was induced on her bed. A Foley catheter was inserted.  She was then positioned on the Hana table.  The right lower extremity was scissored underneath the left.  I reduced  the fracture with traction, adduction, and internal rotation. The left lower extremity was prepped and draped in normal sterile surgical fashion.  Time-out was called verifying side and site of surgery.  She did receive IV antibiotics within 60 minutes of beginning of the procedure.  I began by using fluoroscopy to define her anatomy. I made a 4 cm incision proximal to the tip of the trochanter.  Using a guide pin, I established a standard starting point for a trochanteric nail, entry reamer was used.  I then placed a guidewire to the physeal scar of the knee.  I measured the length of the nail and then sequentially reamed up to a 12.5 mm with good chatter.  The real nail was selected and impacted into place.  Through a separate stab incision, I placed the guide pin through the cannula of the jig for the cephalomedullary device, alignment was checked with AP and lateral fluoroscopy views.  I then measured, reamed, and placed a real TFNA blade plate.  The fracture reduced very nicely.  Upon advancing the cannula down to the bone, she had near anatomic alignment.  I then tightened the set screw.  AP and lateral fluoroscopy views were used to confirm fracture reduction and a tip apex  distance.  There was no chondral penetration.  I then used a perfect circle technique distally to place one distal interlocking screw.  All final fluoroscopy views were then obtained.  I copiously irrigated the wounds with saline.  The wounds were closed in layered fashion using #1 Vicryl for the fascia, 2- 0 Vicryl for the deep fat, 2-0 Monocryl for the deep dermal layer, a running 3-0 Monocryl subcuticular stitch.  Glue was applied to the skin. Once the glue was fully dried, sterile dressings were applied.  The patient was then taken to PACU in stable condition.  Sponge, needle, and instrument counts were correct at the end of the case x2.  There were no complications.  I discussed operative events and  findings with the patient's family. She may weight bear as tolerated with a walker.  We will put her on full strength aspirin twice a day for DVT prophylaxis.  We will readmit her to the hospitalist.  She will work with physical and occupational therapy.  We will work on disposition planning.  I will see her back in the office in 2 weeks.  All questions solicited and answered to their satisfaction.          ______________________________ Rod Can, MD     BS/MEDQ  D:  09/09/2015  T:  09/09/2015  Job:  [id number]

## 2015-09-10 NOTE — Progress Notes (Signed)
Subjective: 1 Day Post-Op Procedure(s) (LRB): INTRAMEDULLARY (IM) NAIL FEMORAL (Left)  Patient reports pain as mild to moderate.  Tolerating POs well.  Denies fever, chills, N/V.  Denies BM, but admits to flatulence.    Objective:   VITALS:  Temp:  [97.5 F (36.4 C)-98.3 F (36.8 C)] 98.1 F (36.7 C) (10/22 0745) Pulse Rate:  [79-130] 130 (10/22 0451) Resp:  [11-20] 16 (10/22 0451) BP: (66-127)/(33-69) 95/46 mmHg (10/22 0745) SpO2:  [97 %-100 %] 100 % (10/22 0451) Weight:  [53.1 kg (117 lb 1 oz)] 53.1 kg (117 lb 1 oz) (10/22 0451)  General: WDWN patient in NAD. Psych:  Appropriate mood and affect. Neuro:  A&O x 3, Moving all extremities, sensation intact to light touch HEENT:  EOMs intact Chest:  Even non-labored respirations Skin: Dressing C/D/I, no rashes or lesions Extremities: warm/dry, mild edema, no erythema or echymosis.  No lymphadenopathy. Pulses: Popliteus 2+ MSK:  ROM: Full ankle ROM, MMT: patient can perform quad set, (-) Homan's    LABS  Recent Labs  09/08/15 2325 09/09/15 0506 09/10/15 0528  HGB 12.8 11.5* 8.0*  WBC 8.6 8.0 7.1  PLT 128* 123* 89*    Recent Labs  09/09/15 0506 09/10/15 0528  NA 142 139  K 3.9 3.9  CL 100* 104  CO2 32 28  BUN 21* 20  CREATININE 0.81 0.98  GLUCOSE 136* 125*    Recent Labs  09/08/15 2325  INR 1.14     Assessment/Plan: 1 Day Post-Op Procedure(s) (LRB): INTRAMEDULLARY (IM) NAIL FEMORAL (Left)  Up with therapy  WBAT LLE with walker ASA for DVT prophylaxis D/C determined via medicine team when patient is stable.  Mechele Claude, PA-C, ATC [employer] Office:  [phone]

## 2015-09-10 NOTE — Clinical Social Work Placement (Addendum)
   CLINICAL SOCIAL WORK PLACEMENT  NOTE  Date:  09/10/2015  Patient Details  Name: Mariah [name] MRN: [id number] Date of Birth: 1919-10-07  Clinical Social Work is seeking post-discharge placement for this patient at the [place] level of care (*CSW will initial, date and re-position this form in  chart as items are completed):  Yes   Patient/family provided with [place] Work Department's list of facilities offering this level of care within the geographic area requested by the patient (or if unable, by the patient's family).  Yes   Patient/family informed of their freedom to choose among providers that offer the needed level of care, that participate in Medicare, Medicaid or managed care program needed by the patient, have an available bed and are willing to accept the patient.  Yes   Patient/family informed of [facility]'s ownership interest in [facility] and [facility], as well as of the fact that they are under no obligation to receive care at these facilities.  PASRR submitted to EDS on       PASRR number received on       Existing PASRR number confirmed on 09/10/15     FL2 transmitted to all facilities in geographic area requested by pt/family on 09/10/15     FL2 transmitted to all facilities within larger geographic area on       Patient informed that his/her managed care company has contracts with or will negotiate with certain facilities, including the following:          09/11/2015   Patient/family informed of bed offers received.  Patient chooses bed at  [facility]      Physician recommends and patient chooses bed at      Patient to be transferred to  [facility]  on   09/13/2015.  Patient to be transferred to facility by  [facility]     Patient family notified on   09/13/2015   of transfer.  Name of family member notified:    daughterVaughan Basta   PHYSICIAN Please sign FL2, Please sign DNR     Additional Comment:     _______________________________________________  Eduard Clos, MSW, [place]

## 2015-09-10 NOTE — Clinical Documentation Improvement (Signed)
Hospitalist  Can the diagnosis of CHF be further specified?    Acuity - Acute, Chronic, Acute on Chronic   Type - Systolic, Diastolic, Systolic and Diastolic  Other  Clinically Undetermined   Document any associated diagnoses/conditions   Supporting Information: History of CHF, not sure which type per 10/21 progress notes. CHF, likely compensated, hold lasix and spironolactone while patient is NPO per 10/22 progress notes.    10/21:  BNP= 370.4.   Please exercise your independent, professional judgment when responding. A specific answer is not anticipated or expected.   Thank You,  [place] [phone]

## 2015-09-10 NOTE — Progress Notes (Signed)
Patient BP 84/50 asymptomatic at this time notified Dr. Broadus John and new orders received.

## 2015-09-10 NOTE — Clinical Social Work Note (Signed)
Clinical Social Work Assessment  Patient Details  Name: Mariah [name] MRN: [id number] Date of Birth: 05/20/1919  Date of referral:  09/10/15               Reason for consult:  Discharge Planning                Permission sought to share information with:  Family Supports Permission granted to share information::  Yes, Verbal Permission Granted  Name::     [place]  Agency::     Relationship::  daughter  Contact Information:  [phone]  Housing/Transportation Living arrangements for the past 2 months:  Single Family Home Source of Information:  Patient, Other (Comment Required) (granddaughter present at bedside) Patient Interpreter Needed:  None Criminal Activity/Legal Involvement Pertinent to Current Situation/Hospitalization:  No - Comment as needed Significant Relationships:  Adult Children Lives with:  Adult Children Do you feel safe going back to the place where you live?   (dependent on PT eval) Need for family participation in patient care:  Yes (Comment)  Care giving concerns:  Pt admitted from home where pt lives with pt daughter. Pt admitted with hip fracture s/p surgery.   Social Worker assessment / plan:  CSW received referral for new SNF-PT eval pending, but pt admitted with hip fracture.  CSW met with pt, pt granddaughter and her family at bedside. CSW introduced self and explained role. Pt reports that she lives at home with her daughter. Pt granddaughter stated that someone is at the home 24 hours a day and only run out for errands. CSW discussed with pt and pt family that recommendation may be for short term rehab at SNF unless pt family feels they can provide pt care at home once PT evaluates pt. Pt and pt family expressed understanding. Pt and pt family would like to see how pt does with PT to make final decision about discharge plan, but agreeable to [facility] search. Pt and pt family expressed that they are familiar with process as pt has been to  [facility]	[place] and [facility] [place] in the past.   CSW completed FL2 and initiated SNF search to [facility].  CSW to follow up with pt and pt family re: SNF bed offers.   CSW to continue to follow to provide support and assist with pt disposition needs.   Employment status:  Retired Forensic scientist:  Medicare PT Recommendations:  [place] / Referral to community resources:  [place]  Patient/Family's Response to care:  Pt alert and oriented x 4. Pt pleasant and actively involved in conversation. Pt open to SNF if recommended by PT, but pt has support at home, so discharge plan will depend on how pt does with PT eval.   Patient/Family's Understanding of and Emotional Response to Diagnosis, Current Treatment, and Prognosis:  Pt discussed with CSW about recommendation from MD to not get out of the bed today, but pt willing to participate with PT once pt is cleared by MD to participate.  Emotional Assessment Appearance:  Appears stated age Attitude/Demeanor/Rapport:  Other (pt appropriate) Affect (typically observed):  Appropriate, Pleasant Orientation:  Oriented to Self, Oriented to Place, Oriented to  Time, Oriented to Situation Alcohol / Substance use:  Not Applicable Psych involvement (Current and /or in the community):  No (Comment)  Discharge Needs  Concerns to be addressed:  Discharge Planning Concerns Readmission within the last 30 days:  No Current discharge risk:  None Barriers to Discharge:  Continued Medical Work up   Ladell Pier, LCSW 09/10/2015, 3:48 PM Weekend coverage [phone]

## 2015-09-10 NOTE — Progress Notes (Signed)
[facility] PROGRESS NOTE  Mariah [name] XTG:[id number] DOB: June 29, 1919 DOA: 09/08/2015 PCP: No primary care provider on file.  Assessment/Plan: Closed left hip fracture  -s/p ORIF 10/21 -started on ASA BID per Orthopedics for DVT prophylaxis -PT consult, remove foley tomorrow  Acute Blood loss anemia -monitor, Hb down from 11-8,  -if  trends down more will give 1-2 units PRBC  Atrial Fibrillation with RVR -IV metoprolol and Toprol this am, may need Cardizem gtt or Digoxin if HR uncontrolled -CHA2DS2-VASc Score is 5, needs oral anticoagulation. Patient is not on at home due high risk of fall. Heart rate is well controlled -continue ASA   COPD: stable -prn albuterol nebulizer  CHF:  -likely compensated,  Chest x-ray has no pulmonary edema. -hold lasix and spironolactone while patient is on NPO  Hypertension: now hypotensive but not symptomatic -Metoprolol with holding parameters -BP on lower side today, hydrate , monitor  Code Status: DNR Family Communication: none at bedside Disposition Plan: SNF when stable   Consultants:  Orthopedics  Procedures: PROCEDURE: Cephalomedullary fixation of left intertrochanteric femur fracture.  HPI/Subjective: Feels ok, AFib with RVR, breathing ok, pain at surg site  Objective: Filed Vitals:   09/10/15 1040  BP: 81/42  Pulse:   Temp:   Resp:     Intake/Output Summary (Last 24 hours) at 09/10/15 1112 Last data filed at 09/10/15 0453  Gross per 24 hour  Intake 2508.34 ml  Output   1150 ml  Net 1358.34 ml   Filed Weights   09/09/15 0213 09/10/15 0451  Weight: 51.5 kg (113 lb 8.6 oz) 53.1 kg (117 lb 1 oz)    Exam:   General:  AAOx3  Cardiovascular: S1S2/Irregular rate   Respiratory: CTAB  Abdomen: soft, NT, BS present  Musculoskeletal: L hip with incision c/d/i, 1plus edema  Data Reviewed: Basic Metabolic Panel:  Recent Labs Lab 09/08/15 2325 09/09/15 0506 09/10/15 0528  NA 139 142 139  K 3.6  3.9 3.9  CL 100* 100* 104  CO2 29 32 28  GLUCOSE 117* 136* 125*  BUN 23* 21* 20  CREATININE 0.88 0.81 0.98  CALCIUM 9.5 9.3 8.1*   Liver Function Tests:  Recent Labs Lab 09/08/15 2325  AST 26  ALT 11*  ALKPHOS 56  BILITOT 1.3*  PROT 6.9  ALBUMIN 4.1   No results for input(s): LIPASE, AMYLASE in the last 168 hours. No results for input(s): AMMONIA in the last 168 hours. CBC:  Recent Labs Lab 09/08/15 2325 09/09/15 0506 09/10/15 0528  WBC 8.6 8.0 7.1  NEUTROABS 6.5  --   --   HGB 12.8 11.5* 8.0*  HCT 38.8 35.4* 23.8*  MCV 99.7 100.3* 100.0  PLT 128* 123* 89*   Cardiac Enzymes: No results for input(s): CKTOTAL, CKMB, CKMBINDEX, TROPONINI in the last 168 hours. BNP (last 3 results)  Recent Labs  09/09/15 0506  BNP 370.4*    ProBNP (last 3 results) No results for input(s): PROBNP in the last 8760 hours.  CBG:  Recent Labs Lab 09/09/15 0732  GLUCAP 118*    No results found for this or any previous visit (from the past 240 hour(s)).   Studies: Dg Chest 2 View  09/08/2015  CLINICAL DATA:  Hip fracture EXAM: CHEST - 2 VIEW COMPARISON:  None. FINDINGS: Cardiac shadow is enlarged. The thoracic aorta is tortuous with calcification. No aneurysmal dilatation is seen. Blunting of left costophrenic angle is noted which may represent a small effusion but may be chronic in nature. No acute  bony abnormality is seen. IMPRESSION: Blunting of left costophrenic angle. Cardiomegaly. Electronically Signed   By: Inez Catalina M.D.   On: 09/08/2015 23:52   Dg Knee 1-2 Views Left  09/09/2015  CLINICAL DATA:  81 year old female with left hip fracture after falling yesterday. Pain radiates toward the knee. EXAM: LEFT KNEE - 1-2 VIEW COMPARISON:  None. FINDINGS: No evidence of acute fracture, malalignment or joint effusion. There is narrowing of the medial compartmental joint space consistent with osteoarthritis. Atherosclerotic calcifications noted in the superficial femoral artery  and runoff vessels. IMPRESSION: 1. No acute fracture, malalignment or joint effusion. 2. Medial compartmental osteoarthritis. 3. Atherosclerotic vascular calcifications. Electronically Signed   By: Jacqulynn Cadet M.D.   On: 09/09/2015 07:39   Ct Head Wo Contrast  09/08/2015  CLINICAL DATA:  Fall today with blunt head trauma EXAM: CT HEAD WITHOUT CONTRAST TECHNIQUE: Contiguous axial images were obtained from the base of the skull through the vertex without intravenous contrast. COMPARISON:  None. FINDINGS: Bony calvarium is intact. No gross soft tissue abnormality is noted. Mild atrophic changes are seen. No findings to suggest acute hemorrhage, acute infarction or space-occupying mass lesion are noted. Changes of chronic white matter ischemic change are noted as well. IMPRESSION: Mild chronic atrophic and ischemic changes without acute abnormality. Electronically Signed   By: Inez Catalina M.D.   On: 09/08/2015 23:37   Pelvis Portable  09/09/2015  CLINICAL DATA:  STAT READ. CALL REPORT TO [id number]. Status post op left femoral IM rod. Imaging done in [facility] PACU. EXAM: PORTABLE PELVIS 1-2 VIEWS COMPARISON:  None. FINDINGS: Interval placement of a left intra medullary nail interlocking cannulated femoral neck screw transfixing a comminuted intertrochanteric fracture. There is medial displacement of the lesser trochanteric fracture fragment. There is 8 mm of lateral displacement of the greater trochanteric fragment relative to the subtrochanteric femur. There is 8 mm gap at the medial femoral neck and intertrochanteric region. There is no right hip fracture or dislocation. There is generalized osteopenia. There degenerative changes of the lower lumbar spine. There is a calcified pelvic mass most consistent with a uterine fibroid. IMPRESSION: Interval ORIF left intertrochanteric fracture as detailed above. Electronically Signed   By: Kathreen Devoid   On: 09/09/2015 14:44   Dg C-arm 61-120 Min-no Report  09/09/2015   CLINICAL DATA: surgery C-ARM 61-120 MINUTES Fluoroscopy was utilized by the requesting physician.  No radiographic interpretation.   Dg Hip Unilat With Pelvis 2-3 Views Left  09/08/2015  CLINICAL DATA:  Fall today with left hip pain, initial encounter EXAM: DG HIP (WITH OR WITHOUT PELVIS) 2-3V LEFT COMPARISON:  None. FINDINGS: There is a comminuted fracture through the intratrochanteric region with impaction and angulation at the fracture site. The pelvic ring is intact. Calcified uterine fibroid is noted. IMPRESSION: Comminuted fracture of the proximal left femur. Electronically Signed   By: Inez Catalina M.D.   On: 09/08/2015 23:53   Dg Femur Min 2 Views Left  09/09/2015  CLINICAL DATA:  ORIF left hip fracture EXAM: LEFT FEMUR 2 VIEWS COMPARISON:  None. FINDINGS: Interval placement of a left intra medullary nail interlocking cannulated femoral neck screw transfixing a comminuted intertrochanteric fracture. There is medial displacement of the lesser trochanteric fracture fragment. There is 8 mm of lateral displacement of the greater trochanteric fragment relative to the subtrochanteric femur. There is 8 mm gap at the medial femoral neck and intertrochanteric region. There are postsurgical changes in the surrounding soft tissues. There is medial femorotibial compartment joint space  narrowing and marginal osteophytosis. There is a calcified pelvic mass most consistent with a uterine fibroid. IMPRESSION: Interval ORIF left intertrochanteric fracture as detailed above. Electronically Signed   By: Kathreen Devoid   On: 09/09/2015 14:46   Dg Femur Min 2 Views Left  09/09/2015  CLINICAL DATA:  Intertrochanteric hip fracture fixation EXAM: DG C-ARM 61-120 MIN-NO REPORT; LEFT FEMUR 2 VIEWS COMPARISON:  09/08/2015 FINDINGS: There is a long gamma nail, a proximal dynamic hip screw and a single distal interlocking screw transfixing the intertrochanteric fracture with near anatomic reduction. IMPRESSION: Internal  fixation of left hip fracture with anatomic reduction and no complicating features. Electronically Signed   By: Marijo Sanes M.D.   On: 09/09/2015 13:27    Scheduled Meds: . aspirin EC  325 mg Oral BID PC  . brimonidine  1 drop Both Eyes BID  . latanoprost  1 drop Both Eyes QHS  . levofloxacin (LEVAQUIN) IV  250 mg Intravenous Daily  . metoprolol succinate  50 mg Oral Daily   Continuous Infusions: . sodium chloride 100 mL/hr at 09/10/15 0807   Antibiotics Given (last 72 hours)    Date/Time Action Medication Dose Rate   09/09/15 1030 Given   Levofloxacin (LEVAQUIN) IVPB 250 mg 250 mg 50 mL/hr   09/09/15 1150 Given   clindamycin (CLEOCIN) IVPB 900 mg 900 mg    09/09/15 1817 Given   clindamycin (CLEOCIN) IVPB 600 mg 600 mg 100 mL/hr   09/10/15 0044 Given   clindamycin (CLEOCIN) IVPB 600 mg 600 mg 100 mL/hr   09/10/15 3202 Given   Levofloxacin (LEVAQUIN) IVPB 250 mg 250 mg 50 mL/hr      Principal Problem:   Closed left hip fracture (HCC) Active Problems:   COPD (chronic obstructive pulmonary disease) (HCC)   Atrial fibrillation (HCC)   Hypertension   CHF (congestive heart failure) ([place])   Closed pertrochanteric fracture ([place])    Time spent: 73min    Mariah [name]  [facility] Pager [phone]. If 7PM-7AM, please contact night-coverage at [email or web address], password [facility] 09/10/2015, 11:12 AM  LOS: 1 day

## 2015-09-10 NOTE — Progress Notes (Signed)
PT Cancellation Note  Patient Details Name: Mariah [name] MRN: [id number] DOB: 02-03-1919   Cancelled Treatment:     PT deferred this am at request of nursing - pt currently in a-fib.  Will follow.   Travarius Lange 09/10/2015, 9:43 AM

## 2015-09-10 NOTE — Progress Notes (Signed)
Patient received bolus with minimal change in BP. Manuelly BP remain in 80s. Patient received an additional order for 500 ml bolus. Rechecked BP remained soft.  Patient Mariah [name] s/p Hip pinning 10/21 . Will cont to monitor.

## 2015-09-10 NOTE — Progress Notes (Addendum)
BP recheck 92/42. Will cont to monitor closely.

## 2015-09-10 NOTE — Progress Notes (Signed)
Patient HR has been nonsustained 115-150's sporadically then will go back to 90-110 - Patient resting in bed. Not exerting self. Patient remains alert and oriented and is not complaining of any pain (chest) Patient has been in Afib. Which is part of her history. Made on call aware of HR as well as BP. He came to bedside. No orders given for medications for HR at this time due to  Hypotensive BP. Sbp 80-90. Will cont to monitor and make on coming Team aware.

## 2015-09-11 DIAGNOSIS — I10 Essential (primary) hypertension: Secondary | ICD-10-CM

## 2015-09-11 LAB — BASIC METABOLIC PANEL
Anion gap: 7 (ref 5–15)
BUN: 24 mg/dL — AB (ref 6–20)
CHLORIDE: 106 mmol/L (ref 101–111)
CO2: 25 mmol/L (ref 22–32)
CREATININE: 1.06 mg/dL — AB (ref 0.44–1.00)
Calcium: 7.9 mg/dL — ABNORMAL LOW (ref 8.9–10.3)
GFR calc Af Amer: 51 mL/min — ABNORMAL LOW (ref >60)
GFR calc non Af Amer: 44 mL/min — ABNORMAL LOW (ref >60)
GLUCOSE: 118 mg/dL — AB (ref 65–99)
Potassium: 4.2 mmol/L (ref 3.5–5.1)
SODIUM: 138 mmol/L (ref 135–145)

## 2015-09-11 LAB — CBC
HCT: 20.3 % — ABNORMAL LOW (ref 36.0–46.0)
HEMOGLOBIN: 6.8 g/dL — AB (ref 12.0–15.0)
MCH: 34 pg (ref 26.0–34.0)
MCHC: 33.5 g/dL (ref 30.0–36.0)
MCV: 101.5 fL — AB (ref 78.0–100.0)
Platelets: 88 10*3/uL — ABNORMAL LOW (ref 150–400)
RBC: 2 MIL/uL — ABNORMAL LOW (ref 3.87–5.11)
RDW: 18.5 % — ABNORMAL HIGH (ref 11.5–15.5)
WBC: 8.4 10*3/uL (ref 4.0–10.5)

## 2015-09-11 LAB — PREPARE RBC (CROSSMATCH)

## 2015-09-11 MED ORDER — POLYETHYLENE GLYCOL 3350 17 G PO PACK
17.0000 g | PACK | Freq: Every day | ORAL | Status: DC
  Administered 2015-09-11: 17 g via ORAL
  Filled 2015-09-11: qty 1

## 2015-09-11 MED ORDER — SODIUM CHLORIDE 0.9 % IV BOLUS (SEPSIS)
500.0000 mL | Freq: Once | INTRAVENOUS | Status: AC
  Administered 2015-09-11: 500 mL via INTRAVENOUS

## 2015-09-11 MED ORDER — SODIUM CHLORIDE 0.9 % IV SOLN
Freq: Once | INTRAVENOUS | Status: AC
  Administered 2015-09-11: 11:00:00 via INTRAVENOUS

## 2015-09-11 MED ORDER — LEVOFLOXACIN 500 MG PO TABS
250.0000 mg | ORAL_TABLET | Freq: Every day | ORAL | Status: DC
  Administered 2015-09-11: 250 mg via ORAL
  Filled 2015-09-11: qty 1

## 2015-09-11 MED ORDER — SODIUM CHLORIDE 0.9 % IV SOLN: Freq: Once | INTRAVENOUS | Status: DC

## 2015-09-11 NOTE — Progress Notes (Signed)
CRITICAL VALUE ALERT  Critical value received:  Hgb 6.8  Date of notification:  09/11/2015  Time of notification:  0600  Critical value read back:Yes.    Nurse who received alert:  Joyce Copa, RN   MD notified (1st page):  Rogue Bussing, NP  Time of first page:  0620  MD notified (2nd page):  Time of second page:  Responding MD:  Rogue Bussing, NP   Time MD responded:  [phone]

## 2015-09-11 NOTE — Progress Notes (Addendum)
[facility] PROGRESS NOTE  Mariah [name] EHU:[id number] DOB: 07-29-1919 DOA: 09/08/2015 PCP: No primary care provider on file.  Assessment/Plan: Closed left hip fracture  -s/p ORIF 10/21 -started on ASA BID per Orthopedics for DVT prophylaxis -PT consult, leave foley in due to poor urine output  Acute Blood loss anemia -monitor, Hb down from 11 to 6.8 -component of Hemodilution too -will give 2 units PRBC today  Atrial Fibrillation with RVR -IV metoprolol and Toprol this am, may need Cardizem gtt or Digoxin if HR uncontrolled -CHA2DS2-VASc Score is 5, not on anticoagulation due high risk of fall. Heart rate is better today -continue ASA   COPD: stable -prn albuterol nebulizer  CHF:  -likely diastolic, compensated,  Chest x-ray has no pulmonary edema. -hold lasix and spironolactone, no ECHo in system -resume diuretics when appropriate  Hypertension: BP soft but not symptomatic -Metoprolol with holding parameters -BP still on lower side and urine output poor, continue IVF, Blood today -monitor  UTI -has been on Ceftriaxone x3days -change to PO Abx, urine Cx were not sent  Code Status: DNR Family Communication: none at bedside Disposition Plan: SNF when stable   Consultants:  Orthopedics  Procedures: PROCEDURE: Cephalomedullary fixation of left intertrochanteric femur fracture.  HPI/Subjective:  pain at Surg site, breathing ok  Objective: Filed Vitals:   09/11/15 1049  BP: 99/79  Pulse: 117  Temp: 97.5 F (36.4 C)  Resp:     Intake/Output Summary (Last 24 hours) at 09/11/15 1107 Last data filed at 09/11/15 1002  Gross per 24 hour  Intake 3128.33 ml  Output    225 ml  Net 2903.33 ml   Filed Weights   09/09/15 0213 09/10/15 0451 09/11/15 0500  Weight: 51.5 kg (113 lb 8.6 oz) 53.1 kg (117 lb 1 oz) 58.1 kg (128 lb 1.4 oz)    Exam:   General:  AAOx3  Cardiovascular: S1S2/Irregular rate   Respiratory: CTAB  Abdomen: soft, NT, BS  present  Musculoskeletal: L hip with incision c/d/i, 1plus edema  Data Reviewed: Basic Metabolic Panel:  Recent Labs Lab 09/08/15 2325 09/09/15 0506 09/10/15 0528 09/11/15 0513  NA 139 142 139 138  K 3.6 3.9 3.9 4.2  CL 100* 100* 104 106  CO2 29 32 28 25  GLUCOSE 117* 136* 125* 118*  BUN 23* 21* 20 24*  CREATININE 0.88 0.81 0.98 1.06*  CALCIUM 9.5 9.3 8.1* 7.9*   Liver Function Tests:  Recent Labs Lab 09/08/15 2325  AST 26  ALT 11*  ALKPHOS 56  BILITOT 1.3*  PROT 6.9  ALBUMIN 4.1   No results for input(s): LIPASE, AMYLASE in the last 168 hours. No results for input(s): AMMONIA in the last 168 hours. CBC:  Recent Labs Lab 09/08/15 2325 09/09/15 0506 09/10/15 0528 09/11/15 0513  WBC 8.6 8.0 7.1 8.4  NEUTROABS 6.5  --   --   --   HGB 12.8 11.5* 8.0* 6.8*  HCT 38.8 35.4* 23.8* 20.3*  MCV 99.7 100.3* 100.0 101.5*  PLT 128* 123* 89* 88*   Cardiac Enzymes: No results for input(s): CKTOTAL, CKMB, CKMBINDEX, TROPONINI in the last 168 hours. BNP (last 3 results)  Recent Labs  09/09/15 0506  BNP 370.4*    ProBNP (last 3 results) No results for input(s): PROBNP in the last 8760 hours.  CBG:  Recent Labs Lab 09/09/15 0732  GLUCAP 118*    No results found for this or any previous visit (from the past 240 hour(s)).   Studies: Pelvis Portable  09/09/2015  CLINICAL DATA:  STAT READ. CALL REPORT TO [id number]. Status post op left femoral IM rod. Imaging done in [facility] PACU. EXAM: PORTABLE PELVIS 1-2 VIEWS COMPARISON:  None. FINDINGS: Interval placement of a left intra medullary nail interlocking cannulated femoral neck screw transfixing a comminuted intertrochanteric fracture. There is medial displacement of the lesser trochanteric fracture fragment. There is 8 mm of lateral displacement of the greater trochanteric fragment relative to the subtrochanteric femur. There is 8 mm gap at the medial femoral neck and intertrochanteric region. There is no right hip fracture  or dislocation. There is generalized osteopenia. There degenerative changes of the lower lumbar spine. There is a calcified pelvic mass most consistent with a uterine fibroid. IMPRESSION: Interval ORIF left intertrochanteric fracture as detailed above. Electronically Signed   By: Kathreen Devoid   On: 09/09/2015 14:44   Dg C-arm 61-120 Min-no Report  09/09/2015  CLINICAL DATA: surgery C-ARM 61-120 MINUTES Fluoroscopy was utilized by the requesting physician.  No radiographic interpretation.   Dg Femur Min 2 Views Left  09/09/2015  CLINICAL DATA:  ORIF left hip fracture EXAM: LEFT FEMUR 2 VIEWS COMPARISON:  None. FINDINGS: Interval placement of a left intra medullary nail interlocking cannulated femoral neck screw transfixing a comminuted intertrochanteric fracture. There is medial displacement of the lesser trochanteric fracture fragment. There is 8 mm of lateral displacement of the greater trochanteric fragment relative to the subtrochanteric femur. There is 8 mm gap at the medial femoral neck and intertrochanteric region. There are postsurgical changes in the surrounding soft tissues. There is medial femorotibial compartment joint space narrowing and marginal osteophytosis. There is a calcified pelvic mass most consistent with a uterine fibroid. IMPRESSION: Interval ORIF left intertrochanteric fracture as detailed above. Electronically Signed   By: Kathreen Devoid   On: 09/09/2015 14:46   Dg Femur Min 2 Views Left  09/09/2015  CLINICAL DATA:  Intertrochanteric hip fracture fixation EXAM: DG C-ARM 61-120 MIN-NO REPORT; LEFT FEMUR 2 VIEWS COMPARISON:  09/08/2015 FINDINGS: There is a long gamma nail, a proximal dynamic hip screw and a single distal interlocking screw transfixing the intertrochanteric fracture with near anatomic reduction. IMPRESSION: Internal fixation of left hip fracture with anatomic reduction and no complicating features. Electronically Signed   By: Marijo Sanes M.D.   On: 09/09/2015 13:27     Scheduled Meds: . sodium chloride   Intravenous Once  . aspirin EC  325 mg Oral BID PC  . brimonidine  1 drop Both Eyes BID  . latanoprost  1 drop Both Eyes QHS  . levofloxacin  250 mg Oral Daily  . metoprolol succinate  50 mg Oral Daily  . polyethylene glycol  17 g Oral Daily   Continuous Infusions: . sodium chloride Stopped (09/11/15 1101)   Antibiotics Given (last 72 hours)    Date/Time Action Medication Dose Rate   09/09/15 1030 Given   Levofloxacin (LEVAQUIN) IVPB 250 mg 250 mg 50 mL/hr   09/09/15 1150 Given   clindamycin (CLEOCIN) IVPB 900 mg 900 mg    09/09/15 1817 Given   clindamycin (CLEOCIN) IVPB 600 mg 600 mg 100 mL/hr   09/10/15 0044 Given   clindamycin (CLEOCIN) IVPB 600 mg 600 mg 100 mL/hr   09/10/15 5621 Given   Levofloxacin (LEVAQUIN) IVPB 250 mg 250 mg 50 mL/hr   09/11/15 0956 Given   levofloxacin (LEVAQUIN) tablet 250 mg 250 mg       Principal Problem:   Closed left hip fracture (HCC) Active Problems:   COPD (chronic  obstructive pulmonary disease) (HCC)   Atrial fibrillation ([place])   Hypertension   CHF (congestive heart failure) ([place])   Closed pertrochanteric fracture ([place])    Time spent: 40min    Chuckie Mccathern  [facility] Pager [phone]. If 7PM-7AM, please contact night-coverage at [email or web address], password [facility] 09/11/2015, 11:07 AM  LOS: 2 days

## 2015-09-11 NOTE — Progress Notes (Signed)
PT Cancellation Note  Patient Details Name: Mariah [name] MRN: [id number] DOB: 1919/09/21   Cancelled Treatment:    Reason Eval/Treat Not Completed: Medical issues which prohibited therapy (low hgb/BP)   Claretha Cooper 09/11/2015, 7:13 AM Tresa Endo PT [phone]

## 2015-09-11 NOTE — Progress Notes (Signed)
Subjective: 2 Days Post-Op Procedure(s) (LRB): INTRAMEDULLARY (IM) NAIL FEMORAL (Left) Patient reports pain as 4 on 0-10 scale.   C/O pain last pm partially relieved by po med. Also  no BM. Objective: Vital signs in last 24 hours: Temp:  [97.8 F (36.6 C)-98.5 F (36.9 C)] 98.5 F (36.9 C) (10/23 0625) Pulse Rate:  [85-102] 98 (10/23 0625) Resp:  [18] 18 (10/23 0625) BP: (76-106)/(42-80) 106/68 mmHg (10/23 0625) SpO2:  [100 %] 100 % (10/23 0625) FiO2 (%):  [2 %] 2 % (10/23 0625) Weight:  [58.1 kg (128 lb 1.4 oz)] 58.1 kg (128 lb 1.4 oz) (10/23 0500)  Intake/Output from previous day: 10/22 0701 - 10/23 0700 In: 2988.3 [I.V.:2888.3; IV Piggyback:100] Out: 100 [Urine:100] Intake/Output this shift:     Recent Labs  09/08/15 2325 09/09/15 0506 09/10/15 0528 09/11/15 0513  HGB 12.8 11.5* 8.0* 6.8*    Recent Labs  09/10/15 0528 09/11/15 0513  WBC 7.1 8.4  RBC 2.38* 2.00*  HCT 23.8* 20.3*  PLT 89* 88*    Recent Labs  09/10/15 0528 09/11/15 0513  NA 139 138  K 3.9 4.2  CL 104 106  CO2 28 25  BUN 20 24*  CREATININE 0.98 1.06*  GLUCOSE 125* 118*  CALCIUM 8.1* 7.9*    Recent Labs  09/08/15 2325  INR 1.14    Sensation intact distally Intact pulses distally Dorsiflexion/Plantar flexion intact Incision: dressing C/D/I  Assessment/Plan: 2 Days Post-Op Procedure(s) (LRB): INTRAMEDULLARY (IM) NAIL FEMORAL (Left) Up with therapy  Anemia noted to be transfused. Will see again tomorrow.  Jeniffer Culliver ANDREW 09/11/2015, 9:47 AM

## 2015-09-11 NOTE — Progress Notes (Signed)
PHARMACIST - PHYSICIAN COMMUNICATION DR:   TR[facility] CONCERNING: Antibiotic IV to Oral Route Change Policy  RECOMMENDATION: This patient is receiving levofloxacin by the intravenous route.  Based on criteria approved by the Pharmacy and Therapeutics Committee, the antibiotic(s) is/are being converted to the equivalent oral dose form(s).   DESCRIPTION: These criteria include:  Patient being treated for a respiratory tract infection, urinary tract infection, cellulitis or clostridium difficile associated diarrhea if on metronidazole  The patient is not neutropenic and does not exhibit a GI malabsorption state  The patient is eating (either orally or via tube) and/or has been taking other orally administered medications for a least 24 hours  The patient is improving clinically and has a Tmax < 100.5  If you have questions about this conversion, please contact the Pharmacy Department  []   [phone] )  [facility] []   [phone] )  [facility] []   [phone] )  [facility] []   [phone] )  [facility] [x]   [phone] )  [place], PharmD, BCPS.   Pager: [phone] 09/11/2015 8:17 AM

## 2015-09-11 NOTE — Progress Notes (Signed)
OT Cancellation Note  Patient Details Name: Mariah [name] MRN: [id number] DOB: 10/10/1919   Cancelled Treatment:    Reason Eval/Treat Not Completed: Medical issues which prohibited therapy.  Pt's Hgb 6.8.  Will check back.  Brixon Zhen 09/11/2015, 7:16 AM  Lesle Chris, OTR/L [phone] 09/11/2015

## 2015-09-11 NOTE — Progress Notes (Signed)
Patient  had 200 ml total output last night. Patient received 2 boluses of 500 mls each for low blood pressure. Patient also received 100 ml/hr of continuous fluid. Day RN aware of situation, awaiting patients attending/care team to become available for page. Currently displaying "OPEN". Patient is not symptomatic of fluid overload.  Zophia Marrone, RN

## 2015-09-12 LAB — BASIC METABOLIC PANEL
ANION GAP: 5 (ref 5–15)
BUN: 21 mg/dL — ABNORMAL HIGH (ref 6–20)
CALCIUM: 8.1 mg/dL — AB (ref 8.9–10.3)
CHLORIDE: 109 mmol/L (ref 101–111)
CO2: 24 mmol/L (ref 22–32)
CREATININE: 0.88 mg/dL (ref 0.44–1.00)
GFR calc non Af Amer: 55 mL/min — ABNORMAL LOW (ref >60)
Glucose, Bld: 103 mg/dL — ABNORMAL HIGH (ref 65–99)
Potassium: 4.2 mmol/L (ref 3.5–5.1)
SODIUM: 138 mmol/L (ref 135–145)

## 2015-09-12 LAB — CBC
HEMATOCRIT: 26.7 % — AB (ref 36.0–46.0)
HEMOGLOBIN: 9 g/dL — AB (ref 12.0–15.0)
MCH: 32.8 pg (ref 26.0–34.0)
MCHC: 33.7 g/dL (ref 30.0–36.0)
MCV: 97.4 fL (ref 78.0–100.0)
Platelets: 98 10*3/uL — ABNORMAL LOW (ref 150–400)
RBC: 2.74 MIL/uL — ABNORMAL LOW (ref 3.87–5.11)
RDW: 20.4 % — AB (ref 11.5–15.5)
WBC: 7.6 10*3/uL (ref 4.0–10.5)

## 2015-09-12 LAB — TYPE AND SCREEN
ABO/RH(D): AB POS
Antibody Screen: NEGATIVE
UNIT DIVISION: 0
Unit division: 0

## 2015-09-12 MED ORDER — POLYETHYLENE GLYCOL 3350 17 G PO PACK
17.0000 g | PACK | Freq: Two times a day (BID) | ORAL | Status: DC
  Administered 2015-09-12 (×2): 17 g via ORAL
  Filled 2015-09-12 (×2): qty 1

## 2015-09-12 MED ORDER — SENNOSIDES-DOCUSATE SODIUM 8.6-50 MG PO TABS
1.0000 | ORAL_TABLET | Freq: Two times a day (BID) | ORAL | Status: DC
  Administered 2015-09-12 (×2): 1 via ORAL
  Filled 2015-09-12 (×2): qty 1

## 2015-09-12 NOTE — Progress Notes (Addendum)
Output total 7a-4p = 240. Catheter left in place to monitor decreased output.  Barbee Shropshire. Brigitte Pulse, RN

## 2015-09-12 NOTE — Evaluation (Signed)
Physical Therapy Evaluation Patient Details Name: Mariah [name] MRN: [id number] DOB: 03-10-1919 Today's Date: 09/12/2015   History of Present Illness  Pt is a 81 year old female admitted with L femur fracture s/p IM nail 10/21 with post op hypotension and acute blood loss anemia.  PMHx CHF, afib, HTN, COPD  Clinical Impression  Patient is s/p above surgery resulting in functional limitations due to the deficits listed below (see PT Problem List).  Patient will benefit from skilled PT to increase their independence and safety with mobility to allow discharge to the venue listed below.   Pt assisted up to [facility] (for BM) and then over to recliner.  Pt reports mild pain with mobility and none at rest.  Pt remained on oxygen and SPO2 100% during session however pt with increased work of breathing with exertion and cued to take rest breaks between transfers.     Follow Up Recommendations SNF;Supervision/Assistance - 24 hour    Equipment Recommendations  None recommended by PT    Recommendations for Other Services       Precautions / Restrictions Precautions Precautions: Fall Restrictions Weight Bearing Restrictions: No LLE Weight Bearing: Weight bearing as tolerated      Mobility  Bed Mobility Overal bed mobility: Needs Assistance Bed Mobility: Supine to Sit     Supine to sit: Mod assist;HOB elevated     General bed mobility comments: assist for L LE and scooting hips to EOB utilizing bed pad  Transfers Overall transfer level: Needs assistance Equipment used: Rolling walker (2 wheeled) Transfers: Risk manager;Sit to/from Stand Sit to Stand: Min assist Stand pivot transfers: Min assist       General transfer comment: verbal cues for safety technique, pivoted to [facility] then 180* turn to recliner  Ambulation/Gait                Stairs            Wheelchair Mobility    Modified Rankin (Stroke Patients Only)       Balance Overall balance assessment:  History of Falls                                           Pertinent Vitals/Pain Pain Assessment: Faces Faces Pain Scale: Hurts little more Pain Location: L hip Pain Descriptors / Indicators: Aching;Sore Pain Intervention(s): Limited activity within patient's tolerance;Monitored during session;Repositioned    Home Living Family/patient expects to be discharged to:: Skilled nursing facility Living Arrangements: Children                    Prior Function Level of Independence: Independent with assistive device(s)         Comments: uses RW for mobility     Hand Dominance   Dominant Hand: Right    Extremity/Trunk Assessment               Lower Extremity Assessment: LLE deficits/detail   LLE Deficits / Details: some hip pain with movement, requires assist, reports no pain at rest     Communication   Communication: No difficulties  Cognition Arousal/Alertness: Awake/alert Behavior During Therapy: WFL for tasks assessed/performed Overall Cognitive Status: Within Functional Limits for tasks assessed                      General Comments      Exercises  Assessment/Plan    PT Assessment Patient needs continued PT services  PT Diagnosis Difficulty walking;Acute pain   PT Problem List Decreased strength;Decreased mobility;Decreased balance;Decreased knowledge of use of DME;Pain  PT Treatment Interventions DME instruction;Gait training;Functional mobility training;Patient/family education;Therapeutic activities;Therapeutic exercise   PT Goals (Current goals can be found in the Care Plan section) Acute Rehab PT Goals PT Goal Formulation: With patient Time For Goal Achievement: 09/19/15 Potential to Achieve Goals: Good    Frequency Min 3X/week   Barriers to discharge        Co-evaluation               End of Session Equipment Utilized During Treatment: Gait belt Activity Tolerance: Patient tolerated  treatment well Patient left: in chair;with call bell/phone within reach;with chair alarm set           Time: 1005-1027 PT Time Calculation (min) (ACUTE ONLY): 22 min   Charges:   PT Evaluation $Initial PT Evaluation Tier I: 1 Procedure     PT G Codes:        Blossom Crume,[name] 09/12/2015, 12:30 PM Carmelia Bake, PT, DPT 09/12/2015 Pager: [phone]

## 2015-09-12 NOTE — Care Management Important Message (Signed)
Important Message  Patient Details  Name: Mariah [name] MRN: [id number] Date of Birth: [birth year]   Medicare Important Message Given:  Yes-second notification given    Camillo Flaming 09/12/2015, 12:19 [place] Message  Patient Details  Name: Mariah [name] MRN: [id number] Date of Birth: [birth year]   Medicare Important Message Given:  Yes-second notification given    Camillo Flaming 09/12/2015, 12:19 PM

## 2015-09-12 NOTE — Progress Notes (Addendum)
[facility] PROGRESS NOTE  Mariah [name] YKZ:[id number] DOB: 08-22-1919 DOA: 09/08/2015 PCP: No primary care provider on file.   Narrative: Mariah [name] is a 81 y.o. female with PMH of COPD, P.Atrial fibrillation, not on anticoagulation, hypertension, Diastolic CHF who presented with left hip pain after fall. Found to have L hip fracture, and subsequently her hospital course was complicated by Hypotension, Poor Urine output, Afib with RVR, and acute blood loss anemia. Has been stabilized now, s/p 2 units PRBC, IVF and boluses. Discharge planning now  Assessment/Plan: Closed left hip fracture  -s/p ORIF 10/21 -started on ASA BID per Orthopedics for DVT prophylaxis -PT consult,  -Urine output finally improving, DC foley later today if urine output reasonable -SNF for rehab  Acute Blood loss anemia -monitor, Hb down from 11 to 6.8 on 10/23 -component of Hemodilution too -s/p 2 units PRBC 10/23, Hb 9 this am, monitor  Atrial Fibrillation with RVR -required multiple doses of IV metoprolol 2days ago, continue Toprol -CHA2DS2-VASc Score is 5, not on anticoagulation due high risk of fall.  -HR better -continue ASA   COPD: stable -prn albuterol nebulizer  CHF:  -likely diastolic, compensated,  Chest x-ray has no pulmonary edema. -hold lasix and spironolactone, no ECHo in system -resume diuretics when appropriate -stop IVF today  Hypertension: BP soft but not symptomatic -Metoprolol with holding parameters -BP has been on lower side and urine output poor, s/p IVF and Blood 10/23 -improving, stop IVF  UTI -had been on Ceftriaxone x1day then PO levaquin for 3days -stop Abx today, completed 5days today  Code Status: DNR Family Communication: none at bedside Disposition Plan: SNF when stable, 1-2days   Consultants:  Orthopedics  Procedures: PROCEDURE: Cephalomedullary fixation of left intertrochanteric femur fracture.  HPI/Subjective:  pain at Surg site, breathing  ok  Objective: Filed Vitals:   09/12/15 0453  BP: 103/66  Pulse: 86  Temp: 97.2 F (36.2 C)  Resp: 20    Intake/Output Summary (Last 24 hours) at 09/12/15 1154 Last data filed at 09/12/15 0300  Gross per 24 hour  Intake   2224 ml  Output    375 ml  Net   1849 ml   Filed Weights   09/10/15 0451 09/11/15 0500 09/12/15 0453  Weight: 53.1 kg (117 lb 1 oz) 58.1 kg (128 lb 1.4 oz) 58.2 kg (128 lb 4.9 oz)    Exam:   General:  AAOx3  Cardiovascular: S1S2/Irregular rate   Respiratory: CTAB  Abdomen: soft, NT, BS present  Musculoskeletal: L hip with incision c/d/i, 1plus edema  Data Reviewed: Basic Metabolic Panel:  Recent Labs Lab 09/08/15 2325 09/09/15 0506 09/10/15 0528 09/11/15 0513 09/12/15 0434  NA 139 142 139 138 138  K 3.6 3.9 3.9 4.2 4.2  CL 100* 100* 104 106 109  CO2 29 32 28 25 24   GLUCOSE 117* 136* 125* 118* 103*  BUN 23* 21* 20 24* 21*  CREATININE 0.88 0.81 0.98 1.06* 0.88  CALCIUM 9.5 9.3 8.1* 7.9* 8.1*   Liver Function Tests:  Recent Labs Lab 09/08/15 2325  AST 26  ALT 11*  ALKPHOS 56  BILITOT 1.3*  PROT 6.9  ALBUMIN 4.1   No results for input(s): LIPASE, AMYLASE in the last 168 hours. No results for input(s): AMMONIA in the last 168 hours. CBC:  Recent Labs Lab 09/08/15 2325 09/09/15 0506 09/10/15 0528 09/11/15 0513 09/12/15 0434  WBC 8.6 8.0 7.1 8.4 7.6  NEUTROABS 6.5  --   --   --   --  HGB 12.8 11.5* 8.0* 6.8* 9.0*  HCT 38.8 35.4* 23.8* 20.3* 26.7*  MCV 99.7 100.3* 100.0 101.5* 97.4  PLT 128* 123* 89* 88* 98*   Cardiac Enzymes: No results for input(s): CKTOTAL, CKMB, CKMBINDEX, TROPONINI in the last 168 hours. BNP (last 3 results)  Recent Labs  09/09/15 0506  BNP 370.4*    ProBNP (last 3 results) No results for input(s): PROBNP in the last 8760 hours.  CBG:  Recent Labs Lab 09/09/15 0732  GLUCAP 118*    No results found for this or any previous visit (from the past 240 hour(s)).   Studies: No  results found.  Scheduled Meds: . sodium chloride   Intravenous Once  . aspirin EC  325 mg Oral BID PC  . brimonidine  1 drop Both Eyes BID  . latanoprost  1 drop Both Eyes QHS  . metoprolol succinate  50 mg Oral Daily  . polyethylene glycol  17 g Oral BID  . senna-docusate  1 tablet Oral BID   Continuous Infusions:   Antibiotics Given (last 72 hours)    Date/Time Action Medication Dose Rate   09/09/15 1817 Given   clindamycin (CLEOCIN) IVPB 600 mg 600 mg 100 mL/hr   09/10/15 0044 Given   clindamycin (CLEOCIN) IVPB 600 mg 600 mg 100 mL/hr   09/10/15 9390 Given   Levofloxacin (LEVAQUIN) IVPB 250 mg 250 mg 50 mL/hr   09/11/15 0956 Given   levofloxacin (LEVAQUIN) tablet 250 mg 250 mg       Principal Problem:   Closed left hip fracture (HCC) Active Problems:   COPD (chronic obstructive pulmonary disease) (HCC)   Atrial fibrillation (HCC)   Hypertension   CHF (congestive heart failure) ([place])   Closed pertrochanteric fracture ([place])    Time spent: 35min    Kaytlyn Din  [facility] Pager [phone]. If 7PM-7AM, please contact night-coverage at [email or web address], password [facility] 09/12/2015, 11:54 AM  LOS: 3 days

## 2015-09-12 NOTE — Evaluation (Addendum)
Occupational Therapy Evaluation Patient Details Name: Mariah [name] MRN: [id number] DOB: 26-Feb-1919 Today's Date: 09/12/2015    History of Present Illness Pt is a 81 year old female admitted with L femur fracture s/p IM nail 10/21 with post op hypotension and acute blood loss anemia.  PMHx CHF, afib, HTN, COPD   Clinical Impression   Pt up from recliner using walker to [facility] and then back to bed. Pt stating she felt fatigued from sitting up in chair and also with more pain. Informed nursing of pt request for pain meds. Plan is for SNF. Will follow on acute to progress ADL independence for next venue. Pt is motivated.    Follow Up Recommendations  SNF;Supervision/Assistance - 24 hour    Equipment Recommendations  Other (comment) (TBA next venue)    Recommendations for Other Services       Precautions / Restrictions Precautions Precautions: Fall Restrictions Weight Bearing Restrictions: No LLE Weight Bearing: Weight bearing as tolerated      Mobility Bed Mobility Overal bed mobility: Needs Assistance Bed Mobility: Sit to Supine      Sit to supine: Max assist   General bed mobility comments: assist for LEs onto bed and to help lower trunk/shoulders down to the bed.   Transfers Overall transfer level: Needs assistance Equipment used: Rolling walker (2 wheeled) Transfers: Sit to/from Stand Sit to Stand: Min assist Stand pivot transfers: Mod assist;+2 safety/equipment       General transfer comment: cues for hand placement and LE management. Pivot from chair to [facility] and then pt took a fews backward to bed.     Balance Overall balance assessment: History of Falls                                          ADL Overall ADL's : Needs assistance/impaired Eating/Feeding: Independent;Sitting   Grooming: Wash/dry face;Set up;Sitting   Upper Body Bathing: Set up;Supervision/ safety;Sitting   Lower Body Bathing: Maximal assistance;Sit to/from stand    Upper Body Dressing : Minimal assistance;Sitting   Lower Body Dressing: Total assistance;Sit to/from stand   Toilet Transfer: +2 for safety/equipment;Moderate assistance;Stand-pivot;BSC;RW   Toileting- Clothing Manipulation and Hygiene: Total assistance;Sit to/from stand         General ADL Comments: Pt stating she was starting to get tired from being up in recliner and requesting to go back to bed. Pt also agreeable to do sponge bath in chair before returning to bed. Note pt with 2/4 dyspnea with activity so encouraged PLB with activity. Pt stating she needed to sit on BSC before return to bed and was able to have BM. Total assist to manage hygiene /gown. Informed nursing of pain level and nursing came to room for pain meds. Pt needs cues to fully back up to chair and bed before attempting to sit.      Vision     Perception     Praxis      Pertinent Vitals/Pain Pain Assessment: Faces Pain Score: 9  Faces Pain Scale: Hurts little more Pain Location: L hip Pain Descriptors / Indicators: Aching;Sore Pain Intervention(s): Limited activity within patient's tolerance;Monitored during session;Repositioned     Hand Dominance Right   Extremity/Trunk Assessment Upper Extremity Assessment Upper Extremity Assessment: Generalized weakness          Communication Communication Communication: No difficulties   Cognition Arousal/Alertness: Awake/alert Behavior During Therapy: WFL for tasks assessed/performed  Overall Cognitive Status: Within Functional Limits for tasks assessed                     General Comments       Exercises       Shoulder Instructions      Home Living Family/patient expects to be discharged to:: Skilled nursing facility Living Arrangements: Children                                      Prior Functioning/Environment Level of Independence: Independent with assistive device(s)        Comments: uses RW for mobility    OT  Diagnosis: Generalized weakness   OT Problem List: Decreased strength;Decreased knowledge of use of DME or AE;Decreased activity tolerance   OT Treatment/Interventions: Self-care/ADL training;Patient/family education;Therapeutic activities;DME and/or AE instruction    OT Goals(Current goals can be found in the care plan section) Acute Rehab OT Goals Patient Stated Goal: to return to more independence. OT Goal Formulation: With patient Time For Goal Achievement: 09/19/15 Potential to Achieve Goals: Good  OT Frequency: Min 2X/week   Barriers to D/C:            Co-evaluation              End of Session Equipment Utilized During Treatment: Gait belt;Rolling walker;Oxygen  Activity Tolerance: Patient limited by pain Patient left: in bed;with call bell/phone within reach;with bed alarm set   Time: 1116-1200 OT Time Calculation (min): 44 min Charges:  OT General Charges $OT Visit: 1 Procedure OT Evaluation $Initial OT Evaluation Tier I: 1 Procedure OT Treatments $Self Care/Home Management : 8-22 mins $Therapeutic Activity: 8-22 mins G-Codes:    Jules Schick  [phone] 09/12/2015, 12:51 PM

## 2015-09-12 NOTE — Progress Notes (Signed)
   Subjective:  Patient reports pain as mild to moderate.  Transfused PRBCs yesterday.  Objective:   VITALS:   Filed Vitals:   09/11/15 1727 09/11/15 2116 09/11/15 2239 09/12/15 0453  BP: 105/54 116/47 103/51 103/66  Pulse:  87 86 86  Temp: 97.3 F (36.3 C) 97.9 F (36.6 C) 97.7 F (36.5 C) 97.2 F (36.2 C)  TempSrc: Oral Oral Oral Oral  Resp: 20 20 20 20   Height:      Weight:    58.2 kg (128 lb 4.9 oz)  SpO2: 100% 99% 98% 98%    ABD soft Sensation intact distally Intact pulses distally Dorsiflexion/Plantar flexion intact Incision: dressing C/D/I Compartment soft Thigh soft  Lab Results  Component Value Date   WBC 8.4 09/11/2015   HGB 6.8* 09/11/2015   HCT 20.3* 09/11/2015   MCV 101.5* 09/11/2015   PLT 88* 09/11/2015   BMET    Component Value Date/Time   NA 138 09/11/2015 0513   K 4.2 09/11/2015 0513   CL 106 09/11/2015 0513   CO2 25 09/11/2015 0513   GLUCOSE 118* 09/11/2015 0513   BUN 24* 09/11/2015 0513   CREATININE 1.06* 09/11/2015 0513   CALCIUM 7.9* 09/11/2015 0513   GFRNONAA 44* 09/11/2015 0513   GFRAA 51* 09/11/2015 0513     Assessment/Plan: 3 Days Post-Op   Principal Problem:   Closed left hip fracture (HCC) Active Problems:   COPD (chronic obstructive pulmonary disease) (HCC)   Atrial fibrillation (HCC)   Hypertension   CHF (congestive heart failure) (HCC)   Closed pertrochanteric fracture (HCC)    WBAT with walker PO pain control DVT ppx: ASA, SCDs, TEDs PT/OT Am labs pending D/C planning    Aldwin Micalizzi, Ubah Radke 09/12/2015, 6:52 AM   Rod Can, MD Cell [phone]

## 2015-09-12 NOTE — Progress Notes (Signed)
SNF bed offers provided to family- [facility] has offered and they have selected this as their preference- she has been there before. Family quite pleased- will await MD for dc date.     Eduard Clos, MSW, [place]

## 2015-09-13 DIAGNOSIS — I340 Nonrheumatic mitral (valve) insufficiency: Secondary | ICD-10-CM | POA: Diagnosis not present

## 2015-09-13 DIAGNOSIS — M1990 Unspecified osteoarthritis, unspecified site: Secondary | ICD-10-CM | POA: Diagnosis not present

## 2015-09-13 DIAGNOSIS — I872 Venous insufficiency (chronic) (peripheral): Secondary | ICD-10-CM | POA: Diagnosis not present

## 2015-09-13 DIAGNOSIS — I480 Paroxysmal atrial fibrillation: Secondary | ICD-10-CM | POA: Diagnosis not present

## 2015-09-13 DIAGNOSIS — J441 Chronic obstructive pulmonary disease with (acute) exacerbation: Secondary | ICD-10-CM | POA: Diagnosis not present

## 2015-09-13 DIAGNOSIS — I5030 Unspecified diastolic (congestive) heart failure: Secondary | ICD-10-CM | POA: Diagnosis not present

## 2015-09-13 DIAGNOSIS — S32302D Unspecified fracture of left ilium, subsequent encounter for fracture with routine healing: Secondary | ICD-10-CM | POA: Diagnosis not present

## 2015-09-13 DIAGNOSIS — I87309 Chronic venous hypertension (idiopathic) without complications of unspecified lower extremity: Secondary | ICD-10-CM | POA: Diagnosis not present

## 2015-09-13 DIAGNOSIS — H3530 Unspecified macular degeneration: Secondary | ICD-10-CM | POA: Diagnosis not present

## 2015-09-13 DIAGNOSIS — I509 Heart failure, unspecified: Secondary | ICD-10-CM | POA: Diagnosis not present

## 2015-09-13 DIAGNOSIS — M6281 Muscle weakness (generalized): Secondary | ICD-10-CM | POA: Diagnosis not present

## 2015-09-13 DIAGNOSIS — R062 Wheezing: Secondary | ICD-10-CM | POA: Diagnosis not present

## 2015-09-13 DIAGNOSIS — Z9181 History of falling: Secondary | ICD-10-CM | POA: Diagnosis not present

## 2015-09-13 DIAGNOSIS — M25552 Pain in left hip: Secondary | ICD-10-CM | POA: Diagnosis not present

## 2015-09-13 DIAGNOSIS — I482 Chronic atrial fibrillation: Secondary | ICD-10-CM | POA: Diagnosis not present

## 2015-09-13 DIAGNOSIS — R2689 Other abnormalities of gait and mobility: Secondary | ICD-10-CM | POA: Diagnosis not present

## 2015-09-13 DIAGNOSIS — I10 Essential (primary) hypertension: Secondary | ICD-10-CM | POA: Diagnosis not present

## 2015-09-13 DIAGNOSIS — R0602 Shortness of breath: Secondary | ICD-10-CM | POA: Diagnosis not present

## 2015-09-13 DIAGNOSIS — I5033 Acute on chronic diastolic (congestive) heart failure: Secondary | ICD-10-CM | POA: Diagnosis not present

## 2015-09-13 DIAGNOSIS — D558 Other anemias due to enzyme disorders: Secondary | ICD-10-CM | POA: Diagnosis not present

## 2015-09-13 DIAGNOSIS — M79605 Pain in left leg: Secondary | ICD-10-CM | POA: Diagnosis not present

## 2015-09-13 DIAGNOSIS — S72102D Unspecified trochanteric fracture of left femur, subsequent encounter for closed fracture with routine healing: Secondary | ICD-10-CM | POA: Diagnosis not present

## 2015-09-13 DIAGNOSIS — M21252 Flexion deformity, left hip: Secondary | ICD-10-CM | POA: Diagnosis not present

## 2015-09-13 DIAGNOSIS — J309 Allergic rhinitis, unspecified: Secondary | ICD-10-CM | POA: Diagnosis not present

## 2015-09-13 DIAGNOSIS — M79672 Pain in left foot: Secondary | ICD-10-CM | POA: Diagnosis not present

## 2015-09-13 DIAGNOSIS — J449 Chronic obstructive pulmonary disease, unspecified: Secondary | ICD-10-CM | POA: Diagnosis not present

## 2015-09-13 DIAGNOSIS — H409 Unspecified glaucoma: Secondary | ICD-10-CM | POA: Diagnosis not present

## 2015-09-13 DIAGNOSIS — M722 Plantar fascial fibromatosis: Secondary | ICD-10-CM | POA: Diagnosis not present

## 2015-09-13 DIAGNOSIS — S72142D Displaced intertrochanteric fracture of left femur, subsequent encounter for closed fracture with routine healing: Secondary | ICD-10-CM | POA: Diagnosis not present

## 2015-09-13 LAB — BASIC METABOLIC PANEL
ANION GAP: 7 (ref 5–15)
BUN: 16 mg/dL (ref 6–20)
CALCIUM: 8.7 mg/dL — AB (ref 8.9–10.3)
CO2: 24 mmol/L (ref 22–32)
Chloride: 107 mmol/L (ref 101–111)
Creatinine, Ser: 0.66 mg/dL (ref 0.44–1.00)
GFR calc Af Amer: >60 mL/min (ref >60)
GFR calc non Af Amer: >60 mL/min (ref >60)
GLUCOSE: 101 mg/dL — AB (ref 65–99)
Potassium: 4.3 mmol/L (ref 3.5–5.1)
Sodium: 138 mmol/L (ref 135–145)

## 2015-09-13 LAB — CBC
HEMATOCRIT: 28.7 % — AB (ref 36.0–46.0)
Hemoglobin: 9.4 g/dL — ABNORMAL LOW (ref 12.0–15.0)
MCH: 32.5 pg (ref 26.0–34.0)
MCHC: 32.8 g/dL (ref 30.0–36.0)
MCV: 99.3 fL (ref 78.0–100.0)
Platelets: 137 10*3/uL — ABNORMAL LOW (ref 150–400)
RBC: 2.89 MIL/uL — ABNORMAL LOW (ref 3.87–5.11)
RDW: 20.1 % — AB (ref 11.5–15.5)
WBC: 9 10*3/uL (ref 4.0–10.5)

## 2015-09-13 MED ORDER — HYDROCODONE-ACETAMINOPHEN 5-325 MG PO TABS: 1.0000 | ORAL_TABLET | Freq: Four times a day (QID) | ORAL | Status: DC | PRN

## 2015-09-13 MED ORDER — TRAMADOL HCL 50 MG PO TABS: 50.0000 mg | ORAL_TABLET | Freq: Four times a day (QID) | ORAL | Status: DC | PRN

## 2015-09-13 NOTE — Progress Notes (Signed)
Foley catheter removed. Pt assisted to bedside commode and bright red blood and clots seen. MD notified.

## 2015-09-13 NOTE — Discharge Summary (Signed)
Physician Discharge Summary  Mariah [name] XTG:[id number] DOB: [birth year] DOA: 09/08/2015  PCP: No primary care provider on file.  Admit date: 09/08/2015 Discharge date: 09/13/2015  Time spent: 45 minutes  Recommendations for Outpatient Follow-up:  PCP in1 week Dr.Swinteck, orthpedics in 2 weeks WBAT L leg  Discharge Diagnoses:  Principal Problem:  Closed left hip fracture ([place]) Active Problems:  COPD (chronic obstructive pulmonary disease) (H[facility])  Atrial fibrillation (H[facility])  Hypertension  CHF (congestive heart failure) ([place])  Closed pertrochanteric fracture (H[facility])  Afib with RVR  Hypotension  Discharge Condition:stable  Diet recommendation:low sodium  Filed Weights   09/11/15 0500 09/12/15 0453 09/13/15 0532  Weight: 58.1 kg (128 lb 1.4 oz) 58.2 kg (128 lb 4.9 oz) 57.8 kg (127 lb 6.8 oz)    History of present illness:  Chief Complaint: Left hip pain after fall. HPI: Mariah [name] is a 81 y.o. female with PMH of hypertension, COPD, atrial fibrillation (not on anticoagulants due to high-risk of fall), CHF presented with left hip pain after fall, found to have Hip fracture  Hospital Course:  Mariah [name] is a 80 y.o. female with PMH of COPD, P.Atrial fibrillation, not on anticoagulation, hypertension, Diastolic CHF who presented with left hip pain after fall. Found to have L hip fracture, and subsequently her hospital course was complicated by Hypotension, Poor Urine output, Afib with RVR, and acute blood loss anemia. Has been stabilized now, s/p 2 units PRBC, IVF and boluses. Discharge planning   Detailed course: Closed left hip fracture  -s/p ORIF 10/21 -started on ASA BID per Orthopedics for DVT prophylaxis -PT consulted, Rehab recommended -WBAT recommended per Ortho -foley removed  Acute Blood loss anemia -Hb dropped from 11 to 6.8 on 10/23 -component of Hemodilution too -s/p 2 units PRBC 10/23,  -Hb stable since then for 48hours, Hb 9 at  discharge  Atrial Fibrillation with RVR -required multiple doses of IV metoprolol 3 days ago, now improved, continue Toprol -CHA2DS2-VASc Score is 5, not on anticoagulation due high risk of fall, advanced age etc -HR better -continue ASA   COPD: stable -prn albuterol nebulizer  CHF:  -likely diastolic, compensated, Chest x-ray has no pulmonary edema. -held lasix and spironolactone on admission and post op due to hypotension, no ECHo in system -resumed Po lasix today  Hypertension: -BP was low with urine output poor-post op -s/p IVF and Blood 10/23, now improved, metoprolol resumed  UTI -was Rx for Ceftriaxone x1day then PO levaquin for 3days -completed 5days   Code Status: DNR  Procedures: PROCEDURE PERFORMED: Cephalomedullary fixation of left pertrochanteric femur fracture.  Consultations: Orthopedics Dr. Lyla Glassing  Discharge Exam: Filed Vitals:   09/13/15 0532  BP: 101/50  Pulse: 86  Temp: 97.3 F (36.3 C)  Resp: 18    General: AAOx3 Cardiovascular: S1S2/RRR Respiratory: CTAB  Discharge Instructions   Discharge Instructions    Diet - low sodium heart healthy  Complete by: As directed      Increase activity slowly  Complete by: As directed           Current Discharge Medication List    START taking these medications   Details  HYDROcodone-acetaminophen (NORCO/VICODIN) 5-325 MG tablet Take 1 tablet by mouth every 6 (six) hours as needed for severe pain. Qty: 30 tablet, Refills: 0      CONTINUE these medications which have NOT CHANGED   Details  acetaminophen (TYLENOL) 500 MG tablet Take 1,000 mg by mouth 3 (three) times daily.    albuterol (PROVENTIL) (2.5 MG/3ML) 0.083%  nebulizer solution Take 2.5 mg by nebulization every 6 (six) hours as needed for wheezing or shortness of breath.    aspirin EC 81 MG tablet Take 81 mg by mouth daily.    brimonidine (ALPHAGAN P) 0.1 % SOLN Place 1 drop  into both eyes 2 (two) times daily.    carboxymethylcellulose (REFRESH PLUS) 0.5 % SOLN Place 2 drops into both eyes daily as needed (dry eyes).    furosemide (LASIX) 40 MG tablet Take 40 mg by mouth daily.    latanoprost (XALATAN) 0.005 % ophthalmic solution Place 1 drop into both eyes at bedtime.    Melatonin 1 MG TABS Take 1 tablet by mouth daily.    metoprolol succinate (TOPROL-XL) 50 MG 24 hr tablet Take 50 mg by mouth daily. Take with or immediately following a meal.    mirtazapine (REMERON) 15 MG tablet Take 15 mg by mouth at bedtime as needed (sleep).    PRESCRIPTION MEDICATION Apply 1 application topically 2 (two) times daily as needed (pain).    traMADol (ULTRAM) 50 MG tablet Take 50 mg by mouth every 6 (six) hours as needed for moderate pain.      STOP taking these medications     spironolactone (ALDACTONE) 25 MG tablet        Allergies  Allergen Reactions  . Lyrica [Pregabalin] Other (See Comments)    Caused her to be lethargic and her legs felt as if she could not stand  . Penicillins Rash   Follow-up Information    Follow up with Swinteck, Horald Pollen, MD. Schedule an appointment as soon as possible for a visit in 2 weeks.   Specialty: Orthopedic Surgery   Why: For wound re-check   Contact information:   [address] [place] [address] [phone]         The results of significant diagnostics from this hospitalization (including imaging, microbiology, ancillary and laboratory) are listed below for reference.    Significant Diagnostic Studies:  Imaging Results    Dg Chest 2 View  09/08/2015 CLINICAL DATA: Hip fracture EXAM: CHEST - 2 VIEW COMPARISON: None. FINDINGS: Cardiac shadow is enlarged. The thoracic aorta is tortuous with calcification. No aneurysmal dilatation is seen. Blunting of left costophrenic angle is noted which may represent a small effusion but may be  chronic in nature. No acute bony abnormality is seen. IMPRESSION: Blunting of left costophrenic angle. Cardiomegaly. Electronically Signed By: Inez Catalina M.D. On: 09/08/2015 23:52   Dg Knee 1-2 Views Left  09/09/2015 CLINICAL DATA: 81 year old female with left hip fracture after falling yesterday. Pain radiates toward the knee. EXAM: LEFT KNEE - 1-2 VIEW COMPARISON: None. FINDINGS: No evidence of acute fracture, malalignment or joint effusion. There is narrowing of the medial compartmental joint space consistent with osteoarthritis. Atherosclerotic calcifications noted in the superficial femoral artery and runoff vessels. IMPRESSION: 1. No acute fracture, malalignment or joint effusion. 2. Medial compartmental osteoarthritis. 3. Atherosclerotic vascular calcifications. Electronically Signed By: Jacqulynn Cadet M.D. On: 09/09/2015 07:39   Ct Head Wo Contrast  09/08/2015 CLINICAL DATA: Fall today with blunt head trauma EXAM: CT HEAD WITHOUT CONTRAST TECHNIQUE: Contiguous axial images were obtained from the base of the skull through the vertex without intravenous contrast. COMPARISON: None. FINDINGS: Bony calvarium is intact. No gross soft tissue abnormality is noted. Mild atrophic changes are seen. No findings to suggest acute hemorrhage, acute infarction or space-occupying mass lesion are noted. Changes of chronic white matter ischemic change are noted as well. IMPRESSION: Mild  chronic atrophic and ischemic changes without acute abnormality. Electronically Signed By: Inez Catalina M.D. On: 09/08/2015 23:37   Pelvis Portable  09/09/2015 CLINICAL DATA: STAT READ. [place]PORT [place] [id number]. Status post op left femoral IM rod. Imaging done in [facility] PACU. EXAM: PORTABLE PELVIS 1-2 VIEWS COMPARISON: None. FINDINGS: Interval placement of a left intra medullary nail interlocking cannulated femoral neck screw transfixing a comminuted intertrochanteric fracture. There is medial displacement of the  lesser trochanteric fracture fragment. There is 8 mm of lateral displacement of the greater trochanteric fragment relative to the subtrochanteric femur. There is 8 mm gap at the medial femoral neck and intertrochanteric region. There is no right hip fracture or dislocation. There is generalized osteopenia. There degenerative changes of the lower lumbar spine. There is a calcified pelvic mass most consistent with a uterine fibroid. IMPRESSION: Interval ORIF left intertrochanteric fracture as detailed above. Electronically Signed By: Kathreen Devoid On: 09/09/2015 14:44   Dg C-arm 61-120 Min-no Report  09/09/2015 CLINICAL DATA: surgery C-ARM 61-120 MINUTES Fluoroscopy was utilized by the requesting physician. No radiographic interpretation.   Dg Hip Unilat With Pelvis 2-3 Views Left  09/08/2015 CLINICAL DATA: Fall today with left hip pain, initial encounter EXAM: DG HIP (WITH OR WITHOUT PELVIS) 2-3V LEFT COMPARISON: None. FINDINGS: There is a comminuted fracture through the intratrochanteric region with impaction and angulation at the fracture site. The pelvic ring is intact. Calcified uterine fibroid is noted. IMPRESSION: Comminuted fracture of the proximal left femur. Electronically Signed By: Inez Catalina M.D. On: 09/08/2015 23:53   Dg Femur Min 2 Views Left  09/09/2015 CLINICAL DATA: ORIF left hip fracture EXAM: LEFT FEMUR 2 VIEWS COMPARISON: None. FINDINGS: Interval placement of a left intra medullary nail interlocking cannulated femoral neck screw transfixing a comminuted intertrochanteric fracture. There is medial displacement of the lesser trochanteric fracture fragment. There is 8 mm of lateral displacement of the greater trochanteric fragment relative to the subtrochanteric femur. There is 8 mm gap at the medial femoral neck and intertrochanteric region. There are postsurgical changes in the surrounding soft tissues. There is medial femorotibial compartment joint space narrowing and  marginal osteophytosis. There is a calcified pelvic mass most consistent with a uterine fibroid. IMPRESSION: Interval ORIF left intertrochanteric fracture as detailed above. Electronically Signed By: Kathreen Devoid On: 09/09/2015 14:46   Dg Femur Min 2 Views Left  09/09/2015 CLINICAL DATA: Intertrochanteric hip fracture fixation EXAM: DG C-ARM 61-120 MIN-NO REPORT; LEFT FEMUR 2 VIEWS COMPARISON: 09/08/2015 FINDINGS: There is a long gamma nail, a proximal dynamic hip screw and a single distal interlocking screw transfixing the intertrochanteric fracture with near anatomic reduction. IMPRESSION: Internal fixation of left hip fracture with anatomic reduction and no complicating features. Electronically Signed By: Marijo Sanes M.D. On: 09/09/2015 13:27     Microbiology: No results found for this or any previous visit (from the past 240 hour(s)).   Labs: Basic Metabolic Panel:  Last Labs      Recent Labs Lab 09/09/15 0506 09/10/15 0528 09/11/15 0513 09/12/15 0434 09/13/15 0448  NA 142 139 138 138 138  K 3.9 3.9 4.2 4.2 4.3  CL 100* 104 106 109 107  CO2 32 28 25 24 24   GLUCOSE 136* 125* 118* 103* 101*  BUN 21* 20 24* 21* 16  CREATININE 0.81 0.98 1.06* 0.88 0.66  CALCIUM 9.3 8.1* 7.9* 8.1* 8.7*     Liver Function Tests:  Last Labs      Recent Labs Lab 09/08/15 2325  AST 26  ALT 11*  ALKPHOS 56  BILITOT 1.3*  PROT 6.9  ALBUMIN 4.1      Last Labs     No results for input(s): LIPASE, AMYLASE in the last 168 hours.    Last Labs     No results for input(s): AMMONIA in the last 168 hours.   CBC:  Last Labs      Recent Labs Lab 09/08/15 2325 09/09/15 0506 09/10/15 0528 09/11/15 0513 09/12/15 0434 09/13/15 0448  WBC 8.6 8.0 7.1 8.4 7.6 9.0  NEUTROABS 6.5 --  --  --  --  --   HGB 12.8 11.5* 8.0* 6.8* 9.0* 9.4*  HCT 38.8 35.4* 23.8* 20.3* 26.7* 28.7*   MCV 99.7 100.3* 100.0 101.5* 97.4 99.3  PLT 128* 123* 89* 88* 98* 137*     Cardiac Enzymes:  Last Labs     No results for input(s): CKTOTAL, CKMB, CKMBINDEX, TROPONINI in the last 168 hours.   BNP: BNP (last 3 results)  Recent Labs (within last 365 days)     Recent Labs  09/09/15 0506  BNP 370.4*      ProBNP (last 3 results)  Recent Labs (within last 365 days)    No results for input(s): PROBNP in the last 8760 hours.    CBG:  Last Labs      Recent Labs Lab 09/09/15 0732  GLUCAP 118*         Signed:  Iolanda Folson [facility] 09/13/2015, 10:17 AM

## 2015-09-13 NOTE — Progress Notes (Signed)
Patient for d/c today to SNF bed at [facility]. Daughter and patient agreeable to this plan- plan transfer via EMS. Eduard Clos, MSW, [place]

## 2015-09-13 NOTE — H&P (Deleted)
Physician Discharge Summary  Mariah [name] HGD:[id number] DOB: December 23, 1918 DOA: 09/08/2015  PCP: No primary care provider on file.  Admit date: 09/08/2015 Discharge date: 09/13/2015  Time spent: 45 minutes  Recommendations for Outpatient Follow-up:  PCP in1 week Dr.Swinteck, orthpedics in 2 weeks WBAT L leg  Discharge Diagnoses:  Principal Problem:   Closed left hip fracture ([place]) Active Problems:   COPD (chronic obstructive pulmonary disease) ([facility])   Atrial fibrillation (H[facility])   Hypertension   CHF (congestive heart failure) ([place])   Closed pertrochanteric fracture ([facility])   Afib with RVR   Hypotension  Discharge Condition:stable  Diet recommendation:low sodium  Filed Weights   09/11/15 0500 09/12/15 0453 09/13/15 0532  Weight: 58.1 kg (128 lb 1.4 oz) 58.2 kg (128 lb 4.9 oz) 57.8 kg (127 lb 6.8 oz)    History of present illness:  Chief Complaint: Left hip pain after fall. HPI: Mariah [name] is a 81 y.o. female with PMH of hypertension, COPD, atrial fibrillation (not on anticoagulants due to high-risk of fall), CHF presented with left hip pain after fall, found to have Hip fracture  Hospital Course:  Mariah [name] is a 81 y.o. female with PMH of COPD, P.Atrial fibrillation, not on anticoagulation, hypertension, Diastolic CHF who presented with left hip pain after fall. Found to have L hip fracture, and subsequently her hospital course was complicated by Hypotension, Poor Urine output, Afib with RVR, and acute blood loss anemia. Has been stabilized now, s/p 2 units PRBC, IVF and boluses. Discharge planning   Detailed course: Closed left hip fracture  -s/p ORIF 10/21 -started on ASA BID per Orthopedics for DVT prophylaxis -PT consulted, Rehab recommended -WBAT recommended per Ortho -foley removed  Acute Blood loss anemia -Hb dropped from 11 to 6.8 on 10/23 -component of Hemodilution too -s/p 2 units PRBC 10/23,  -Hb stable since then for 48hours, Hb 9 at discharge  Atrial  Fibrillation with RVR -required multiple doses of IV metoprolol 3 days ago, now improved, continue Toprol -CHA2DS2-VASc Score is 5, not on anticoagulation due high risk of fall, advanced age etc -HR better -continue ASA   COPD: stable -prn albuterol nebulizer  CHF:  -likely diastolic, compensated, Chest x-ray has no pulmonary edema. -held lasix and spironolactone on admission and post op due to hypotension, no ECHo in system -resumed Po lasix today  Hypertension: -BP was low with urine output poor-post op -s/p IVF and Blood 10/23, now improved, metoprolol resumed  UTI -was Rx for Ceftriaxone x1day then PO levaquin for 3days -completed 5days   Code Status: DNR  Procedures: PROCEDURE PERFORMED: Cephalomedullary fixation of left pertrochanteric femur fracture.  Consultations: Orthopedics Dr. Lyla Glassing  Discharge Exam: Filed Vitals:   09/13/15 0532  BP: 101/50  Pulse: 86  Temp: 97.3 F (36.3 C)  Resp: 18    General: AAOx3 Cardiovascular: S1S2/RRR Respiratory: CTAB  Discharge Instructions   Discharge Instructions    Diet - low sodium heart healthy    Complete by:  As directed      Increase activity slowly    Complete by:  As directed           Current Discharge Medication List    START taking these medications   Details  HYDROcodone-acetaminophen (NORCO/VICODIN) 5-325 MG tablet Take 1 tablet by mouth every 6 (six) hours as needed for severe pain. Qty: 30 tablet, Refills: 0      CONTINUE these medications which have NOT CHANGED   Details  acetaminophen (TYLENOL) 500 MG tablet Take 1,000 mg  by mouth 3 (three) times daily.    albuterol (PROVENTIL) (2.5 MG/3ML) 0.083% nebulizer solution Take 2.5 mg by nebulization every 6 (six) hours as needed for wheezing or shortness of breath.    aspirin EC 81 MG tablet Take 81 mg by mouth daily.    brimonidine (ALPHAGAN P) 0.1 % SOLN Place 1 drop into both eyes 2 (two) times daily.    carboxymethylcellulose  (REFRESH PLUS) 0.5 % SOLN Place 2 drops into both eyes daily as needed (dry eyes).    furosemide (LASIX) 40 MG tablet Take 40 mg by mouth daily.    latanoprost (XALATAN) 0.005 % ophthalmic solution Place 1 drop into both eyes at bedtime.    Melatonin 1 MG TABS Take 1 tablet by mouth daily.    metoprolol succinate (TOPROL-XL) 50 MG 24 hr tablet Take 50 mg by mouth daily. Take with or immediately following a meal.    mirtazapine (REMERON) 15 MG tablet Take 15 mg by mouth at bedtime as needed (sleep).    PRESCRIPTION MEDICATION Apply 1 application topically 2 (two) times daily as needed (pain).    traMADol (ULTRAM) 50 MG tablet Take 50 mg by mouth every 6 (six) hours as needed for moderate pain.      STOP taking these medications     spironolactone (ALDACTONE) 25 MG tablet        Allergies  Allergen Reactions  . Lyrica [Pregabalin] Other (See Comments)    Caused her to be lethargic and her legs felt as if she could not stand  . Penicillins Rash   Follow-up Information    Follow up with Swinteck, Horald Pollen, MD. Schedule an appointment as soon as possible for a visit in 2 weeks.   Specialty:  Orthopedic Surgery   Why:  For wound re-check   Contact information:   [address] [place] [address] [phone]        The results of significant diagnostics from this hospitalization (including imaging, microbiology, ancillary and laboratory) are listed below for reference.    Significant Diagnostic Studies: Dg Chest 2 View  09/08/2015  CLINICAL DATA:  Hip fracture EXAM: CHEST - 2 VIEW COMPARISON:  None. FINDINGS: Cardiac shadow is enlarged. The thoracic aorta is tortuous with calcification. No aneurysmal dilatation is seen. Blunting of left costophrenic angle is noted which may represent a small effusion but may be chronic in nature. No acute bony abnormality is seen. IMPRESSION: Blunting of left costophrenic angle. Cardiomegaly. Electronically Signed   By:  Inez Catalina M.D.   On: 09/08/2015 23:52   Dg Knee 1-2 Views Left  09/09/2015  CLINICAL DATA:  81 year old female with left hip fracture after falling yesterday. Pain radiates toward the knee. EXAM: LEFT KNEE - 1-2 VIEW COMPARISON:  None. FINDINGS: No evidence of acute fracture, malalignment or joint effusion. There is narrowing of the medial compartmental joint space consistent with osteoarthritis. Atherosclerotic calcifications noted in the superficial femoral artery and runoff vessels. IMPRESSION: 1. No acute fracture, malalignment or joint effusion. 2. Medial compartmental osteoarthritis. 3. Atherosclerotic vascular calcifications. Electronically Signed   By: Jacqulynn Cadet M.D.   On: 09/09/2015 07:39   Ct Head Wo Contrast  09/08/2015  CLINICAL DATA:  Fall today with blunt head trauma EXAM: CT HEAD WITHOUT CONTRAST TECHNIQUE: Contiguous axial images were obtained from the base of the skull through the vertex without intravenous contrast. COMPARISON:  None. FINDINGS: Bony calvarium is intact. No gross soft tissue abnormality is noted. Mild atrophic changes are  seen. No findings to suggest acute hemorrhage, acute infarction or space-occupying mass lesion are noted. Changes of chronic white matter ischemic change are noted as well. IMPRESSION: Mild chronic atrophic and ischemic changes without acute abnormality. Electronically Signed   By: Inez Catalina M.D.   On: 09/08/2015 23:37   Pelvis Portable  09/09/2015  CLINICAL DATA:  STAT READ. CALL REPORT TO [id number]. Status post op left femoral IM rod. Imaging done in [facility] PACU. EXAM: PORTABLE PELVIS 1-2 VIEWS COMPARISON:  None. FINDINGS: Interval placement of a left intra medullary nail interlocking cannulated femoral neck screw transfixing a comminuted intertrochanteric fracture. There is medial displacement of the lesser trochanteric fracture fragment. There is 8 mm of lateral displacement of the greater trochanteric fragment relative to the subtrochanteric  femur. There is 8 mm gap at the medial femoral neck and intertrochanteric region. There is no right hip fracture or dislocation. There is generalized osteopenia. There degenerative changes of the lower lumbar spine. There is a calcified pelvic mass most consistent with a uterine fibroid. IMPRESSION: Interval ORIF left intertrochanteric fracture as detailed above. Electronically Signed   By: Kathreen Devoid   On: 09/09/2015 14:44   Dg C-arm 61-120 Min-no Report  09/09/2015  CLINICAL DATA: surgery C-ARM 61-120 MINUTES Fluoroscopy was utilized by the requesting physician.  No radiographic interpretation.   Dg Hip Unilat With Pelvis 2-3 Views Left  09/08/2015  CLINICAL DATA:  Fall today with left hip pain, initial encounter EXAM: DG HIP (WITH OR WITHOUT PELVIS) 2-3V LEFT COMPARISON:  None. FINDINGS: There is a comminuted fracture through the intratrochanteric region with impaction and angulation at the fracture site. The pelvic ring is intact. Calcified uterine fibroid is noted. IMPRESSION: Comminuted fracture of the proximal left femur. Electronically Signed   By: Inez Catalina M.D.   On: 09/08/2015 23:53   Dg Femur Min 2 Views Left  09/09/2015  CLINICAL DATA:  ORIF left hip fracture EXAM: LEFT FEMUR 2 VIEWS COMPARISON:  None. FINDINGS: Interval placement of a left intra medullary nail interlocking cannulated femoral neck screw transfixing a comminuted intertrochanteric fracture. There is medial displacement of the lesser trochanteric fracture fragment. There is 8 mm of lateral displacement of the greater trochanteric fragment relative to the subtrochanteric femur. There is 8 mm gap at the medial femoral neck and intertrochanteric region. There are postsurgical changes in the surrounding soft tissues. There is medial femorotibial compartment joint space narrowing and marginal osteophytosis. There is a calcified pelvic mass most consistent with a uterine fibroid. IMPRESSION: Interval ORIF left intertrochanteric  fracture as detailed above. Electronically Signed   By: Kathreen Devoid   On: 09/09/2015 14:46   Dg Femur Min 2 Views Left  09/09/2015  CLINICAL DATA:  Intertrochanteric hip fracture fixation EXAM: DG C-ARM 61-120 MIN-NO REPORT; LEFT FEMUR 2 VIEWS COMPARISON:  09/08/2015 FINDINGS: There is a long gamma nail, a proximal dynamic hip screw and a single distal interlocking screw transfixing the intertrochanteric fracture with near anatomic reduction. IMPRESSION: Internal fixation of left hip fracture with anatomic reduction and no complicating features. Electronically Signed   By: Marijo Sanes M.D.   On: 09/09/2015 13:27    Microbiology: No results found for this or any previous visit (from the past 240 hour(s)).   Labs: Basic Metabolic Panel:  Recent Labs Lab 09/09/15 0506 09/10/15 0528 09/11/15 0513 09/12/15 0434 09/13/15 0448  NA 142 139 138 138 138  K 3.9 3.9 4.2 4.2 4.3  CL 100* 104 106 109 107  CO2 32  28 25 24 24   GLUCOSE 136* 125* 118* 103* 101*  BUN 21* 20 24* 21* 16  CREATININE 0.81 0.98 1.06* 0.88 0.66  CALCIUM 9.3 8.1* 7.9* 8.1* 8.7*   Liver Function Tests:  Recent Labs Lab 09/08/15 2325  AST 26  ALT 11*  ALKPHOS 56  BILITOT 1.3*  PROT 6.9  ALBUMIN 4.1   No results for input(s): LIPASE, AMYLASE in the last 168 hours. No results for input(s): AMMONIA in the last 168 hours. CBC:  Recent Labs Lab 09/08/15 2325 09/09/15 0506 09/10/15 0528 09/11/15 0513 09/12/15 0434 09/13/15 0448  WBC 8.6 8.0 7.1 8.4 7.6 9.0  NEUTROABS 6.5  --   --   --   --   --   HGB 12.8 11.5* 8.0* 6.8* 9.0* 9.4*  HCT 38.8 35.4* 23.8* 20.3* 26.7* 28.7*  MCV 99.7 100.3* 100.0 101.5* 97.4 99.3  PLT 128* 123* 89* 88* 98* 137*   Cardiac Enzymes: No results for input(s): CKTOTAL, CKMB, CKMBINDEX, TROPONINI in the last 168 hours. BNP: BNP (last 3 results)  Recent Labs  09/09/15 0506  BNP 370.4*    ProBNP (last 3 results) No results for input(s): PROBNP in the last 8760  hours.  CBG:  Recent Labs Lab 09/09/15 0732  GLUCAP 118*       Signed:  Felicity Penix  [facility] 09/13/2015, 10:17 AM

## 2015-09-14 DIAGNOSIS — M25552 Pain in left hip: Secondary | ICD-10-CM | POA: Diagnosis not present

## 2015-09-14 DIAGNOSIS — R2689 Other abnormalities of gait and mobility: Secondary | ICD-10-CM | POA: Diagnosis not present

## 2015-09-14 DIAGNOSIS — R062 Wheezing: Secondary | ICD-10-CM | POA: Diagnosis not present

## 2015-09-16 DIAGNOSIS — S32302D Unspecified fracture of left ilium, subsequent encounter for fracture with routine healing: Secondary | ICD-10-CM | POA: Diagnosis not present

## 2015-09-16 DIAGNOSIS — J449 Chronic obstructive pulmonary disease, unspecified: Secondary | ICD-10-CM | POA: Diagnosis not present

## 2015-09-16 DIAGNOSIS — R2689 Other abnormalities of gait and mobility: Secondary | ICD-10-CM | POA: Diagnosis not present

## 2015-09-16 DIAGNOSIS — I5030 Unspecified diastolic (congestive) heart failure: Secondary | ICD-10-CM | POA: Diagnosis not present

## 2015-09-16 DIAGNOSIS — M6281 Muscle weakness (generalized): Secondary | ICD-10-CM | POA: Diagnosis not present

## 2015-09-16 DIAGNOSIS — M25552 Pain in left hip: Secondary | ICD-10-CM | POA: Diagnosis not present

## 2015-09-16 DIAGNOSIS — I482 Chronic atrial fibrillation: Secondary | ICD-10-CM | POA: Diagnosis not present

## 2015-09-23 DIAGNOSIS — M79605 Pain in left leg: Secondary | ICD-10-CM | POA: Diagnosis not present

## 2015-09-23 DIAGNOSIS — M79672 Pain in left foot: Secondary | ICD-10-CM | POA: Diagnosis not present

## 2015-09-26 DIAGNOSIS — R2689 Other abnormalities of gait and mobility: Secondary | ICD-10-CM | POA: Diagnosis not present

## 2015-09-26 DIAGNOSIS — D558 Other anemias due to enzyme disorders: Secondary | ICD-10-CM | POA: Diagnosis not present

## 2015-09-26 DIAGNOSIS — M79605 Pain in left leg: Secondary | ICD-10-CM | POA: Diagnosis not present

## 2015-09-26 DIAGNOSIS — S72142D Displaced intertrochanteric fracture of left femur, subsequent encounter for closed fracture with routine healing: Secondary | ICD-10-CM | POA: Diagnosis not present

## 2015-09-28 DIAGNOSIS — M79605 Pain in left leg: Secondary | ICD-10-CM | POA: Diagnosis not present

## 2015-09-28 DIAGNOSIS — R2689 Other abnormalities of gait and mobility: Secondary | ICD-10-CM | POA: Diagnosis not present

## 2015-09-29 DIAGNOSIS — R2689 Other abnormalities of gait and mobility: Secondary | ICD-10-CM | POA: Diagnosis not present

## 2015-09-29 DIAGNOSIS — M79605 Pain in left leg: Secondary | ICD-10-CM | POA: Diagnosis not present

## 2015-10-04 DIAGNOSIS — I509 Heart failure, unspecified: Secondary | ICD-10-CM | POA: Diagnosis not present

## 2015-10-04 DIAGNOSIS — S32302D Unspecified fracture of left ilium, subsequent encounter for fracture with routine healing: Secondary | ICD-10-CM | POA: Diagnosis not present

## 2015-10-04 DIAGNOSIS — I480 Paroxysmal atrial fibrillation: Secondary | ICD-10-CM | POA: Diagnosis not present

## 2015-10-04 DIAGNOSIS — J441 Chronic obstructive pulmonary disease with (acute) exacerbation: Secondary | ICD-10-CM | POA: Diagnosis not present

## 2015-10-05 DIAGNOSIS — M79605 Pain in left leg: Secondary | ICD-10-CM | POA: Diagnosis not present

## 2015-10-05 DIAGNOSIS — R2689 Other abnormalities of gait and mobility: Secondary | ICD-10-CM | POA: Diagnosis not present

## 2015-10-12 DIAGNOSIS — I5033 Acute on chronic diastolic (congestive) heart failure: Secondary | ICD-10-CM | POA: Diagnosis not present

## 2015-10-12 DIAGNOSIS — I482 Chronic atrial fibrillation: Secondary | ICD-10-CM | POA: Diagnosis not present

## 2015-10-12 DIAGNOSIS — S32302D Unspecified fracture of left ilium, subsequent encounter for fracture with routine healing: Secondary | ICD-10-CM | POA: Diagnosis not present

## 2015-10-12 DIAGNOSIS — J449 Chronic obstructive pulmonary disease, unspecified: Secondary | ICD-10-CM | POA: Diagnosis not present

## 2015-10-20 DIAGNOSIS — I10 Essential (primary) hypertension: Secondary | ICD-10-CM | POA: Diagnosis not present

## 2015-10-24 DIAGNOSIS — S72142D Displaced intertrochanteric fracture of left femur, subsequent encounter for closed fracture with routine healing: Secondary | ICD-10-CM | POA: Diagnosis not present

## 2015-10-25 DIAGNOSIS — R079 Chest pain, unspecified: Secondary | ICD-10-CM | POA: Diagnosis not present

## 2015-10-25 DIAGNOSIS — I110 Hypertensive heart disease with heart failure: Secondary | ICD-10-CM | POA: Diagnosis not present

## 2015-10-25 DIAGNOSIS — S2232XA Fracture of one rib, left side, initial encounter for closed fracture: Secondary | ICD-10-CM | POA: Diagnosis not present

## 2015-10-25 DIAGNOSIS — T148 Other injury of unspecified body region: Secondary | ICD-10-CM | POA: Diagnosis not present

## 2015-10-25 DIAGNOSIS — I4891 Unspecified atrial fibrillation: Secondary | ICD-10-CM | POA: Diagnosis not present

## 2015-10-25 DIAGNOSIS — T07 Unspecified multiple injuries: Secondary | ICD-10-CM | POA: Diagnosis not present

## 2015-10-25 DIAGNOSIS — S72142D Displaced intertrochanteric fracture of left femur, subsequent encounter for closed fracture with routine healing: Secondary | ICD-10-CM | POA: Diagnosis not present

## 2015-10-25 DIAGNOSIS — N390 Urinary tract infection, site not specified: Secondary | ICD-10-CM | POA: Diagnosis not present

## 2015-10-25 DIAGNOSIS — J449 Chronic obstructive pulmonary disease, unspecified: Secondary | ICD-10-CM | POA: Diagnosis not present

## 2015-10-25 DIAGNOSIS — H409 Unspecified glaucoma: Secondary | ICD-10-CM | POA: Diagnosis not present

## 2015-10-25 DIAGNOSIS — W19XXXA Unspecified fall, initial encounter: Secondary | ICD-10-CM | POA: Diagnosis not present

## 2015-10-25 DIAGNOSIS — R2681 Unsteadiness on feet: Secondary | ICD-10-CM | POA: Diagnosis not present

## 2015-10-25 DIAGNOSIS — R609 Edema, unspecified: Secondary | ICD-10-CM | POA: Diagnosis not present

## 2015-10-25 DIAGNOSIS — J189 Pneumonia, unspecified organism: Secondary | ICD-10-CM | POA: Diagnosis not present

## 2015-10-25 DIAGNOSIS — I509 Heart failure, unspecified: Secondary | ICD-10-CM | POA: Diagnosis not present

## 2015-10-25 DIAGNOSIS — R600 Localized edema: Secondary | ICD-10-CM | POA: Diagnosis not present

## 2015-10-25 DIAGNOSIS — I10 Essential (primary) hypertension: Secondary | ICD-10-CM | POA: Diagnosis not present

## 2015-10-25 DIAGNOSIS — R4182 Altered mental status, unspecified: Secondary | ICD-10-CM | POA: Diagnosis not present

## 2015-10-26 DIAGNOSIS — N390 Urinary tract infection, site not specified: Secondary | ICD-10-CM | POA: Diagnosis not present

## 2015-10-26 DIAGNOSIS — R531 Weakness: Secondary | ICD-10-CM | POA: Diagnosis not present

## 2015-10-26 DIAGNOSIS — W19XXXA Unspecified fall, initial encounter: Secondary | ICD-10-CM | POA: Diagnosis not present

## 2015-10-26 DIAGNOSIS — R4182 Altered mental status, unspecified: Secondary | ICD-10-CM | POA: Diagnosis not present

## 2015-10-27 DIAGNOSIS — R600 Localized edema: Secondary | ICD-10-CM | POA: Diagnosis not present

## 2015-10-27 DIAGNOSIS — W19XXXA Unspecified fall, initial encounter: Secondary | ICD-10-CM | POA: Diagnosis not present

## 2015-10-27 DIAGNOSIS — S79912A Unspecified injury of left hip, initial encounter: Secondary | ICD-10-CM | POA: Diagnosis not present

## 2015-10-27 DIAGNOSIS — R2681 Unsteadiness on feet: Secondary | ICD-10-CM | POA: Diagnosis not present

## 2015-10-27 DIAGNOSIS — N390 Urinary tract infection, site not specified: Secondary | ICD-10-CM | POA: Diagnosis not present

## 2015-10-27 DIAGNOSIS — T84115D Breakdown (mechanical) of internal fixation device of left femur, subsequent encounter: Secondary | ICD-10-CM | POA: Diagnosis not present

## 2015-10-27 DIAGNOSIS — M6281 Muscle weakness (generalized): Secondary | ICD-10-CM | POA: Diagnosis not present

## 2015-10-27 DIAGNOSIS — I4891 Unspecified atrial fibrillation: Secondary | ICD-10-CM | POA: Diagnosis present

## 2015-10-27 DIAGNOSIS — M79662 Pain in left lower leg: Secondary | ICD-10-CM | POA: Diagnosis not present

## 2015-10-27 DIAGNOSIS — J449 Chronic obstructive pulmonary disease, unspecified: Secondary | ICD-10-CM | POA: Diagnosis present

## 2015-10-27 DIAGNOSIS — Z780 Asymptomatic menopausal state: Secondary | ICD-10-CM | POA: Diagnosis not present

## 2015-10-27 DIAGNOSIS — E860 Dehydration: Secondary | ICD-10-CM | POA: Diagnosis not present

## 2015-10-27 DIAGNOSIS — W19XXXD Unspecified fall, subsequent encounter: Secondary | ICD-10-CM | POA: Diagnosis not present

## 2015-10-27 DIAGNOSIS — S2232XA Fracture of one rib, left side, initial encounter for closed fracture: Secondary | ICD-10-CM | POA: Diagnosis present

## 2015-10-27 DIAGNOSIS — Z880 Allergy status to penicillin: Secondary | ICD-10-CM | POA: Diagnosis not present

## 2015-10-27 DIAGNOSIS — Z9181 History of falling: Secondary | ICD-10-CM | POA: Diagnosis not present

## 2015-10-27 DIAGNOSIS — R471 Dysarthria and anarthria: Secondary | ICD-10-CM | POA: Diagnosis not present

## 2015-10-27 DIAGNOSIS — R488 Other symbolic dysfunctions: Secondary | ICD-10-CM | POA: Diagnosis not present

## 2015-10-27 DIAGNOSIS — S7292XD Unspecified fracture of left femur, subsequent encounter for closed fracture with routine healing: Secondary | ICD-10-CM | POA: Diagnosis not present

## 2015-10-27 DIAGNOSIS — S72142D Displaced intertrochanteric fracture of left femur, subsequent encounter for closed fracture with routine healing: Secondary | ICD-10-CM | POA: Diagnosis not present

## 2015-10-27 DIAGNOSIS — Z888 Allergy status to other drugs, medicaments and biological substances status: Secondary | ICD-10-CM | POA: Diagnosis not present

## 2015-10-27 DIAGNOSIS — M25552 Pain in left hip: Secondary | ICD-10-CM | POA: Diagnosis not present

## 2015-10-27 DIAGNOSIS — S79922A Unspecified injury of left thigh, initial encounter: Secondary | ICD-10-CM | POA: Diagnosis not present

## 2015-10-27 DIAGNOSIS — Z7982 Long term (current) use of aspirin: Secondary | ICD-10-CM | POA: Diagnosis not present

## 2015-10-27 DIAGNOSIS — S72142A Displaced intertrochanteric fracture of left femur, initial encounter for closed fracture: Secondary | ICD-10-CM | POA: Diagnosis not present

## 2015-10-27 DIAGNOSIS — R4182 Altered mental status, unspecified: Secondary | ICD-10-CM | POA: Diagnosis not present

## 2015-10-27 DIAGNOSIS — J189 Pneumonia, unspecified organism: Secondary | ICD-10-CM | POA: Diagnosis not present

## 2015-10-27 DIAGNOSIS — R531 Weakness: Secondary | ICD-10-CM | POA: Diagnosis not present

## 2015-10-27 DIAGNOSIS — I110 Hypertensive heart disease with heart failure: Secondary | ICD-10-CM | POA: Diagnosis present

## 2015-10-27 DIAGNOSIS — R1312 Dysphagia, oropharyngeal phase: Secondary | ICD-10-CM | POA: Diagnosis not present

## 2015-10-27 DIAGNOSIS — Z9981 Dependence on supplemental oxygen: Secondary | ICD-10-CM | POA: Diagnosis not present

## 2015-10-27 DIAGNOSIS — I10 Essential (primary) hypertension: Secondary | ICD-10-CM | POA: Diagnosis not present

## 2015-10-27 DIAGNOSIS — R410 Disorientation, unspecified: Secondary | ICD-10-CM | POA: Diagnosis not present

## 2015-10-27 DIAGNOSIS — I509 Heart failure, unspecified: Secondary | ICD-10-CM | POA: Diagnosis present

## 2015-10-27 DIAGNOSIS — H409 Unspecified glaucoma: Secondary | ICD-10-CM | POA: Diagnosis present

## 2015-10-27 DIAGNOSIS — R5381 Other malaise: Secondary | ICD-10-CM | POA: Diagnosis present

## 2015-10-27 DIAGNOSIS — Z79899 Other long term (current) drug therapy: Secondary | ICD-10-CM | POA: Diagnosis not present

## 2015-10-31 DIAGNOSIS — T17890A Other foreign object in other parts of respiratory tract causing asphyxiation, initial encounter: Secondary | ICD-10-CM | POA: Diagnosis not present

## 2015-10-31 DIAGNOSIS — R471 Dysarthria and anarthria: Secondary | ICD-10-CM | POA: Diagnosis not present

## 2015-10-31 DIAGNOSIS — D696 Thrombocytopenia, unspecified: Secondary | ICD-10-CM | POA: Diagnosis not present

## 2015-10-31 DIAGNOSIS — T17300D Unspecified foreign body in larynx causing asphyxiation, subsequent encounter: Secondary | ICD-10-CM | POA: Diagnosis not present

## 2015-10-31 DIAGNOSIS — R1312 Dysphagia, oropharyngeal phase: Secondary | ICD-10-CM | POA: Diagnosis not present

## 2015-10-31 DIAGNOSIS — R0989 Other specified symptoms and signs involving the circulatory and respiratory systems: Secondary | ICD-10-CM | POA: Diagnosis not present

## 2015-10-31 DIAGNOSIS — W19XXXD Unspecified fall, subsequent encounter: Secondary | ICD-10-CM | POA: Diagnosis not present

## 2015-10-31 DIAGNOSIS — J9611 Chronic respiratory failure with hypoxia: Secondary | ICD-10-CM | POA: Diagnosis not present

## 2015-10-31 DIAGNOSIS — Z9181 History of falling: Secondary | ICD-10-CM | POA: Diagnosis not present

## 2015-10-31 DIAGNOSIS — Z4789 Encounter for other orthopedic aftercare: Secondary | ICD-10-CM | POA: Diagnosis not present

## 2015-10-31 DIAGNOSIS — R2681 Unsteadiness on feet: Secondary | ICD-10-CM | POA: Diagnosis not present

## 2015-10-31 DIAGNOSIS — M25552 Pain in left hip: Secondary | ICD-10-CM | POA: Diagnosis not present

## 2015-10-31 DIAGNOSIS — I110 Hypertensive heart disease with heart failure: Secondary | ICD-10-CM | POA: Diagnosis not present

## 2015-10-31 DIAGNOSIS — J449 Chronic obstructive pulmonary disease, unspecified: Secondary | ICD-10-CM | POA: Diagnosis not present

## 2015-10-31 DIAGNOSIS — M6281 Muscle weakness (generalized): Secondary | ICD-10-CM | POA: Diagnosis not present

## 2015-10-31 DIAGNOSIS — I509 Heart failure, unspecified: Secondary | ICD-10-CM | POA: Diagnosis not present

## 2015-10-31 DIAGNOSIS — N3000 Acute cystitis without hematuria: Secondary | ICD-10-CM | POA: Diagnosis not present

## 2015-10-31 DIAGNOSIS — R600 Localized edema: Secondary | ICD-10-CM | POA: Diagnosis not present

## 2015-10-31 DIAGNOSIS — R933 Abnormal findings on diagnostic imaging of other parts of digestive tract: Secondary | ICD-10-CM | POA: Diagnosis not present

## 2015-10-31 DIAGNOSIS — S81802D Unspecified open wound, left lower leg, subsequent encounter: Secondary | ICD-10-CM | POA: Diagnosis not present

## 2015-10-31 DIAGNOSIS — R410 Disorientation, unspecified: Secondary | ICD-10-CM | POA: Diagnosis not present

## 2015-10-31 DIAGNOSIS — M8440XA Pathological fracture, unspecified site, initial encounter for fracture: Secondary | ICD-10-CM | POA: Diagnosis not present

## 2015-10-31 DIAGNOSIS — S72142D Displaced intertrochanteric fracture of left femur, subsequent encounter for closed fracture with routine healing: Secondary | ICD-10-CM | POA: Diagnosis not present

## 2015-10-31 DIAGNOSIS — G9340 Encephalopathy, unspecified: Secondary | ICD-10-CM | POA: Diagnosis not present

## 2015-10-31 DIAGNOSIS — F0390 Unspecified dementia without behavioral disturbance: Secondary | ICD-10-CM | POA: Diagnosis not present

## 2015-10-31 DIAGNOSIS — T84115D Breakdown (mechanical) of internal fixation device of left femur, subsequent encounter: Secondary | ICD-10-CM | POA: Diagnosis not present

## 2015-10-31 DIAGNOSIS — R479 Unspecified speech disturbances: Secondary | ICD-10-CM | POA: Diagnosis not present

## 2015-10-31 DIAGNOSIS — S7292XD Unspecified fracture of left femur, subsequent encounter for closed fracture with routine healing: Secondary | ICD-10-CM | POA: Diagnosis not present

## 2015-10-31 DIAGNOSIS — R488 Other symbolic dysfunctions: Secondary | ICD-10-CM | POA: Diagnosis not present

## 2015-10-31 DIAGNOSIS — I4891 Unspecified atrial fibrillation: Secondary | ICD-10-CM | POA: Diagnosis not present

## 2015-10-31 DIAGNOSIS — I10 Essential (primary) hypertension: Secondary | ICD-10-CM | POA: Diagnosis not present

## 2015-10-31 DIAGNOSIS — R4182 Altered mental status, unspecified: Secondary | ICD-10-CM | POA: Diagnosis not present

## 2015-10-31 DIAGNOSIS — D692 Other nonthrombocytopenic purpura: Secondary | ICD-10-CM | POA: Diagnosis not present

## 2015-10-31 DIAGNOSIS — S81802A Unspecified open wound, left lower leg, initial encounter: Secondary | ICD-10-CM | POA: Diagnosis not present

## 2015-10-31 DIAGNOSIS — Z7189 Other specified counseling: Secondary | ICD-10-CM | POA: Diagnosis not present

## 2015-10-31 DIAGNOSIS — R1310 Dysphagia, unspecified: Secondary | ICD-10-CM | POA: Diagnosis not present

## 2015-10-31 DIAGNOSIS — I480 Paroxysmal atrial fibrillation: Secondary | ICD-10-CM | POA: Diagnosis not present

## 2015-11-01 ENCOUNTER — Non-Acute Institutional Stay (SKILLED_NURSING_FACILITY): Payer: Medicare Other | Admitting: Internal Medicine

## 2015-11-01 ENCOUNTER — Encounter: Payer: Self-pay | Admitting: Internal Medicine

## 2015-11-01 DIAGNOSIS — N3000 Acute cystitis without hematuria: Secondary | ICD-10-CM

## 2015-11-01 DIAGNOSIS — R600 Localized edema: Secondary | ICD-10-CM | POA: Diagnosis not present

## 2015-11-01 DIAGNOSIS — J9611 Chronic respiratory failure with hypoxia: Secondary | ICD-10-CM | POA: Diagnosis not present

## 2015-11-01 DIAGNOSIS — I110 Hypertensive heart disease with heart failure: Secondary | ICD-10-CM | POA: Diagnosis not present

## 2015-11-01 DIAGNOSIS — G9340 Encephalopathy, unspecified: Secondary | ICD-10-CM

## 2015-11-01 DIAGNOSIS — I480 Paroxysmal atrial fibrillation: Secondary | ICD-10-CM

## 2015-11-01 DIAGNOSIS — M25552 Pain in left hip: Secondary | ICD-10-CM

## 2015-11-01 DIAGNOSIS — I509 Heart failure, unspecified: Secondary | ICD-10-CM

## 2015-11-01 DIAGNOSIS — J449 Chronic obstructive pulmonary disease, unspecified: Secondary | ICD-10-CM | POA: Diagnosis not present

## 2015-11-01 NOTE — Progress Notes (Addendum)
MRN: [id number] Name: Mariah [name]  Sex: female Age: 81 y.o. DOB: 03-03-1919  [place] #: [facility] Facility/Room:511 Level Of Care: SNF Provider: [name] D Emergency Contacts: Extended Emergency Contact Information Primary Emergency Contact: [name],[name] Address: [address]          [place], [place] [address] [name] of [place] Phone: [phone] Relation: Daughter  Code Status:   Allergies: Lyrica and Penicillins  Chief Complaint  Patient presents with  . New Admit To SNF    HPI: Patient is 81 y.o. female with chronic respiratory failure COPD, CHF , PAF who was hospitalized at [facility] from 12/6-12 s/p fall and MS change. Work-up of fall, revealed loosening of components of L hip fx repair form 2 months ago but conservative care only was recommended. It was felt that confusion was 2/2 UTI, maybe an early PNA,, tx with IV rocephin. Pt's confusion improved but not totally resolved at D/c. Pt is admitted to SNF for generalized weakness and need for higher level of care. While at SNF pt will be followed for HTN, tx with lasix, spironolactone and metoprolol, CHF, tx with lasix, spironolactone and metoprolol and vitamin D def, tx with vit d supplement.  Past Medical History  Diagnosis Date  . COPD (chronic obstructive pulmonary disease) ([place])   . Atrial fibrillation ([place])   . Hypertension   . CHF (congestive heart failure) [facility])     Past Surgical History  Procedure Laterality Date  . Hernia repair    . Femur im nail Left 09/09/2015    Procedure: INTRAMEDULLARY (IM) NAIL FEMORAL;  Surgeon: Rod Can, MD;  Location: [facility] ORS;  Service: Orthopedics;  Laterality: Left;      Medication List       This list is accurate as of: 11/01/15 11:59 PM.  Always use your most recent med list.               acetaminophen 500 MG tablet  Commonly known as:  TYLENOL  Take 1,000 mg by mouth 3 (three) times daily.     aspirin 325 MG tablet  Take 325 mg by mouth daily.      brimonidine 0.1 % Soln  Commonly known as:  ALPHAGAN P  Place 1 drop into both eyes 2 (two) times daily.     furosemide 40 MG tablet  Commonly known as:  LASIX  Take 20 mg by mouth daily.     gabapentin 100 MG capsule  Commonly known as:  NEURONTIN  Take 100 mg by mouth at bedtime.     HYDROcodone-acetaminophen 5-325 MG tablet  Commonly known as:  NORCO/VICODIN  Take 1 tablet by mouth every 6 (six) hours as needed for severe pain.     latanoprost 0.005 % ophthalmic solution  Commonly known as:  XALATAN  Place 1 drop into both eyes at bedtime.     Melatonin 1 MG Tabs  Take 1 tablet by mouth daily.     metoprolol succinate 50 MG 24 hr tablet  Commonly known as:  TOPROL-XL  Take 50 mg by mouth daily. Take with or immediately following a meal.     mirtazapine 15 MG tablet  Commonly known as:  REMERON  Take 15 mg by mouth at bedtime as needed (sleep).     spironolactone 12.5 mg Tabs tablet  Commonly known as:  ALDACTONE  Take 6.25 mg by mouth daily.     Vitamin D3 5000 UNITS Caps  Take 1 capsule by mouth daily.  Meds ordered this encounter  Medications  . Cholecalciferol (VITAMIN D3) 5000 UNITS CAPS    Sig: Take 1 capsule by mouth daily.  Marland Kitchen aspirin 325 MG tablet    Sig: Take 325 mg by mouth daily.  Marland Kitchen spironolactone (ALDACTONE) 12.5 mg TABS tablet    Sig: Take 6.25 mg by mouth daily.  Marland Kitchen gabapentin (NEURONTIN) 100 MG capsule    Sig: Take 100 mg by mouth at bedtime.     There is no immunization history on file for this patient.  Social History  Substance Use Topics  . Smoking status: Never Smoker   . Smokeless tobacco: Not on file  . Alcohol Use: No    Family history is + CA  Review of Systems  UTO from pt 2/2 confusion; nursing without concerns    Filed Vitals:   11/01/15 1435  BP: 142/85  Pulse: 88  Temp: 97.1 F (36.2 C)  Resp: 20    SpO2 Readings from Last 1 Encounters:  09/13/15 99%        Physical Exam  GENERAL APPEARANCE:  Alert, mod conversant,  No acute distress.  SKIN: No diaphoresis rash HEAD: Normocephalic, atraumatic  EYES: Conjunctiva/lids clear. Pupils round, reactive. EOMs intact.  EARS: External exam WNL, canals clear. Hearing grossly normal.  NOSE: No deformity or discharge.  MOUTH/THROAT: Lips w/o lesions  RESPIRATORY: Breathing is even, unlabored. Lung sounds are clear   CARDIOVASCULAR: Heart irreg 3/6 murmur, no rubs or gallops. + peripheral edema.   GASTROINTESTINAL: Abdomen is soft, non-tender, not distended w/ normal bowel sounds. GENITOURINARY: Bladder non tender, not distended  MUSCULOSKELETAL: No abnormal joints or musculature NEUROLOGIC:  Cranial nerves 2-12 grossly intact. Moves all extremities  PSYCHIATRIC: dementia and some confusion, no behavioral issues  Patient Active Problem List   Diagnosis Date Noted  . Hip pain, acute 11/06/2015  . Acute encephalopathy 11/06/2015  . Bilateral lower extremity edema 11/06/2015  . UTI (urinary tract infection) 11/06/2015  . Chronic respiratory failure with hypoxia ([place]) 11/06/2015  . Closed left hip fracture ([place]) 09/09/2015  . CHF (congestive heart failure) ([place]) 09/09/2015  . COPD (chronic obstructive pulmonary disease) ([place])   . Atrial fibrillation ([place])   . Hypertensive heart disease with CHF (congestive heart failure) ([place])   . Chronic bronchitis ([place])   . Essential hypertension   . Fall   . Closed pertrochanteric fracture (H[facility])     CBC    Component Value Date/Time   WBC 9.0 09/13/2015 0448   RBC 2.89* 09/13/2015 0448   HGB 9.4* 09/13/2015 0448   HCT 28.7* 09/13/2015 0448   PLT 137* 09/13/2015 0448   MCV 99.3 09/13/2015 0448   LYMPHSABS 1.1 09/08/2015 2325   MONOABS 0.8 09/08/2015 2325   EOSABS 0.1 09/08/2015 2325   BASOSABS 0.0 09/08/2015 2325    CMP     Component Value Date/Time   NA 138 09/13/2015 0448   K 4.3 09/13/2015 0448   CL 107 09/13/2015 0448   CO2 24 09/13/2015 0448   GLUCOSE 101* 09/13/2015 0448   BUN  16 09/13/2015 0448   CREATININE 0.66 09/13/2015 0448   CALCIUM 8.7* 09/13/2015 0448   PROT 6.9 09/08/2015 2325   ALBUMIN 4.1 09/08/2015 2325   AST 26 09/08/2015 2325   ALT 11* 09/08/2015 2325   ALKPHOS 56 09/08/2015 2325   BILITOT 1.3* 09/08/2015 2325   GFRNONAA >60 09/13/2015 0448   GFRAA >60 09/13/2015 0448    No results found for: HGBA1C   Dg Chest 2  View  09/08/2015  CLINICAL DATA:  Hip fracture EXAM: CHEST - 2 VIEW COMPARISON:  None. FINDINGS: Cardiac shadow is enlarged. The thoracic aorta is tortuous with calcification. No aneurysmal dilatation is seen. Blunting of left costophrenic angle is noted which may represent a small effusion but may be chronic in nature. No acute bony abnormality is seen. IMPRESSION: Blunting of left costophrenic angle. Cardiomegaly. Electronically Signed   By: Inez Catalina M.D.   On: 09/08/2015 23:52   Dg Knee 1-2 Views Left  09/09/2015  CLINICAL DATA:  81 year old female with left hip fracture after falling yesterday. Pain radiates toward the knee. EXAM: LEFT KNEE - 1-2 VIEW COMPARISON:  None. FINDINGS: No evidence of acute fracture, malalignment or joint effusion. There is narrowing of the medial compartmental joint space consistent with osteoarthritis. Atherosclerotic calcifications noted in the superficial femoral artery and runoff vessels. IMPRESSION: 1. No acute fracture, malalignment or joint effusion. 2. Medial compartmental osteoarthritis. 3. Atherosclerotic vascular calcifications. Electronically Signed   By: Jacqulynn Cadet M.D.   On: 09/09/2015 07:39   Ct Head Wo Contrast  09/08/2015  CLINICAL DATA:  Fall today with blunt head trauma EXAM: CT HEAD WITHOUT CONTRAST TECHNIQUE: Contiguous axial images were obtained from the base of the skull through the vertex without intravenous contrast. COMPARISON:  None. FINDINGS: Bony calvarium is intact. No gross soft tissue abnormality is noted. Mild atrophic changes are seen. No findings to suggest acute  hemorrhage, acute infarction or space-occupying mass lesion are noted. Changes of chronic white matter ischemic change are noted as well. IMPRESSION: Mild chronic atrophic and ischemic changes without acute abnormality. Electronically Signed   By: Inez Catalina M.D.   On: 09/08/2015 23:37   Pelvis Portable  09/09/2015  CLINICAL DATA:  STAT READ. CALL REPORT TO [id number]. Status post op left femoral IM rod. Imaging done in [facility] PACU. EXAM: PORTABLE PELVIS 1-2 VIEWS COMPARISON:  None. FINDINGS: Interval placement of a left intra medullary nail interlocking cannulated femoral neck screw transfixing a comminuted intertrochanteric fracture. There is medial displacement of the lesser trochanteric fracture fragment. There is 8 mm of lateral displacement of the greater trochanteric fragment relative to the subtrochanteric femur. There is 8 mm gap at the medial femoral neck and intertrochanteric region. There is no right hip fracture or dislocation. There is generalized osteopenia. There degenerative changes of the lower lumbar spine. There is a calcified pelvic mass most consistent with a uterine fibroid. IMPRESSION: Interval ORIF left intertrochanteric fracture as detailed above. Electronically Signed   By: Kathreen Devoid   On: 09/09/2015 14:44   Dg C-arm 61-120 Min-no Report  09/09/2015  CLINICAL DATA: surgery C-ARM 61-120 MINUTES Fluoroscopy was utilized by the requesting physician.  No radiographic interpretation.   Dg Hip Unilat With Pelvis 2-3 Views Left  09/08/2015  CLINICAL DATA:  Fall today with left hip pain, initial encounter EXAM: DG HIP (WITH OR WITHOUT PELVIS) 2-3V LEFT COMPARISON:  None. FINDINGS: There is a comminuted fracture through the intratrochanteric region with impaction and angulation at the fracture site. The pelvic ring is intact. Calcified uterine fibroid is noted. IMPRESSION: Comminuted fracture of the proximal left femur. Electronically Signed   By: Inez Catalina M.D.   On: 09/08/2015 23:53    Dg Femur Min 2 Views Left  09/09/2015  CLINICAL DATA:  ORIF left hip fracture EXAM: LEFT FEMUR 2 VIEWS COMPARISON:  None. FINDINGS: Interval placement of a left intra medullary nail interlocking cannulated femoral neck screw transfixing a comminuted intertrochanteric fracture. There  is medial displacement of the lesser trochanteric fracture fragment. There is 8 mm of lateral displacement of the greater trochanteric fragment relative to the subtrochanteric femur. There is 8 mm gap at the medial femoral neck and intertrochanteric region. There are postsurgical changes in the surrounding soft tissues. There is medial femorotibial compartment joint space narrowing and marginal osteophytosis. There is a calcified pelvic mass most consistent with a uterine fibroid. IMPRESSION: Interval ORIF left intertrochanteric fracture as detailed above. Electronically Signed   By: Kathreen Devoid   On: 09/09/2015 14:46   Dg Femur Min 2 Views Left  09/09/2015  CLINICAL DATA:  Intertrochanteric hip fracture fixation EXAM: DG C-ARM 61-120 MIN-NO REPORT; LEFT FEMUR 2 VIEWS COMPARISON:  09/08/2015 FINDINGS: There is a long gamma nail, a proximal dynamic hip screw and a single distal interlocking screw transfixing the intertrochanteric fracture with near anatomic reduction. IMPRESSION: Internal fixation of left hip fracture with anatomic reduction and no complicating features. Electronically Signed   By: Marijo Sanes M.D.   On: 09/09/2015 13:27    Not all labs, radiology exams or other studies done during hospitalization come through on my EPIC note; however they are reviewed by me.    Assessment and Plan  Hip pain, acute With fall and h/o ORIF recently; Xrays showed increased distractionof the fracture fragment of L hip and loosening of distal fixation screwin the medullary rod; ortho, Dr Linton Rump consulted and rec no intervention at this time, with minimal weightbearing SNF - admit for OT/PT and supportive care  Acute  encephalopathy Felt 2/2 UTI and possible early PNA;pt tx with antibiotic but neg growth to date; improving but not resolved at d/c; SNF - will cont to monitor  CHF (congestive heart failure) ([place]) Reported poor po intake so lasix dec to 20 mg daily; SNF - cont metoprolol and lasix  Bilateral lower extremity edema SNF - Chronic and stable ; cont lasix and BMP to follow renal fx  Hypertensive heart disease with CHF (congestive heart failure) (H[facility]) SNF - stable on  XL 50 mg daily, lasix 20 mg daily and spironolactone 6.25 mg daily  Atrial fibrillation ([facility]) SNF = chronic, controlled on metoprolol XR 50 mg dailt;prophylaxis with ASA only 2/2 fall risk  COPD (chronic obstructive pulmonary disease) ([place]) SNF - O2 dependent and stable on supplemental oxygen  UTI (urinary tract infection) SNF - cont levaquin for 4 more days  Chronic respiratory failure with hypoxia (HCC) SNF - 2/2 endstage COPD; cont O2 2L   Time spent > 45 min;> 50% of time with patient was spent reviewing records, labs, tests and studies, counseling and developing plan of care  [name], MD

## 2015-11-02 DIAGNOSIS — S72142D Displaced intertrochanteric fracture of left femur, subsequent encounter for closed fracture with routine healing: Secondary | ICD-10-CM | POA: Diagnosis not present

## 2015-11-06 ENCOUNTER — Encounter: Payer: Self-pay | Admitting: Internal Medicine

## 2015-11-06 DIAGNOSIS — J9611 Chronic respiratory failure with hypoxia: Secondary | ICD-10-CM

## 2015-11-06 DIAGNOSIS — G9340 Encephalopathy, unspecified: Secondary | ICD-10-CM | POA: Insufficient documentation

## 2015-11-06 DIAGNOSIS — R600 Localized edema: Secondary | ICD-10-CM | POA: Insufficient documentation

## 2015-11-06 DIAGNOSIS — M25559 Pain in unspecified hip: Secondary | ICD-10-CM | POA: Insufficient documentation

## 2015-11-06 DIAGNOSIS — N390 Urinary tract infection, site not specified: Secondary | ICD-10-CM | POA: Insufficient documentation

## 2015-11-06 HISTORY — DX: Chronic respiratory failure with hypoxia: J96.11

## 2015-11-06 HISTORY — DX: Encephalopathy, unspecified: G93.40

## 2015-11-06 NOTE — Assessment & Plan Note (Signed)
Reported poor po intake so lasix dec to 20 mg daily; SNF - cont metoprolol and lasix

## 2015-11-06 NOTE — Assessment & Plan Note (Addendum)
SNF - Chronic and stable ; cont lasix and BMP to follow renal fx

## 2015-11-06 NOTE — Assessment & Plan Note (Signed)
SNF = chronic, controlled on metoprolol XR 50 mg dailt;prophylaxis with ASA only 2/2 fall risk

## 2015-11-06 NOTE — Assessment & Plan Note (Signed)
SNF - cont levaquin for 4 more days

## 2015-11-06 NOTE — Assessment & Plan Note (Signed)
SNF - stable on  XL 50 mg daily, lasix 20 mg daily and spironolactone 6.25 mg daily

## 2015-11-06 NOTE — Assessment & Plan Note (Addendum)
With fall and h/o ORIF recently; Xrays showed increased distractionof the fracture fragment of L hip and loosening of distal fixation screwin the medullary rod; ortho, Dr Linton Rump consulted and rec no intervention at this time, with minimal weightbearing SNF - admit for OT/PT and supportive care

## 2015-11-06 NOTE — Assessment & Plan Note (Signed)
SNF - 2/2 endstage COPD; cont O2 2L

## 2015-11-06 NOTE — Assessment & Plan Note (Signed)
SNF - O2 dependent and stable on supplemental oxygen

## 2015-11-06 NOTE — Assessment & Plan Note (Signed)
Felt 2/2 UTI and possible early PNA;pt tx with antibiotic but neg growth to date; improving but not resolved at d/c; SNF - will cont to monitor

## 2015-11-08 DIAGNOSIS — S81802A Unspecified open wound, left lower leg, initial encounter: Secondary | ICD-10-CM | POA: Diagnosis not present

## 2015-11-08 DIAGNOSIS — S81802D Unspecified open wound, left lower leg, subsequent encounter: Secondary | ICD-10-CM | POA: Diagnosis not present

## 2015-11-15 DIAGNOSIS — S81802D Unspecified open wound, left lower leg, subsequent encounter: Secondary | ICD-10-CM | POA: Diagnosis not present

## 2015-11-15 DIAGNOSIS — S81802A Unspecified open wound, left lower leg, initial encounter: Secondary | ICD-10-CM | POA: Diagnosis not present

## 2015-11-19 DIAGNOSIS — M8440XA Pathological fracture, unspecified site, initial encounter for fracture: Secondary | ICD-10-CM | POA: Diagnosis not present

## 2015-11-19 DIAGNOSIS — J449 Chronic obstructive pulmonary disease, unspecified: Secondary | ICD-10-CM | POA: Diagnosis not present

## 2015-11-20 DIAGNOSIS — J9611 Chronic respiratory failure with hypoxia: Secondary | ICD-10-CM | POA: Diagnosis not present

## 2015-11-20 DIAGNOSIS — R2681 Unsteadiness on feet: Secondary | ICD-10-CM | POA: Diagnosis not present

## 2015-11-20 DIAGNOSIS — I480 Paroxysmal atrial fibrillation: Secondary | ICD-10-CM | POA: Diagnosis not present

## 2015-11-20 DIAGNOSIS — D692 Other nonthrombocytopenic purpura: Secondary | ICD-10-CM | POA: Diagnosis not present

## 2015-11-20 DIAGNOSIS — I10 Essential (primary) hypertension: Secondary | ICD-10-CM | POA: Diagnosis not present

## 2015-11-20 DIAGNOSIS — D696 Thrombocytopenia, unspecified: Secondary | ICD-10-CM | POA: Diagnosis not present

## 2015-11-20 DIAGNOSIS — Z4789 Encounter for other orthopedic aftercare: Secondary | ICD-10-CM | POA: Diagnosis not present

## 2015-11-20 DIAGNOSIS — R600 Localized edema: Secondary | ICD-10-CM | POA: Diagnosis not present

## 2015-11-20 DIAGNOSIS — I110 Hypertensive heart disease with heart failure: Secondary | ICD-10-CM | POA: Diagnosis not present

## 2015-11-20 DIAGNOSIS — R479 Unspecified speech disturbances: Secondary | ICD-10-CM | POA: Diagnosis not present

## 2015-11-20 DIAGNOSIS — F0390 Unspecified dementia without behavioral disturbance: Secondary | ICD-10-CM | POA: Diagnosis not present

## 2015-11-20 DIAGNOSIS — R471 Dysarthria and anarthria: Secondary | ICD-10-CM | POA: Diagnosis not present

## 2015-11-20 DIAGNOSIS — R0989 Other specified symptoms and signs involving the circulatory and respiratory systems: Secondary | ICD-10-CM | POA: Diagnosis not present

## 2015-11-20 DIAGNOSIS — I4891 Unspecified atrial fibrillation: Secondary | ICD-10-CM | POA: Diagnosis not present

## 2015-11-20 DIAGNOSIS — S7292XD Unspecified fracture of left femur, subsequent encounter for closed fracture with routine healing: Secondary | ICD-10-CM | POA: Diagnosis not present

## 2015-11-20 DIAGNOSIS — R933 Abnormal findings on diagnostic imaging of other parts of digestive tract: Secondary | ICD-10-CM | POA: Diagnosis not present

## 2015-11-20 DIAGNOSIS — Z9181 History of falling: Secondary | ICD-10-CM | POA: Diagnosis not present

## 2015-11-20 DIAGNOSIS — T17890A Other foreign object in other parts of respiratory tract causing asphyxiation, initial encounter: Secondary | ICD-10-CM | POA: Diagnosis not present

## 2015-11-20 DIAGNOSIS — G9340 Encephalopathy, unspecified: Secondary | ICD-10-CM | POA: Diagnosis not present

## 2015-11-20 DIAGNOSIS — T84115D Breakdown (mechanical) of internal fixation device of left femur, subsequent encounter: Secondary | ICD-10-CM | POA: Diagnosis not present

## 2015-11-20 DIAGNOSIS — S81802D Unspecified open wound, left lower leg, subsequent encounter: Secondary | ICD-10-CM | POA: Diagnosis not present

## 2015-11-20 DIAGNOSIS — M6281 Muscle weakness (generalized): Secondary | ICD-10-CM | POA: Diagnosis not present

## 2015-11-20 DIAGNOSIS — R1310 Dysphagia, unspecified: Secondary | ICD-10-CM | POA: Diagnosis present

## 2015-11-20 DIAGNOSIS — Z7189 Other specified counseling: Secondary | ICD-10-CM | POA: Diagnosis not present

## 2015-11-20 DIAGNOSIS — T17300D Unspecified foreign body in larynx causing asphyxiation, subsequent encounter: Secondary | ICD-10-CM | POA: Diagnosis not present

## 2015-11-20 DIAGNOSIS — S72142D Displaced intertrochanteric fracture of left femur, subsequent encounter for closed fracture with routine healing: Secondary | ICD-10-CM | POA: Diagnosis not present

## 2015-11-20 DIAGNOSIS — R1312 Dysphagia, oropharyngeal phase: Secondary | ICD-10-CM | POA: Diagnosis not present

## 2015-11-20 DIAGNOSIS — I509 Heart failure, unspecified: Secondary | ICD-10-CM | POA: Diagnosis not present

## 2015-11-24 DIAGNOSIS — S81802D Unspecified open wound, left lower leg, subsequent encounter: Secondary | ICD-10-CM | POA: Diagnosis not present

## 2015-11-25 ENCOUNTER — Non-Acute Institutional Stay (SKILLED_NURSING_FACILITY): Payer: Medicare Other | Admitting: Internal Medicine

## 2015-11-25 ENCOUNTER — Encounter: Payer: Self-pay | Admitting: Internal Medicine

## 2015-11-25 DIAGNOSIS — G9340 Encephalopathy, unspecified: Secondary | ICD-10-CM

## 2015-11-25 NOTE — Assessment & Plan Note (Addendum)
Initial thought when this was being described over the phone was a stroke possibly. Now that I've seen her I don't think so. CXR, BMP,CBC, U/A with C and S, all stat, have been ordered.   Later entry- CXR is back, no infiltrate. Labs are not yet back. As it is going to snow today and tomorrow, everything will be more difficult, will start pt on Cipro 500 mg BID for presumed UTI. Will monitor response and for any other new sx.

## 2015-11-25 NOTE — Progress Notes (Addendum)
MRN: [id number] Name: Mariah [name]  Sex: female Age: 82 y.o. DOB: 06-05-1919  [place] [facility]: [facility] Facility/Room:511 Level Of Care: SNF Provider: [name] D Emergency Contacts: Extended Emergency Contact Information Primary Emergency Contact: [name],[name] Address: [address]          [place], [place] [address] [name] of [place] Phone: [phone] Relation: Daughter  Code Status:   Allergies: Lyrica and Penicillins  Chief Complaint  Patient presents with  . Acute Visit    HPI: Patient is 82 y.o. female with chronic respiratory failure COPD, CHF , PAF who is being seen  because nursing reports pt has confusion onset today. No fever, VS are stable. Speech is a little garbled but pt has no new focal neurologic sign. Nothing makes it better or worsePt does have hx UTI's.  Past Medical History  Diagnosis Date  . COPD (chronic obstructive pulmonary disease) ([place])   . Atrial fibrillation ([place])   . Hypertension   . CHF (congestive heart failure) [facility])     Past Surgical History  Procedure Laterality Date  . Hernia repair    . Femur im nail Left 09/09/2015    Procedure: INTRAMEDULLARY (IM) NAIL FEMORAL;  Surgeon: Rod Can, MD;  Location: [facility];  Service: Orthopedics;  Laterality: Left;      Medication List       This list is accurate as of: 11/25/15 11:59 PM.  Always use your most recent med list.               acetaminophen 500 MG tablet  Commonly known as:  TYLENOL  Take 1,000 mg by mouth 3 (three) times daily.     aspirin 325 MG tablet  Take 325 mg by mouth daily.     brimonidine 0.1 % Soln  Commonly known as:  ALPHAGAN P  Place 1 drop into both eyes 2 (two) times daily.     furosemide 40 MG tablet  Commonly known as:  LASIX  Take 20 mg by mouth daily.     gabapentin 100 MG capsule  Commonly known as:  NEURONTIN  Take 100 mg by mouth at bedtime.     HYDROcodone-acetaminophen 5-325 MG tablet  Commonly known as:  NORCO/VICODIN  Take  1 tablet by mouth every 6 (six) hours as needed for severe pain.     latanoprost 0.005 % ophthalmic solution  Commonly known as:  XALATAN  Place 1 drop into both eyes at bedtime.     Melatonin 1 MG Tabs  Take 1 tablet by mouth daily.     metoprolol succinate 50 MG 24 hr tablet  Commonly known as:  TOPROL-XL  Take 50 mg by mouth daily. Take with or immediately following a meal.     mirtazapine 15 MG tablet  Commonly known as:  REMERON  Take 15 mg by mouth at bedtime as needed (sleep).     spironolactone 12.5 mg Tabs tablet  Commonly known as:  ALDACTONE  Take 6.25 mg by mouth daily.     Vitamin D3 5000 units Caps  Take 1 capsule by mouth daily.        No orders of the defined types were placed in this encounter.     There is no immunization history on file for this patient.  Social History  Substance Use Topics  . Smoking status: Never Smoker   . Smokeless tobacco: Not on file  . Alcohol Use: No    Review of Systems  DATA OBTAINED: from nurse  GENERAL:  no fevers, fatigue, appetite changes SKIN: No itching, rash HEENT: No complaint RESPIRATORY: No cough, wheezing, SOB CARDIAC: No chest pain, palpitations, lower extremity edema  GI: No abdominal pain, No N/V/D or constipation, No heartburn or reflux  GU: No dysuria, frequency or urgency, or incontinence  MUSCULOSKELETAL: No unrelieved bone/joint pain NEUROLOGIC: No headache, dizziness, no new focal weakness PSYCHIATRIC: No overt anxiety or sadness  Filed Vitals:   11/25/15 1228  BP: 113/65  Pulse: 92  Temp: 97.2 F (36.2 C)  Resp: 18    Physical Exam  GENERAL APPEARANCE: Alert, conversant , No acute distress  SKIN: No diaphoresis rash HEENT: Unremarkable RESPIRATORY: Breathing is even, unlabored. Lung sounds are clear   CARDIOVASCULAR: Heart irreg ,3/6  Murmur intermiddent, rubs or gallops. 1+ peripheral edema  GASTROINTESTINAL: Abdomen is soft, non-tender, not distended w/ normal bowel sounds.   GENITOURINARY: Bladder non tender, not distended  MUSCULOSKELETAL: No abnormal joints or musculature NEUROLOGIC: Cranial nerves 2-12 grossly intact. Moves all extremities; pt's speech is garbled her words make sense, but conversation is out of context PSYCHIATRIC: happy confusion, no behavioral issues  Patient Active Problem List   Diagnosis Date Noted  . Hip pain, acute 11/06/2015  . Acute encephalopathy 11/06/2015  . Bilateral lower extremity edema 11/06/2015  . UTI (urinary tract infection) 11/06/2015  . Chronic respiratory failure with hypoxia ([place]) 11/06/2015  . Closed left hip fracture ([place]) 09/09/2015  . CHF (congestive heart failure) ([place]) 09/09/2015  . COPD (chronic obstructive pulmonary disease) ([place])   . Atrial fibrillation ([place])   . Hypertensive heart disease with CHF (congestive heart failure) ([place])   . Chronic bronchitis ([place])   . Essential hypertension   . Fall   . Closed pertrochanteric fracture ([facility])     CBC    Component Value Date/Time   WBC 9.0 09/13/2015 0448   RBC 2.89* 09/13/2015 0448   HGB 9.4* 09/13/2015 0448   HCT 28.7* 09/13/2015 0448   PLT 137* 09/13/2015 0448   MCV 99.3 09/13/2015 0448   LYMPHSABS 1.1 09/08/2015 2325   MONOABS 0.8 09/08/2015 2325   EOSABS 0.1 09/08/2015 2325   BASOSABS 0.0 09/08/2015 2325    CMP     Component Value Date/Time   NA 138 09/13/2015 0448   K 4.3 09/13/2015 0448   CL 107 09/13/2015 0448   CO2 24 09/13/2015 0448   GLUCOSE 101* 09/13/2015 0448   BUN 16 09/13/2015 0448   CREATININE 0.66 09/13/2015 0448   CALCIUM 8.7* 09/13/2015 0448   PROT 6.9 09/08/2015 2325   ALBUMIN 4.1 09/08/2015 2325   AST 26 09/08/2015 2325   ALT 11* 09/08/2015 2325   ALKPHOS 56 09/08/2015 2325   BILITOT 1.3* 09/08/2015 2325   GFRNONAA >60 09/13/2015 0448   GFRAA >60 09/13/2015 0448    Assessment and Plan  Acute encephalopathy Initial thought when this was being described over the phone was a stroke possibly. Now that I've seen  her I don't think so. CXR, BMP,CBC, U/A with C and S, all stat, have been ordered.   Later entry- CXR is back, no infiltrate. Labs are not yet back. As it is going to snow today and tomorrow, everything will be more difficult, will start pt on Cipro 500 mg BID for presumed UTI. Will monitor response and for any other new sx.    [name], MD

## 2015-11-26 ENCOUNTER — Encounter: Payer: Self-pay | Admitting: Internal Medicine

## 2015-11-29 ENCOUNTER — Non-Acute Institutional Stay (SKILLED_NURSING_FACILITY): Payer: Medicare Other | Admitting: Internal Medicine

## 2015-11-29 ENCOUNTER — Encounter: Payer: Self-pay | Admitting: Internal Medicine

## 2015-11-29 DIAGNOSIS — D692 Other nonthrombocytopenic purpura: Secondary | ICD-10-CM | POA: Diagnosis not present

## 2015-11-29 DIAGNOSIS — R479 Unspecified speech disturbances: Secondary | ICD-10-CM

## 2015-11-29 DIAGNOSIS — S81802D Unspecified open wound, left lower leg, subsequent encounter: Secondary | ICD-10-CM | POA: Diagnosis not present

## 2015-11-29 HISTORY — DX: Unspecified speech disturbances: R47.9

## 2015-11-29 NOTE — Assessment & Plan Note (Signed)
Felt to be 2/2 infection ,now possibly small stroke although no other deficits;will have speech therapy do a swallowing eval

## 2015-11-29 NOTE — Assessment & Plan Note (Signed)
Bruising on arm appears to be senilr purpura; will monitor

## 2015-11-29 NOTE — Progress Notes (Signed)
MRN: [id number] Name: Mariah [name]  Sex: female Age: 82 y.o. DOB: 1919/03/11  [place] [facility]: [facility] Facility/Room: Level Of Care: SNF Provider: [name] D Emergency Contacts: Extended Emergency Contact Information Primary Emergency Contact: [name],[name] Address: [address]          [place], [place] [address] [name] of [place] Phone: [phone] Relation: Daughter  Code Status:   Allergies: Lyrica and Penicillins  Chief Complaint  Patient presents with  . Acute Visit    HPI: Patient is 82 y.o. female with chronic respiratory failure COPD, CHF , PAF who nursing asked me to see for a large bruise on her forearm.  Or something like Pt denies a fall, nursing denies knowledge, it looks like pt hit her arm on furniture.While there I noted pt's speech is still garbled, although her words make sense. Last week I had thought it 2/2 possible UTI.  Past Medical History  Diagnosis Date  . COPD (chronic obstructive pulmonary disease) ([place])   . Atrial fibrillation ([place])   . Hypertension   . CHF (congestive heart failure) [facility])     Past Surgical History  Procedure Laterality Date  . Hernia repair    . Femur im nail Left 09/09/2015    Procedure: INTRAMEDULLARY (IM) NAIL FEMORAL;  Surgeon: Rod Can, MD;  Location: [facility];  Service: Orthopedics;  Laterality: Left;      Medication List       This list is accurate as of: 11/29/15 10:18 PM.  Always use your most recent med list.               acetaminophen 500 MG tablet  Commonly known as:  TYLENOL  Take 1,000 mg by mouth 3 (three) times daily.     aspirin 325 MG tablet  Take 325 mg by mouth daily.     brimonidine 0.1 % Soln  Commonly known as:  ALPHAGAN P  Place 1 drop into both eyes 2 (two) times daily.     furosemide 40 MG tablet  Commonly known as:  LASIX  Take 20 mg by mouth daily.     gabapentin 100 MG capsule  Commonly known as:  NEURONTIN  Take 100 mg by mouth at bedtime.     HYDROcodone-acetaminophen 5-325 MG tablet  Commonly known as:  NORCO/VICODIN  Take 1 tablet by mouth every 6 (six) hours as needed for severe pain.     latanoprost 0.005 % ophthalmic solution  Commonly known as:  XALATAN  Place 1 drop into both eyes at bedtime.     Melatonin 1 MG Tabs  Take 1 tablet by mouth daily.     metoprolol succinate 50 MG 24 hr tablet  Commonly known as:  TOPROL-XL  Take 50 mg by mouth daily. Take with or immediately following a meal.     mirtazapine 15 MG tablet  Commonly known as:  REMERON  Take 15 mg by mouth at bedtime as needed (sleep).     spironolactone 12.5 mg Tabs tablet  Commonly known as:  ALDACTONE  Take 6.25 mg by mouth daily.     Vitamin D3 5000 units Caps  Take 1 capsule by mouth daily.        No orders of the defined types were placed in this encounter.     There is no immunization history on file for this patient.  Social History  Substance Use Topics  . Smoking status: Never Smoker   . Smokeless tobacco: Not on file  . Alcohol  Use: No    Review of Systems  DATA OBTAINED: from patient, nurse- as HPI; pt has no concerns at all GENERAL:  no fevers, fatigue, appetite changes SKIN: No itching, rash HEENT: No complaint RESPIRATORY: No cough, wheezing, SOB CARDIAC: No chest pain, palpitations, lower extremity edema  GI: No abdominal pain, No N/V/D or constipation, No heartburn or reflux  GU: No dysuria, frequency or urgency, or incontinence  MUSCULOSKELETAL: No unrelieved bone/joint pain NEUROLOGIC: No headache, dizziness  PSYCHIATRIC: No overt anxiety or sadness  Filed Vitals:   11/29/15 1347  BP: 138/70  Pulse: 62  Temp: 97.4 F (36.3 C)  Resp: 18    Physical Exam  GENERAL APPEARANCE: Alert, conversant, No acute distress  SKIN: bruise R forearm, no swelling , no TTP HEENT: Unremarkable RESPIRATORY: Breathing is even, unlabored. Lung sounds are clear   CARDIOVASCULAR: Heart RRR no murmurs, rubs or gallops. No  peripheral edema  GASTROINTESTINAL: Abdomen is soft, non-tender, not distended w/ normal bowel sounds.  GENITOURINARY: Bladder non tender, not distended  MUSCULOSKELETAL: No abnormal joints or musculature NEUROLOGIC: Cranial nerves 2-12 grossly intact.Speech a little difficult to understand but words ans sentences make sense make sense  Moves all extremities PSYCHIATRIC: Mood and affect appropriate to situation, no behavioral issues  Patient Active Problem List   Diagnosis Date Noted  . Speech abnormality 11/29/2015  . Senile purpura ([place]) 11/29/2015  . Hip pain, acute 11/06/2015  . Acute encephalopathy 11/06/2015  . Bilateral lower extremity edema 11/06/2015  . UTI (urinary tract infection) 11/06/2015  . Chronic respiratory failure with hypoxia ([place]) 11/06/2015  . Closed left hip fracture ([place]) 09/09/2015  . CHF (congestive heart failure) ([place]) 09/09/2015  . COPD (chronic obstructive pulmonary disease) ([place])   . Atrial fibrillation ([place])   . Hypertensive heart disease with CHF (congestive heart failure) ([place])   . Chronic bronchitis ([place])   . Essential hypertension   . Fall   . Closed pertrochanteric fracture (HCC)     CBC    Component Value Date/Time   WBC 9.0 09/13/2015 0448   RBC 2.89* 09/13/2015 0448   HGB 9.4* 09/13/2015 0448   HCT 28.7* 09/13/2015 0448   PLT 137* 09/13/2015 0448   MCV 99.3 09/13/2015 0448   LYMPHSABS 1.1 09/08/2015 2325   MONOABS 0.8 09/08/2015 2325   EOSABS 0.1 09/08/2015 2325   BASOSABS 0.0 09/08/2015 2325    CMP     Component Value Date/Time   NA 138 09/13/2015 0448   K 4.3 09/13/2015 0448   CL 107 09/13/2015 0448   CO2 24 09/13/2015 0448   GLUCOSE 101* 09/13/2015 0448   BUN 16 09/13/2015 0448   CREATININE 0.66 09/13/2015 0448   CALCIUM 8.7* 09/13/2015 0448   PROT 6.9 09/08/2015 2325   ALBUMIN 4.1 09/08/2015 2325   AST 26 09/08/2015 2325   ALT 11* 09/08/2015 2325   ALKPHOS 56 09/08/2015 2325   BILITOT 1.3* 09/08/2015 2325    GFRNONAA >60 09/13/2015 0448   GFRAA >60 09/13/2015 0448    Assessment and Plan  Speech abnormality Felt to be 2/2 infection ,now possibly small stroke although no other deficits;will have speech therapy do a swallowing eval  Senile purpura ([place]) Bruising on arm appears to be senilr purpura; will monitor    [name], MD

## 2015-12-07 ENCOUNTER — Non-Acute Institutional Stay (SKILLED_NURSING_FACILITY): Payer: Medicare Other | Admitting: Internal Medicine

## 2015-12-07 DIAGNOSIS — T17890A Other foreign object in other parts of respiratory tract causing asphyxiation, initial encounter: Secondary | ICD-10-CM | POA: Diagnosis not present

## 2015-12-07 DIAGNOSIS — I110 Hypertensive heart disease with heart failure: Secondary | ICD-10-CM | POA: Diagnosis not present

## 2015-12-07 DIAGNOSIS — T17920A Food in respiratory tract, part unspecified causing asphyxiation, initial encounter: Secondary | ICD-10-CM

## 2015-12-07 DIAGNOSIS — T17928A Food in respiratory tract, part unspecified causing other injury, initial encounter: Secondary | ICD-10-CM

## 2015-12-07 DIAGNOSIS — I480 Paroxysmal atrial fibrillation: Secondary | ICD-10-CM | POA: Diagnosis not present

## 2015-12-08 ENCOUNTER — Other Ambulatory Visit (HOSPITAL_COMMUNITY): Payer: Self-pay | Admitting: Internal Medicine

## 2015-12-08 DIAGNOSIS — R1310 Dysphagia, unspecified: Secondary | ICD-10-CM

## 2015-12-09 DIAGNOSIS — S81802D Unspecified open wound, left lower leg, subsequent encounter: Secondary | ICD-10-CM | POA: Diagnosis not present

## 2015-12-13 DIAGNOSIS — S81802D Unspecified open wound, left lower leg, subsequent encounter: Secondary | ICD-10-CM | POA: Diagnosis not present

## 2015-12-14 DIAGNOSIS — S72142D Displaced intertrochanteric fracture of left femur, subsequent encounter for closed fracture with routine healing: Secondary | ICD-10-CM | POA: Diagnosis not present

## 2015-12-14 DIAGNOSIS — Z4789 Encounter for other orthopedic aftercare: Secondary | ICD-10-CM | POA: Diagnosis not present

## 2015-12-17 ENCOUNTER — Encounter: Payer: Self-pay | Admitting: Internal Medicine

## 2015-12-17 DIAGNOSIS — T17920A Food in respiratory tract, part unspecified causing asphyxiation, initial encounter: Secondary | ICD-10-CM | POA: Insufficient documentation

## 2015-12-17 DIAGNOSIS — T17928A Food in respiratory tract, part unspecified causing other injury, initial encounter: Secondary | ICD-10-CM | POA: Insufficient documentation

## 2015-12-17 NOTE — Progress Notes (Signed)
MRN: [id number] Name: Mariah [name]  Sex: female Age: 82 y.o. DOB: Jul 28, 1919  [place] [facility]: [facility] Facility/Room: Level Of Care: SNF Provider: [name] D Emergency Contacts: Extended Emergency Contact Information Primary Emergency Contact: [name],[name] Address: [address]          [place],  [address] [name] of [place] Phone: [phone] Relation: Daughter  Code Status:   Allergies: Lyrica and Penicillins  Chief Complaint  Patient presents with  . Medical Management of Chronic Issues    HPI: Patient is 82 y.o. female with chronic respiratory failure COPD, CHF , PAF who was hospitalized at [facility] from 12/6-12 s/p fall and MS change. Work-up of fall, revealed loosening of components of L hip fx repair form 2 months ago but conservative care only was recommended. It was felt that confusion was 2/2 UTI, maybe an early PNA,, tx with IV rocephin. Pt's confusion improved but not totally resolved at D/c. Pt was admitted to SNF for generalized weakness and need for higher level of care. Pt is being seen today for f/u of swallowing issues and for routine issues of HTN and PAF.  Past Medical History  Diagnosis Date  . COPD (chronic obstructive pulmonary disease) ([place])   . Atrial fibrillation ([place])   . Hypertension   . CHF (congestive heart failure) [facility])     Past Surgical History  Procedure Laterality Date  . Hernia repair    . Femur im nail Left 09/09/2015    Procedure: INTRAMEDULLARY (IM) NAIL FEMORAL;  Surgeon: Rod Can, MD;  [facility];  Service: Orthopedics;  Laterality: Left;      Medication List       This list is accurate as of: 12/07/15 11:59 PM.  Always use your most recent med list.               acetaminophen 500 MG tablet  Commonly known as:  TYLENOL  Take 1,000 mg by mouth 3 (three) times daily.     aspirin 325 MG tablet  Take 325 mg by mouth daily.     brimonidine 0.1 % Soln  Commonly known as:  ALPHAGAN P  Place 1 drop  into both eyes 2 (two) times daily.     furosemide 40 MG tablet  Commonly known as:  LASIX  Take 20 mg by mouth daily.     gabapentin 100 MG capsule  Commonly known as:  NEURONTIN  Take 100 mg by mouth at bedtime.     HYDROcodone-acetaminophen 5-325 MG tablet  Commonly known as:  NORCO/VICODIN  Take 1 tablet by mouth every 6 (six) hours as needed for severe pain.     latanoprost 0.005 % ophthalmic solution  Commonly known as:  XALATAN  Place 1 drop into both eyes at bedtime.     Melatonin 1 MG Tabs  Take 1 tablet by mouth daily.     metoprolol succinate 50 MG 24 hr tablet  Commonly known as:  TOPROL-XL  Take 50 mg by mouth daily. Take with or immediately following a meal.     mirtazapine 15 MG tablet  Commonly known as:  REMERON  Take 15 mg by mouth at bedtime as needed (sleep).     spironolactone 12.5 mg Tabs tablet  Commonly known as:  ALDACTONE  Take 6.25 mg by mouth daily.     Vitamin D3 5000 units Caps  Take 1 capsule by mouth daily.        No orders of the defined types were placed  in this encounter.     There is no immunization history on file for this patient.  Social History  Substance Use Topics  . Smoking status: Never Smoker   . Smokeless tobacco: Not on file  . Alcohol Use: No    Review of Systems  DATA OBTAINED: from nursing - no concerns;  ST - as below GENERAL:  no fevers, fatigue, appetite changes SKIN: No itching, rash HEENT: No complaint RESPIRATORY: No cough, wheezing, SOB CARDIAC: No chest pain, palpitations, lower extremity edema  GI: No abdominal pain, No N/V/D or constipation,ST is reporting from swallowing eval that they do believe pt is aspirating GU: No dysuria, frequency or urgency, or incontinence  MUSCULOSKELETAL: No unrelieved bone/joint pain NEUROLOGIC: No headache, dizziness  PSYCHIATRIC: No overt anxiety or sadness  Filed Vitals:   12/17/15 1820  BP: 126/70  Pulse: 64  Temp: 97.8 F (36.6 C)  Resp: 18     Physical Exam  GENERAL APPEARANCE: Alert, conversant, No acute distress  SKIN: No diaphoresis rash, or wounds HEENT: Unremarkable RESPIRATORY: Breathing is even, unlabored. Lung sounds are clear   CARDIOVASCULAR: Heart RRR no murmurs, rubs or gallops. No peripheral edema  GASTROINTESTINAL: Abdomen is soft, non-tender, not distended w/ normal bowel sounds.  GENITOURINARY: Bladder non tender, not distended  MUSCULOSKELETAL: No abnormal joints or musculature NEUROLOGIC: Cranial nerves 2-12 grossly intact; speech may be a little clearer Moves all extremities; no change from prior PSYCHIATRIC: happy appearing , confusion, no behavioral issues  Patient Active Problem List   Diagnosis Date Noted  . Aspiration of food 12/17/2015  . Speech abnormality 11/29/2015  . Senile purpura ([place]) 11/29/2015  . Hip pain, acute 11/06/2015  . Acute encephalopathy 11/06/2015  . Bilateral lower extremity edema 11/06/2015  . UTI (urinary tract infection) 11/06/2015  . Chronic respiratory failure with hypoxia ([place]) 11/06/2015  . Closed left hip fracture ([place]) 09/09/2015  . CHF (congestive heart failure) ([place]) 09/09/2015  . COPD (chronic obstructive pulmonary disease) ([place])   . Atrial fibrillation ([place])   . Hypertensive heart disease with CHF (congestive heart failure) ([place])   . Chronic bronchitis ([place])   . Essential hypertension   . Fall   . Closed pertrochanteric fracture (HCC)     CBC    Component Value Date/Time   WBC 9.0 09/13/2015 0448   RBC 2.89* 09/13/2015 0448   HGB 9.4* 09/13/2015 0448   HCT 28.7* 09/13/2015 0448   PLT 137* 09/13/2015 0448   MCV 99.3 09/13/2015 0448   LYMPHSABS 1.1 09/08/2015 2325   MONOABS 0.8 09/08/2015 2325   EOSABS 0.1 09/08/2015 2325   BASOSABS 0.0 09/08/2015 2325    CMP     Component Value Date/Time   NA 138 09/13/2015 0448   K 4.3 09/13/2015 0448   CL 107 09/13/2015 0448   CO2 24 09/13/2015 0448   GLUCOSE 101* 09/13/2015 0448   BUN 16 09/13/2015  0448   CREATININE 0.66 09/13/2015 0448   CALCIUM 8.7* 09/13/2015 0448   PROT 6.9 09/08/2015 2325   ALBUMIN 4.1 09/08/2015 2325   AST 26 09/08/2015 2325   ALT 11* 09/08/2015 2325   ALKPHOS 56 09/08/2015 2325   BILITOT 1.3* 09/08/2015 2325   GFRNONAA >60 09/13/2015 0448   GFRAA >60 09/13/2015 0448    Assessment and Plan  Aspiration of food Based on ST findings I hve ordered a modified swallowing test at hospital and have started pt on Protonix 40 mg daily  Hypertensive heart disease with CHF (congestive heart failure) (  HCC) BP continues to be well controlled on toprol XL 50 mg daily, lasix 30 and spironolactone 6.25 mg, both daily  Atrial fibrillation (HCC) Continues controlled on metoprolol XL 50 mg daily and ASA as prophylaxis    [name], MD

## 2015-12-17 NOTE — Assessment & Plan Note (Signed)
BP continues to be well controlled on toprol XL 50 mg daily, lasix 30 and spironolactone 6.25 mg, both daily

## 2015-12-17 NOTE — Assessment & Plan Note (Signed)
Based on ST findings I hve ordered a modified swallowing test at hospital and have started pt on Protonix 40 mg daily

## 2015-12-17 NOTE — Assessment & Plan Note (Signed)
Continues controlled on metoprolol XL 50 mg daily and ASA as prophylaxis

## 2015-12-20 ENCOUNTER — Ambulatory Visit (HOSPITAL_COMMUNITY)
Admission: RE | Admit: 2015-12-20 | Discharge: 2015-12-20 | Disposition: A | Discharge status: Home / Self Care | Payer: PRIVATE HEALTH INSURANCE | Source: Ambulatory Visit | Attending: Internal Medicine | Admitting: Internal Medicine

## 2015-12-20 DIAGNOSIS — R1310 Dysphagia, unspecified: Secondary | ICD-10-CM | POA: Insufficient documentation

## 2015-12-20 DIAGNOSIS — R933 Abnormal findings on diagnostic imaging of other parts of digestive tract: Secondary | ICD-10-CM | POA: Diagnosis not present

## 2015-12-20 DIAGNOSIS — S81802D Unspecified open wound, left lower leg, subsequent encounter: Secondary | ICD-10-CM | POA: Diagnosis not present

## 2015-12-20 DIAGNOSIS — T17300D Unspecified foreign body in larynx causing asphyxiation, subsequent encounter: Secondary | ICD-10-CM | POA: Diagnosis not present

## 2015-12-22 LAB — CBC AND DIFFERENTIAL
HCT: 40 % (ref 36–46)
HEMOGLOBIN: 12.9 g/dL (ref 12.0–16.0)
Platelets: 127 10*3/uL — AB (ref 150–399)
WBC: 4 10^3/mL

## 2015-12-22 LAB — TSH: TSH: 4.26 u[IU]/mL (ref 0.41–5.90)

## 2015-12-26 ENCOUNTER — Encounter: Payer: Self-pay | Admitting: Internal Medicine

## 2015-12-26 ENCOUNTER — Non-Acute Institutional Stay (SKILLED_NURSING_FACILITY): Payer: Medicare Other | Admitting: Internal Medicine

## 2015-12-26 DIAGNOSIS — J9611 Chronic respiratory failure with hypoxia: Secondary | ICD-10-CM

## 2015-12-26 DIAGNOSIS — I110 Hypertensive heart disease with heart failure: Secondary | ICD-10-CM

## 2015-12-26 DIAGNOSIS — D696 Thrombocytopenia, unspecified: Secondary | ICD-10-CM | POA: Diagnosis not present

## 2015-12-26 DIAGNOSIS — I4891 Unspecified atrial fibrillation: Secondary | ICD-10-CM

## 2015-12-26 NOTE — Progress Notes (Signed)
Patient ID: Mariah [name], female   DOB: 1919/04/21, 82 y.o.   MRN: [id number] MRN: [id number] Name: Mariah [name]  Sex: female Age: 82 y.o. DOB: 1919/07/25  [place] [facility] Facility/Room: Level Of Care: SNF Provider: Wille Celeste Emergency Contacts: Extended Emergency Contact Information Primary Emergency Contact: [name],[name] Address: [address]          [place], [place] [address] [name] of [employer] [place] Phone: [phone] Relation: Daughter  Code Status:   Allergies: Lyrica and Penicillins  Chief Complaint  Patient presents with  . Acute Visit   follow-up shortness of breath  HPI: Patient is 82 y.o. female with chronic respiratory failure COPD, CHF , PAF who was hospitalized at [facility] from 12/6-12 s/p fall and MS change. Work-up of fall, revealed loosening of components of L hip fx repair form 2 months ago but conservative care only was recommended. It was felt that confusion was 2/2 UTI, maybe an early PNA,, tx with IV rocephin. Pt's confusion improved but not totally resolved at D/c. Pt was admitted to SNF for generalized weakness and need for higher level of care. Pt is being seen today for complaints of shortness of breath earlier today.  Patient apparently actually called family members complaining of this-however while signs and O2 saturations were satisfactoryit appears possibly her oxygen tubing had become entangled this made her anxious prompting calls to  the family-.  Currently she is resting in bed comfortably she is alert and conversant-  Past Medical History  Diagnosis Date  . COPD (chronic obstructive pulmonary disease) ([place])   . Atrial fibrillation ([place])   . Hypertension   . CHF (congestive heart failure) [facility])     Past Surgical History  Procedure Laterality Date  . Hernia repair    . Femur im nail Left 09/09/2015    Procedure: INTRAMEDULLARY (IM) NAIL FEMORAL;  Surgeon: Rod Can, MD;  [facility]: [facility];  Service: Orthopedics;  Laterality: Left;       Medication List       This list is accurate as of: 12/26/15 11:59 PM.  Always use your most recent med list.               acetaminophen 500 MG tablet  Commonly known as:  TYLENOL  Take 1,000 mg by mouth 3 (three) times daily.     aspirin 325 MG tablet  Take 325 mg by mouth daily.     brimonidine 0.1 % Soln  Commonly known as:  ALPHAGAN P  Place 1 drop into both eyes 2 (two) times daily.     furosemide 40 MG tablet  Commonly known as:  LASIX  Take 20 mg by mouth daily.     gabapentin 100 MG capsule  Commonly known as:  NEURONTIN  Take 100 mg by mouth at bedtime.     HYDROcodone-acetaminophen 5-325 MG tablet  Commonly known as:  NORCO/VICODIN  Take 1 tablet by mouth every 6 (six) hours as needed for severe pain.     latanoprost 0.005 % ophthalmic solution  Commonly known as:  XALATAN  Place 1 drop into both eyes at bedtime.     Melatonin 1 MG Tabs  Take 1 tablet by mouth daily.     metoprolol succinate 50 MG 24 hr tablet  Commonly known as:  TOPROL-XL  Take 50 mg by mouth daily. Take with or immediately following a meal.     mirtazapine 15 MG tablet  Commonly known as:  REMERON  Take 15 mg by  mouth at bedtime as needed (sleep).     spironolactone 12.5 mg Tabs tablet  Commonly known as:  ALDACTONE  Take 6.25 mg by mouth daily.     Vitamin D3 5000 units Caps  Take 1 capsule by mouth daily.        No orders of the defined types were placed in this encounter.     There is no immunization history on file for this patient.  Social History  Substance Use Topics  . Smoking status: Never Smoker   . Smokeless tobacco: Not on file  . Alcohol Use: No    Review of Systems  DATA OBTAINED: from nursing - family and patient GENERAL:  no fevers, fatigue, appetite changes SKIN: No itching, rash HEENT: No complaint RESPIRATORY: No cough, wheezing, SOB-shortness of breath earlier today as noted above CARDIAC: No chest pain, palpitations  baseline, lower  extremity edema  GI: No abdominal pain, No N/V/D or constipation,ST is reporting from swallowing eval that they do believe pt is aspirating GU: No dysuria, frequency or urgency, or incontinence  MUSCULOSKELETAL: No unrelieved bone/joint pain NEUROLOGIC: No headache, dizziness  PSYCHIATRIC: No overt anxiety or sadness at this time apparently had increased anxiety earlier today is normal above  Filed Vitals:   12/26/15 2147  BP: 146/73  Pulse: 82  Temp: 98.1 F (36.7 C)  Resp: 18   O2 saturation 95% on chronic oxygen Physical Exam  GENERAL APPEARANCE: Alert, conversant, No acute distress lying comfortably in bed SKIN: No diaphoresis rash, or wounds HEENT: Unremarkable RESPIRATORY: Breathing is even, unlabored. Lung sounds are clear- shallow  CARDIOVASCULAR: Heart RRR no murmurs, rubs or gallops. Legs are currently wrapped bilaterally appears to have 1-2 plus lower extremity edema which apparently is baseline GASTROINTESTINAL: Abdomen is soft, non-tender, not distended w/ normal bowel sounds .  GENITOURINARY: Bladder non tender, not distended  MUSCULOSKELETAL: No abnormal joints or musculature NEUROLOGIC: Cranial nerves 2-12 grossly intact No lateralizing findings r PSYCHIATRIC: Pleasant talkative--at  times confusion, no behavioral issues  Patient Active Problem List   Diagnosis Date Noted  . Aspiration of food 12/17/2015  . Speech abnormality 11/29/2015  . Senile purpura ([place]) 11/29/2015  . Hip pain, acute 11/06/2015  . Acute encephalopathy 11/06/2015  . Bilateral lower extremity edema 11/06/2015  . UTI (urinary tract infection) 11/06/2015  . Chronic respiratory failure with hypoxia ([place]) 11/06/2015  . Closed left hip fracture ([place]) 09/09/2015  . CHF (congestive heart failure) ([place]) 09/09/2015  . COPD (chronic obstructive pulmonary disease) ([place])   . Atrial fibrillation ([place])   . Hypertensive heart disease with CHF (congestive heart failure) ([place])   . Chronic bronchitis  ([place])   . Essential hypertension   . Fall   . Closed pertrochanteric fracture ([place])    Labs.  Generally 6 2017.  Sodium 141 potassium 4.0 BUN 11 creatinine 0.6 CO2 33.  WBC 5.1 hemoglobin 13.3 platelets 114 CBC    Component Value Date/Time   WBC 9.0 09/13/2015 0448   RBC 2.89* 09/13/2015 0448   HGB 9.4* 09/13/2015 0448   HCT 28.7* 09/13/2015 0448   PLT 137* 09/13/2015 0448   MCV 99.3 09/13/2015 0448   LYMPHSABS 1.1 09/08/2015 2325   MONOABS 0.8 09/08/2015 2325   EOSABS 0.1 09/08/2015 2325   BASOSABS 0.0 09/08/2015 2325    CMP     Component Value Date/Time   NA 138 09/13/2015 0448   K 4.3 09/13/2015 0448   CL 107 09/13/2015 0448   CO2 24 09/13/2015 0448  GLUCOSE 101* 09/13/2015 0448   BUN 16 09/13/2015 0448   CREATININE 0.66 09/13/2015 0448   CALCIUM 8.7* 09/13/2015 0448   PROT 6.9 09/08/2015 2325   ALBUMIN 4.1 09/08/2015 2325   AST 26 09/08/2015 2325   ALT 11* 09/08/2015 2325   ALKPHOS 56 09/08/2015 2325   BILITOT 1.3* 09/08/2015 2325   GFRNONAA >60 09/13/2015 0448   GFRAA >60 09/13/2015 0448    Assessment and Plan  #1-shortness of breath-this appears to be transitory I suspect probably from anxiety when her to beam became entangled-vital signs O2 stats appear to be stable at this point continue to monitor.  #2 history CHF-she is on Lasix 20 mg as well as spironolactone 6.25 a day as well as metoprolol this appears relatively stable. Will update a metabolic panel   #3 atrial fibrillation this appears rate controlled she also metoprolol prophylaxis with aspirin she is not on anything else secondary to history of falls  #4 thrombocytopenia appears platelets 114,000 on most recent lab in January will update this she does not appear to show signs of any increased bruising or bleeding.  #5 history of hypertensive heart disease with CHF-again she continues on metoprolol as well as Lasix and spironolactone--I have reviewed her blood pressures somewhat elevated  systolically today at 0000000 [place] previous readings however 136/72-139/77 at this point will monitor again she is on metoprolol.  #6 history of acute encephalopathy appears this has improved she was pleasant and conversant today although apparently there are periods of confusion-in the hospital encephalopathy was thought secondary to UTI and possible pneumonia she was treated with an antibiotic.  History of acute hip pain with history of fall and recent ORIF-x-rays and hospital did show increase distraction of the fracture fragment of the left hip and loosening of distal fixation screw in the medullary rod-orthopedics was consulted and recommended no intervention with minimal weightbearing she is getting supportive care she is on Norco as needed for pain she is not really complaining of pain this evening.  #8-history COPD as noted above she is oxygen dependent this appears to be stable suspect at times there is an anxiety component to her shortness of breath.   History of insomnia she is melatonin apparently is tolerating this  relatively well.  [id number] note greater than 35 minutes spent assessing patient-discussing her status with her family at [place] her chart- coordinating and formulating a plan of care for numerous diagnoses     Janiel Crisostomo C,

## 2015-12-27 ENCOUNTER — Non-Acute Institutional Stay (SKILLED_NURSING_FACILITY): Payer: Medicare Other | Admitting: Internal Medicine

## 2015-12-27 DIAGNOSIS — S81802D Unspecified open wound, left lower leg, subsequent encounter: Secondary | ICD-10-CM | POA: Diagnosis not present

## 2015-12-27 DIAGNOSIS — Z7189 Other specified counseling: Secondary | ICD-10-CM | POA: Diagnosis not present

## 2015-12-27 DIAGNOSIS — F0390 Unspecified dementia without behavioral disturbance: Secondary | ICD-10-CM

## 2016-01-01 ENCOUNTER — Encounter: Payer: Self-pay | Admitting: Internal Medicine

## 2016-01-01 DIAGNOSIS — Z7189 Other specified counseling: Secondary | ICD-10-CM | POA: Insufficient documentation

## 2016-01-01 DIAGNOSIS — F0390 Unspecified dementia without behavioral disturbance: Secondary | ICD-10-CM | POA: Insufficient documentation

## 2016-01-01 HISTORY — DX: Unspecified dementia, unspecified severity, without behavioral disturbance, psychotic disturbance, mood disturbance, and anxiety: F03.90

## 2016-01-01 NOTE — Progress Notes (Signed)
MRN: [id number] Name: Mariah [name]  Sex: female Age: 82 y.o. DOB: May 28, 1919  [place] [facility]: [facility] Facility/Room:213 Level Of Care: SNF Provider: [name] D Emergency Contacts: Extended Emergency Contact Information Primary Emergency Contact: [name],[name] Address: [address]          [place], [place] [address] [name] of [place] Phone: [phone] Relation: Daughter  Code Status:   Allergies: Lyrica and Penicillins  Chief Complaint  Patient presents with  . family conference with pt present    HPI: Patient is 82 y.o. female who I am seeing today because her daughter and son-in-law are here and they haven't seen the doctor in a while. Once I was with patient and family I realized they wished to speak of end of life matters and so we did.  Past Medical History  Diagnosis Date  . COPD (chronic obstructive pulmonary disease) ([place])   . Atrial fibrillation ([place])   . Hypertension   . CHF (congestive heart failure) [facility])     Past Surgical History  Procedure Laterality Date  . Hernia repair    . Femur im nail Left 09/09/2015    Procedure: INTRAMEDULLARY (IM) NAIL FEMORAL;  Surgeon: Rod Can, MD;  Location: [facility];  Service: Orthopedics;  Laterality: Left;      Medication List       This list is accurate as of: 12/27/15 11:59 PM.  Always use your most recent med list.               acetaminophen 500 MG tablet  Commonly known as:  TYLENOL  Take 1,000 mg by mouth 3 (three) times daily.     aspirin 325 MG tablet  Take 325 mg by mouth daily.     brimonidine 0.1 % Soln  Commonly known as:  ALPHAGAN P  Place 1 drop into both eyes 2 (two) times daily.     furosemide 40 MG tablet  Commonly known as:  LASIX  Take 20 mg by mouth daily.     gabapentin 100 MG capsule  Commonly known as:  NEURONTIN  Take 100 mg by mouth at bedtime.     HYDROcodone-acetaminophen 5-325 MG tablet  Commonly known as:  NORCO/VICODIN  Take 1 tablet by mouth every 6  (six) hours as needed for severe pain.     latanoprost 0.005 % ophthalmic solution  Commonly known as:  XALATAN  Place 1 drop into both eyes at bedtime.     Melatonin 1 MG Tabs  Take 1 tablet by mouth daily.     metoprolol succinate 50 MG 24 hr tablet  Commonly known as:  TOPROL-XL  Take 50 mg by mouth daily. Take with or immediately following a meal.     mirtazapine 15 MG tablet  Commonly known as:  REMERON  Take 15 mg by mouth at bedtime as needed (sleep).     spironolactone 12.5 mg Tabs tablet  Commonly known as:  ALDACTONE  Take 6.25 mg by mouth daily.     Vitamin D3 5000 units Caps  Take 1 capsule by mouth daily.        No orders of the defined types were placed in this encounter.     There is no immunization history on file for this patient.  Social History  Substance Use Topics  . Smoking status: Never Smoker   . Smokeless tobacco: Not on file  . Alcohol Use: No    Review of Systems  UTO from pt 2/2 dementia  Filed Vitals:   01/01/16 2034  BP: 130/85  Pulse: 84  Temp: 97.3 F (36.3 C)  Resp: 18    Physical Exam  GENERAL APPEARANCE: Alert, conversant, No acute distress  SKIN: No diaphoresis rash HEENT: Unremarkable RESPIRATORY: Breathing is even, unlabored. Lung sounds are clear   CARDIOVASCULAR: Heart RRR no murmurs, rubs or gallops. No peripheral edema  GASTROINTESTINAL: Abdomen is soft, non-tender, not distended w/ normal bowel sounds.  GENITOURINARY: Bladder non tender, not distended  MUSCULOSKELETAL: No abnormal joints or musculature NEUROLOGIC: Cranial nerves 2-12 grossly intact. Moves all extremities PSYCHIATRIC: dementia; pt is speaking but making no sense, no behavioral issues  Patient Active Problem List   Diagnosis Date Noted  . Dementia without behavioral disturbance 01/01/2016  . Encounter for family conference with patient present 01/01/2016  . Aspiration of food 12/17/2015  . Speech abnormality 11/29/2015  . Senile purpura  ([place]) 11/29/2015  . Hip pain, acute 11/06/2015  . Acute encephalopathy 11/06/2015  . Bilateral lower extremity edema 11/06/2015  . UTI (urinary tract infection) 11/06/2015  . Chronic respiratory failure with hypoxia ([place]) 11/06/2015  . Closed left hip fracture ([place]) 09/09/2015  . CHF (congestive heart failure) ([place]) 09/09/2015  . COPD (chronic obstructive pulmonary disease) ([place])   . Atrial fibrillation ([place])   . Hypertensive heart disease with CHF (congestive heart failure) ([place])   . Chronic bronchitis ([place])   . Essential hypertension   . Fall   . Closed pertrochanteric fracture (H[facility])     CBC    Component Value Date/Time   WBC 9.0 09/13/2015 0448   RBC 2.89* 09/13/2015 0448   HGB 9.4* 09/13/2015 0448   HCT 28.7* 09/13/2015 0448   PLT 137* 09/13/2015 0448   MCV 99.3 09/13/2015 0448   LYMPHSABS 1.1 09/08/2015 2325   MONOABS 0.8 09/08/2015 2325   EOSABS 0.1 09/08/2015 2325   BASOSABS 0.0 09/08/2015 2325    CMP     Component Value Date/Time   NA 138 09/13/2015 0448   K 4.3 09/13/2015 0448   CL 107 09/13/2015 0448   CO2 24 09/13/2015 0448   GLUCOSE 101* 09/13/2015 0448   BUN 16 09/13/2015 0448   CREATININE 0.66 09/13/2015 0448   CALCIUM 8.7* 09/13/2015 0448   PROT 6.9 09/08/2015 2325   ALBUMIN 4.1 09/08/2015 2325   AST 26 09/08/2015 2325   ALT 11* 09/08/2015 2325   ALKPHOS 56 09/08/2015 2325   BILITOT 1.3* 09/08/2015 2325   GFRNONAA >60 09/13/2015 0448   GFRAA >60 09/13/2015 0448    Assessment and Plan  Dementia without behavioral disturbance Pt has been in a slow but steady decline. Family wishes to consult Hospice.  Encounter for family conference with patient present I spoke with the family for a long time. This daughter is POA but everyone else in the family always questions her and what she is doing. Those family members don't come to see pt. This puts the daughter in conflict. She knows her mother's wishes.She definitely doesn't want a feeding tube,  doesn't think she wants hospitalizations. We discussed everything on the MOST form and i told family Hospice will speak with them about this also. It was a good meeting.   Time spent > 35 min;/g  [name], MD

## 2016-01-01 NOTE — Assessment & Plan Note (Signed)
Pt has been in a slow but steady decline. Family wishes to consult Hospice.

## 2016-01-01 NOTE — Assessment & Plan Note (Signed)
I spoke with the family for a long time. This daughter is POA but everyone else in the family always questions her and what she is doing. Those family members don't come to see pt. This puts the daughter in conflict. She knows her mother's wishes.She definitely doesn't want a feeding tube, doesn't think she wants hospitalizations. We discussed everything on the MOST form and i told family Hospice will speak with them about this also. It was a good meeting.

## 2016-01-10 DIAGNOSIS — R609 Edema, unspecified: Secondary | ICD-10-CM | POA: Diagnosis not present

## 2016-01-10 DIAGNOSIS — I10 Essential (primary) hypertension: Secondary | ICD-10-CM | POA: Diagnosis not present

## 2016-01-10 DIAGNOSIS — I509 Heart failure, unspecified: Secondary | ICD-10-CM | POA: Diagnosis not present

## 2016-01-10 DIAGNOSIS — F339 Major depressive disorder, recurrent, unspecified: Secondary | ICD-10-CM | POA: Diagnosis not present

## 2016-01-10 DIAGNOSIS — H409 Unspecified glaucoma: Secondary | ICD-10-CM | POA: Diagnosis not present

## 2016-01-10 DIAGNOSIS — I519 Heart disease, unspecified: Secondary | ICD-10-CM | POA: Diagnosis not present

## 2016-01-10 DIAGNOSIS — K219 Gastro-esophageal reflux disease without esophagitis: Secondary | ICD-10-CM | POA: Diagnosis not present

## 2016-01-10 DIAGNOSIS — N390 Urinary tract infection, site not specified: Secondary | ICD-10-CM | POA: Diagnosis not present

## 2016-01-10 DIAGNOSIS — I4891 Unspecified atrial fibrillation: Secondary | ICD-10-CM | POA: Diagnosis not present

## 2016-01-10 DIAGNOSIS — H11149 Conjunctival xerosis, unspecified, unspecified eye: Secondary | ICD-10-CM | POA: Diagnosis not present

## 2016-01-10 DIAGNOSIS — J449 Chronic obstructive pulmonary disease, unspecified: Secondary | ICD-10-CM | POA: Diagnosis not present

## 2016-01-10 DIAGNOSIS — R0902 Hypoxemia: Secondary | ICD-10-CM | POA: Diagnosis not present

## 2016-01-10 DIAGNOSIS — S81802D Unspecified open wound, left lower leg, subsequent encounter: Secondary | ICD-10-CM | POA: Diagnosis not present

## 2016-01-10 DIAGNOSIS — E559 Vitamin D deficiency, unspecified: Secondary | ICD-10-CM | POA: Diagnosis not present

## 2016-01-10 DIAGNOSIS — I739 Peripheral vascular disease, unspecified: Secondary | ICD-10-CM | POA: Diagnosis not present

## 2016-01-11 DIAGNOSIS — I509 Heart failure, unspecified: Secondary | ICD-10-CM | POA: Diagnosis not present

## 2016-01-11 DIAGNOSIS — I739 Peripheral vascular disease, unspecified: Secondary | ICD-10-CM | POA: Diagnosis not present

## 2016-01-11 DIAGNOSIS — I4891 Unspecified atrial fibrillation: Secondary | ICD-10-CM | POA: Diagnosis not present

## 2016-01-11 DIAGNOSIS — I10 Essential (primary) hypertension: Secondary | ICD-10-CM | POA: Diagnosis not present

## 2016-01-11 DIAGNOSIS — R609 Edema, unspecified: Secondary | ICD-10-CM | POA: Diagnosis not present

## 2016-01-11 DIAGNOSIS — I519 Heart disease, unspecified: Secondary | ICD-10-CM | POA: Diagnosis not present

## 2016-01-12 ENCOUNTER — Non-Acute Institutional Stay (SKILLED_NURSING_FACILITY): Payer: Medicare Other | Admitting: Internal Medicine

## 2016-01-12 DIAGNOSIS — I4891 Unspecified atrial fibrillation: Secondary | ICD-10-CM | POA: Diagnosis not present

## 2016-01-12 DIAGNOSIS — M25559 Pain in unspecified hip: Secondary | ICD-10-CM | POA: Diagnosis not present

## 2016-01-12 DIAGNOSIS — I509 Heart failure, unspecified: Secondary | ICD-10-CM

## 2016-01-12 DIAGNOSIS — R0989 Other specified symptoms and signs involving the circulatory and respiratory systems: Secondary | ICD-10-CM | POA: Diagnosis not present

## 2016-01-12 DIAGNOSIS — R062 Wheezing: Secondary | ICD-10-CM

## 2016-01-12 DIAGNOSIS — J441 Chronic obstructive pulmonary disease with (acute) exacerbation: Secondary | ICD-10-CM | POA: Diagnosis not present

## 2016-01-12 DIAGNOSIS — I10 Essential (primary) hypertension: Secondary | ICD-10-CM | POA: Diagnosis not present

## 2016-01-12 DIAGNOSIS — I519 Heart disease, unspecified: Secondary | ICD-10-CM | POA: Diagnosis not present

## 2016-01-12 DIAGNOSIS — I739 Peripheral vascular disease, unspecified: Secondary | ICD-10-CM | POA: Diagnosis not present

## 2016-01-12 DIAGNOSIS — R05 Cough: Secondary | ICD-10-CM | POA: Diagnosis not present

## 2016-01-12 DIAGNOSIS — R609 Edema, unspecified: Secondary | ICD-10-CM | POA: Diagnosis not present

## 2016-01-12 NOTE — Progress Notes (Signed)
Patient ID: Mariah [name], female   DOB: 1919/01/12, 82 y.o.   MRN: [id number]  MRN: [id number] Name: Mariah [name]  Sex: female Age: 82 y.o. DOB: [birth year]  [place] [facility] Facility/Room: Level Of Care: SNF Provider: Wille Celeste Emergency Contacts: Extended Emergency Contact Information Primary Emergency Contact: [name],[name] Address: [address]          [place], [place] [address] [name] of [place] Phone: [phone] Relation: Daughter  Code Status:   Allergies: Lyrica and Penicillins  Chief Complaint  Patient presents with  . Acute Visit   secondary to wheezing-also pain management  HPI: Patient is 82 y.o. female with chronic respiratory failure COPD, CHF , PAF who was hospitalized at [facility] from 12/6-12 s/p fall and MS change. Work-up of fall, revealed loosening of components of L hip fx repair form 2 months ago but conservative care only was recommended. It was felt that confusion was 2/2 UTI, maybe an early PNA,, tx with IV rocephin. Pt's confusion improved but not totally resolved at D/c. Pt was admitted to SNF for generalized weakness and need for higher level of care. Pt is being seen today for complaints of wheezing-also pain management-family is concerned that patient needs routine pain medication-she is on Norco 05-33milligrams every 4 hours when necessary.  . Patient is now under hospice care with wishes for conservative treatment. An x-ray has been obtained of the chest which shows bilateral pleural effusions----however   basilar pneumonia or atelectasis not excluded.  She does have a history CHF she is on Lasix 20 mg a day and Aldactone 6.25 mg daily.  She does appear to have somewhat increased edema per nursing staff and family. \  She also has a history of COPD and is oxygen dependent   -  Past Medical History  Diagnosis Date  . COPD (chronic obstructive pulmonary disease) ([place])   . Atrial fibrillation ([place])   . Hypertension   . CHF  (congestive heart failure) [facility])     Past Surgical History  Procedure Laterality Date  . Hernia repair    . Femur im nail Left 09/09/2015    Procedure: INTRAMEDULLARY (IM) NAIL FEMORAL;  Surgeon: Rod Can, MD;  [facility];  Service: Orthopedics;  Laterality: Left;      Medication List       This list is accurate as of: 01/12/16 11:59 PM.  Always use your most recent med list.               acetaminophen 500 MG tablet  Commonly known as:  TYLENOL  Take 1,000 mg by mouth 3 (three) times daily.     aspirin 325 MG tablet  Take 325 mg by mouth daily.     brimonidine 0.1 % Soln  Commonly known as:  ALPHAGAN P  Place 1 drop into both eyes 2 (two) times daily.     furosemide 40 MG tablet  Commonly known as:  LASIX  Take 40 mg by mouth daily.     gabapentin 100 MG capsule  Commonly known as:  NEURONTIN  Take 100 mg by mouth at bedtime.     HYDROcodone-acetaminophen 5-325 MG tablet  Commonly known as:  NORCO/VICODIN  Take 1 tablet by mouth every 6 (six) hours as needed for severe pain.     ipratropium 0.02 % nebulizer solution  Commonly known as:  ATROVENT  Take 0.5 mg by nebulization every 6 (six) hours as needed for wheezing or shortness of breath.  latanoprost 0.005 % ophthalmic solution  Commonly known as:  XALATAN  Place 1 drop into both eyes at bedtime.     Melatonin 1 MG Tabs  Take 1 tablet by mouth daily.     metoprolol succinate 50 MG 24 hr tablet  Commonly known as:  TOPROL-XL  Take 50 mg by mouth daily. Take with or immediately following a meal.     mirtazapine 15 MG tablet  Commonly known as:  REMERON  Take 15 mg by mouth at bedtime as needed (sleep).     potassium chloride 10 MEQ CR capsule  Commonly known as:  MICRO-K  Take 10 mEq by mouth 2 (two) times daily.     spironolactone 12.5 mg Tabs tablet  Commonly known as:  ALDACTONE  Take 6.25 mg by mouth daily.     Vitamin D3 5000 units Caps  Take 1 capsule by mouth daily.         Meds ordered this encounter  Medications  . potassium chloride (MICRO-K) 10 MEQ CR capsule    Sig: Take 10 mEq by mouth 2 (two) times daily.  Marland Kitchen ipratropium (ATROVENT) 0.02 % nebulizer solution    Sig: Take 0.5 mg by nebulization every 6 (six) hours as needed for wheezing or shortness of breath.     There is no immunization history on file for this patient.  Social History  Substance Use Topics  . Smoking status: Never Smoker   . Smokeless tobacco: Not on file  . Alcohol Use: No    Review of Systems  DATA OBTAINED: from nursing - family and patient GENERAL:  no fevers, fatigue, appetite changes SKIN: No itching, rash HEENT: No complaint RESPIRATORY: As wheezing and cough and some shortness of breath CARDIAC: No chest pain, palpitations  mildly increased lower extremity edema  GI: No abdominal pain, No N/V/D or constipation, GU: No dysuria, frequency or urgency, or incontinence  MUSCULOSKELETAL as complain of some hip and generalized pain NEUROLOGIC: No headache, dizziness  PSYCHIATRIC: No overt anxiety or sadness at this time apparently had increased anxiety earlier today is normal above   Physical Exam She is afebrile pulse 88 respirations 22 blood pressure is pending  GENERAL APPEARANCE: Alert, conversant, No acute distress lying comfortably in bed but somewhat anxious SKIN: No diaphoresis rash, or wounds HEENT: Oropharynx is clear mucous membranes appear fairly moist RESPIRATORY: No labored breathing but diffuse congestion  CARDIOVASCULAR: Heart RRR no murmurs, rubs or gallops.  2+ lower extremity edema bilaterally apparently somewhat increased from baseline GASTROINTESTINAL: Abdomen is soft, non-tender, not distended w/ normal bowel sounds .  GENITOURINARY: Bladder non tender, not distended  MUSCULOSKELETAL: No abnormal joints or musculature in all frailty can move all extremities 4 some pain with movement of her lower extremities which is not acutely  new NEUROLOGIC: Cranial nerves 2-12 grossly intact No lateralizing findings r PSYCHIATRIC: Pleasant talkative--at  times confusion, no behavioral issues although she does have anxiety at times  Patient Active Problem List   Diagnosis Date Noted  . Dementia without behavioral disturbance 01/01/2016  . Encounter for family conference with patient present 01/01/2016  . Aspiration of food 12/17/2015  . Speech abnormality 11/29/2015  . Senile purpura ([place]) 11/29/2015  . Hip pain, acute 11/06/2015  . Acute encephalopathy 11/06/2015  . Bilateral lower extremity edema 11/06/2015  . UTI (urinary tract infection) 11/06/2015  . Chronic respiratory failure with hypoxia ([place]) 11/06/2015  . Closed left hip fracture ([place]) 09/09/2015  . CHF (congestive heart failure) ([place]) 09/09/2015  .  COPD (chronic obstructive pulmonary disease) ([place])   . Atrial fibrillation ([place])   . Hypertensive heart disease with CHF (congestive heart failure) ([place])   . Chronic bronchitis ([place])   . Essential hypertension   . Fall   . Closed pertrochanteric fracture ([place])    Labs.  Nov 25 2015.  Sodium 141 potassium 4.0 BUN 11 creatinine 0.6 CO2 33.  WBC 5.1 hemoglobin 13.3 platelets 114 CBC    Component Value Date/Time   WBC 9.0 09/13/2015 0448   RBC 2.89* 09/13/2015 0448   HGB 9.4* 09/13/2015 0448   HCT 28.7* 09/13/2015 0448   PLT 137* 09/13/2015 0448   MCV 99.3 09/13/2015 0448   LYMPHSABS 1.1 09/08/2015 2325   MONOABS 0.8 09/08/2015 2325   EOSABS 0.1 09/08/2015 2325   BASOSABS 0.0 09/08/2015 2325    CMP     Component Value Date/Time   NA 138 09/13/2015 0448   K 4.3 09/13/2015 0448   CL 107 09/13/2015 0448   CO2 24 09/13/2015 0448   GLUCOSE 101* 09/13/2015 0448   BUN 16 09/13/2015 0448   CREATININE 0.66 09/13/2015 0448   CALCIUM 8.7* 09/13/2015 0448   PROT 6.9 09/08/2015 2325   ALBUMIN 4.1 09/08/2015 2325   AST 26 09/08/2015 2325   ALT 11* 09/08/2015 2325   ALKPHOS 56 09/08/2015 2325   BILITOT  1.3* 09/08/2015 2325   GFRNONAA >60 09/13/2015 0448   GFRAA >60 09/13/2015 0448    Assessment and Plan  History of wheezing shortness of breath cough-with history of COPD as well as CHF.  Patient is on Lasix 20 mg a day and spironolactone 6.25 mg a day-I did have an extensive discussion with family as well as with hospice nurse and social worker in the room-family again reiterated desires for fairly minimal interventions-however they do feel she would benefit from increase Lasix we will increase this to 40 mg a day.  Also will supplement with low-dose potassium 10 mEq a day.  For the cough will start Mucinex 600 mg twice a day-as well as Atrovent nebs every 6 hours when necessary for her wheezing.  Family would like minimal labs done but since we are making some medication changes here they are okay with ordering a metabolic panel on Monday, February 27.  Regards to pain issues we will start tramadol 50 mg twice a day for pain continues the Norco as well as for any increased pain hopefully routine pain medication generally make her more comfortable.  Marland Kitchen       atrial fibrillation this appears rate controlled she also metoprolol prophylaxis with aspirin she is not on anything else secondary to history of falls   thrombocytopenia appears platelets 114,000 on most recent lab family desires minimal lab for follow-up.  # .  .  [id number] note greater than 35 minutes spent assessing patient-discussing her status with her family at bedside-as well as with hospice nurse and social worker reviewing her chart- coordinating and formulating a plan of care with extensive hospice and family input     LASSEN, ARLO C,

## 2016-01-15 DIAGNOSIS — I10 Essential (primary) hypertension: Secondary | ICD-10-CM | POA: Diagnosis not present

## 2016-01-15 DIAGNOSIS — I519 Heart disease, unspecified: Secondary | ICD-10-CM | POA: Diagnosis not present

## 2016-01-15 DIAGNOSIS — I739 Peripheral vascular disease, unspecified: Secondary | ICD-10-CM | POA: Diagnosis not present

## 2016-01-15 DIAGNOSIS — R609 Edema, unspecified: Secondary | ICD-10-CM | POA: Diagnosis not present

## 2016-01-15 DIAGNOSIS — I509 Heart failure, unspecified: Secondary | ICD-10-CM | POA: Diagnosis not present

## 2016-01-15 DIAGNOSIS — I4891 Unspecified atrial fibrillation: Secondary | ICD-10-CM | POA: Diagnosis not present

## 2016-01-16 DIAGNOSIS — R609 Edema, unspecified: Secondary | ICD-10-CM | POA: Diagnosis not present

## 2016-01-16 DIAGNOSIS — I509 Heart failure, unspecified: Secondary | ICD-10-CM | POA: Diagnosis not present

## 2016-01-16 DIAGNOSIS — I10 Essential (primary) hypertension: Secondary | ICD-10-CM | POA: Diagnosis not present

## 2016-01-16 DIAGNOSIS — I4891 Unspecified atrial fibrillation: Secondary | ICD-10-CM | POA: Diagnosis not present

## 2016-01-16 DIAGNOSIS — I519 Heart disease, unspecified: Secondary | ICD-10-CM | POA: Diagnosis not present

## 2016-01-16 DIAGNOSIS — I739 Peripheral vascular disease, unspecified: Secondary | ICD-10-CM | POA: Diagnosis not present

## 2016-01-16 LAB — BASIC METABOLIC PANEL
BUN: 11 mg/dL (ref 4–21)
CREATININE: 0.6 mg/dL (ref 0.5–1.1)
Glucose: 127 mg/dL
POTASSIUM: 3.9 mmol/L (ref 3.4–5.3)
SODIUM: 141 mmol/L (ref 137–147)

## 2016-01-17 DIAGNOSIS — I10 Essential (primary) hypertension: Secondary | ICD-10-CM | POA: Diagnosis not present

## 2016-01-17 DIAGNOSIS — I739 Peripheral vascular disease, unspecified: Secondary | ICD-10-CM | POA: Diagnosis not present

## 2016-01-17 DIAGNOSIS — I519 Heart disease, unspecified: Secondary | ICD-10-CM | POA: Diagnosis not present

## 2016-01-17 DIAGNOSIS — I4891 Unspecified atrial fibrillation: Secondary | ICD-10-CM | POA: Diagnosis not present

## 2016-01-17 DIAGNOSIS — I509 Heart failure, unspecified: Secondary | ICD-10-CM | POA: Diagnosis not present

## 2016-01-17 DIAGNOSIS — R609 Edema, unspecified: Secondary | ICD-10-CM | POA: Diagnosis not present

## 2016-01-18 DIAGNOSIS — J449 Chronic obstructive pulmonary disease, unspecified: Secondary | ICD-10-CM | POA: Diagnosis not present

## 2016-01-18 DIAGNOSIS — I509 Heart failure, unspecified: Secondary | ICD-10-CM | POA: Diagnosis not present

## 2016-01-18 DIAGNOSIS — H409 Unspecified glaucoma: Secondary | ICD-10-CM | POA: Diagnosis not present

## 2016-01-18 DIAGNOSIS — K219 Gastro-esophageal reflux disease without esophagitis: Secondary | ICD-10-CM | POA: Diagnosis not present

## 2016-01-18 DIAGNOSIS — I4891 Unspecified atrial fibrillation: Secondary | ICD-10-CM | POA: Diagnosis not present

## 2016-01-18 DIAGNOSIS — I739 Peripheral vascular disease, unspecified: Secondary | ICD-10-CM | POA: Diagnosis not present

## 2016-01-18 DIAGNOSIS — R0902 Hypoxemia: Secondary | ICD-10-CM | POA: Diagnosis not present

## 2016-01-18 DIAGNOSIS — E559 Vitamin D deficiency, unspecified: Secondary | ICD-10-CM | POA: Diagnosis not present

## 2016-01-18 DIAGNOSIS — R609 Edema, unspecified: Secondary | ICD-10-CM | POA: Diagnosis not present

## 2016-01-18 DIAGNOSIS — H11149 Conjunctival xerosis, unspecified, unspecified eye: Secondary | ICD-10-CM | POA: Diagnosis not present

## 2016-01-18 DIAGNOSIS — I10 Essential (primary) hypertension: Secondary | ICD-10-CM | POA: Diagnosis not present

## 2016-01-18 DIAGNOSIS — N390 Urinary tract infection, site not specified: Secondary | ICD-10-CM | POA: Diagnosis not present

## 2016-01-18 DIAGNOSIS — I519 Heart disease, unspecified: Secondary | ICD-10-CM | POA: Diagnosis not present

## 2016-01-18 DIAGNOSIS — F339 Major depressive disorder, recurrent, unspecified: Secondary | ICD-10-CM | POA: Diagnosis not present

## 2016-01-19 DIAGNOSIS — I519 Heart disease, unspecified: Secondary | ICD-10-CM | POA: Diagnosis not present

## 2016-01-19 DIAGNOSIS — I739 Peripheral vascular disease, unspecified: Secondary | ICD-10-CM | POA: Diagnosis not present

## 2016-01-19 DIAGNOSIS — R609 Edema, unspecified: Secondary | ICD-10-CM | POA: Diagnosis not present

## 2016-01-19 DIAGNOSIS — I4891 Unspecified atrial fibrillation: Secondary | ICD-10-CM | POA: Diagnosis not present

## 2016-01-19 DIAGNOSIS — I509 Heart failure, unspecified: Secondary | ICD-10-CM | POA: Diagnosis not present

## 2016-01-19 DIAGNOSIS — I10 Essential (primary) hypertension: Secondary | ICD-10-CM | POA: Diagnosis not present

## 2016-01-20 DIAGNOSIS — I509 Heart failure, unspecified: Secondary | ICD-10-CM | POA: Diagnosis not present

## 2016-01-20 DIAGNOSIS — I519 Heart disease, unspecified: Secondary | ICD-10-CM | POA: Diagnosis not present

## 2016-01-20 DIAGNOSIS — R609 Edema, unspecified: Secondary | ICD-10-CM | POA: Diagnosis not present

## 2016-01-20 DIAGNOSIS — I4891 Unspecified atrial fibrillation: Secondary | ICD-10-CM | POA: Diagnosis not present

## 2016-01-20 DIAGNOSIS — I10 Essential (primary) hypertension: Secondary | ICD-10-CM | POA: Diagnosis not present

## 2016-01-20 DIAGNOSIS — I739 Peripheral vascular disease, unspecified: Secondary | ICD-10-CM | POA: Diagnosis not present

## 2016-01-23 DIAGNOSIS — I739 Peripheral vascular disease, unspecified: Secondary | ICD-10-CM | POA: Diagnosis not present

## 2016-01-23 DIAGNOSIS — I4891 Unspecified atrial fibrillation: Secondary | ICD-10-CM | POA: Diagnosis not present

## 2016-01-23 DIAGNOSIS — I10 Essential (primary) hypertension: Secondary | ICD-10-CM | POA: Diagnosis not present

## 2016-01-23 DIAGNOSIS — R609 Edema, unspecified: Secondary | ICD-10-CM | POA: Diagnosis not present

## 2016-01-23 DIAGNOSIS — I519 Heart disease, unspecified: Secondary | ICD-10-CM | POA: Diagnosis not present

## 2016-01-23 DIAGNOSIS — I509 Heart failure, unspecified: Secondary | ICD-10-CM | POA: Diagnosis not present

## 2016-01-24 ENCOUNTER — Non-Acute Institutional Stay (SKILLED_NURSING_FACILITY): Payer: Medicare Other | Admitting: Internal Medicine

## 2016-01-24 ENCOUNTER — Encounter: Payer: Self-pay | Admitting: Internal Medicine

## 2016-01-24 DIAGNOSIS — R400 Somnolence: Secondary | ICD-10-CM | POA: Diagnosis not present

## 2016-01-24 DIAGNOSIS — I509 Heart failure, unspecified: Secondary | ICD-10-CM | POA: Diagnosis not present

## 2016-01-24 DIAGNOSIS — I519 Heart disease, unspecified: Secondary | ICD-10-CM | POA: Diagnosis not present

## 2016-01-24 DIAGNOSIS — I10 Essential (primary) hypertension: Secondary | ICD-10-CM | POA: Diagnosis not present

## 2016-01-24 DIAGNOSIS — R609 Edema, unspecified: Secondary | ICD-10-CM | POA: Diagnosis not present

## 2016-01-24 DIAGNOSIS — I739 Peripheral vascular disease, unspecified: Secondary | ICD-10-CM

## 2016-01-24 DIAGNOSIS — I4891 Unspecified atrial fibrillation: Secondary | ICD-10-CM | POA: Diagnosis not present

## 2016-01-24 HISTORY — DX: Peripheral vascular disease, unspecified: I73.9

## 2016-01-24 NOTE — Progress Notes (Signed)
MRN: [id number] Name: Mariah [name]  Sex: female Age: 82 y.o. DOB: August 17, 1919  [place] [facility] Facility/Room:213 Level Of Care: SNF Provider: [name] D Emergency Contacts: Extended Emergency Contact Information Primary Emergency Contact: [name],[name] Address: [address]          [place], [place] [address] [name] of [place] Phone: [phone] Relation: Daughter  Code Status:   Allergies: Lyrica and Penicillins  Chief Complaint  Patient presents with  . Acute Visit    HPI: Patient is 82 y.o. female who DON asked me to see because she has a cold blue R foot. Pt's daughter and son in law are in the room. Once there it appears that foot, now that leg has been unwrapped is much less blue and cool. Pt does not appear in pain . B legs have been wrapped for edema.  Past Medical History  Diagnosis Date  . COPD (chronic obstructive pulmonary disease) ([place])   . Atrial fibrillation ([place])   . Hypertension   . CHF (congestive heart failure) [facility])     Past Surgical History  Procedure Laterality Date  . Hernia repair    . Femur im nail Left 09/09/2015    Procedure: INTRAMEDULLARY (IM) NAIL FEMORAL;  Surgeon: Rod Can, MD;  [facility];  Service: Orthopedics;  Laterality: Left;      Medication List       This list is accurate as of: 01/24/16  7:56 PM.  Always use your most recent med list.               acetaminophen 500 MG tablet  Commonly known as:  TYLENOL  Take 1,000 mg by mouth 3 (three) times daily.     aspirin 325 MG tablet  Take 325 mg by mouth daily.     brimonidine 0.1 % Soln  Commonly known as:  ALPHAGAN P  Place 1 drop into both eyes 2 (two) times daily.     furosemide 40 MG tablet  Commonly known as:  LASIX  Take 40 mg by mouth daily.     gabapentin 100 MG capsule  Commonly known as:  NEURONTIN  Take 100 mg by mouth at bedtime.     HYDROcodone-acetaminophen 5-325 MG tablet  Commonly known as:  NORCO/VICODIN  Take 1  tablet by mouth every 6 (six) hours as needed for severe pain.     ipratropium 0.02 % nebulizer solution  Commonly known as:  ATROVENT  Take 0.5 mg by nebulization every 6 (six) hours as needed for wheezing or shortness of breath.     latanoprost 0.005 % ophthalmic solution  Commonly known as:  XALATAN  Place 1 drop into both eyes at bedtime.     Melatonin 1 MG Tabs  Take 1 tablet by mouth daily.     metoprolol succinate 50 MG 24 hr tablet  Commonly known as:  TOPROL-XL  Take 50 mg by mouth daily. Take with or immediately following a meal.     mirtazapine 15 MG tablet  Commonly known as:  REMERON  Take 15 mg by mouth at bedtime as needed (sleep).     potassium chloride 10 MEQ CR capsule  Commonly known as:  MICRO-K  Take 10 mEq by mouth 2 (two) times daily.     spironolactone 12.5 mg Tabs tablet  Commonly known as:  ALDACTONE  Take 6.25 mg by mouth daily.     Vitamin D3 5000 units Caps  Take 1 capsule by mouth daily.  No orders of the defined types were placed in this encounter.     There is no immunization history on file for this patient.  Social History  Substance Use Topics  . Smoking status: Never Smoker   . Smokeless tobacco: Not on file  . Alcohol Use: No    Review of Systems  DATA OBTAINED: from patient, nurse, daughter GENERAL:  no fevers,  appetite changes; pt is too sleepy in the day with tramadol 50 mg in am SKIN: No itching, rash HEENT: No complaint RESPIRATORY: No cough, wheezing, SOB CARDIAC: No chest pain, palpitations, no lower extremity edema; L foot is light purple and cool, slow but present cap refill, slight DP pulse  GI: No abdominal pain, No N/V/D or constipation, No heartburn or reflux  GU: No dysuria, frequency or urgency, or incontinence  MUSCULOSKELETAL: No unrelieved bone/joint pain NEUROLOGIC: No headache, dizziness  PSYCHIATRIC: No overt anxiety or sadness  Filed Vitals:   01/24/16 1842  BP: 100/65  Pulse: 95  Temp:  97 F (36.1 C)  Resp: 18    Physical Exam  GENERAL APPEARANCE: Alert, conversant, No acute distress  SKIN: No diaphoresis rash, or wounds HEENT: Unremarkable RESPIRATORY: Breathing is even, unlabored. Lung sounds are clear   CARDIOVASCULAR: Heart RRR no murmurs, rubs or gallops. No peripheral edema; R foot is light purple and cool, slow but present cap refill, slight DP pulse ; no wounds or skin breakdowns present  GASTROINTESTINAL: Abdomen is soft, non-tender, not distended w/ normal bowel sounds.  GENITOURINARY: Bladder non tender, not distended  MUSCULOSKELETAL: No abnormal joints or musculature NEUROLOGIC: Cranial nerves 2-12 grossly intact. Moves all extremities PSYCHIATRIC: Mood and affect appropriate to situation, no behavioral issues  Patient Active Problem List   Diagnosis Date Noted  . PVD (peripheral vascular disease) ([place]) 01/24/2016  . Dementia without behavioral disturbance 01/01/2016  . Encounter for family conference with patient present 01/01/2016  . Aspiration of food 12/17/2015  . Speech abnormality 11/29/2015  . Senile purpura ([place]) 11/29/2015  . Hip pain, acute 11/06/2015  . Acute encephalopathy 11/06/2015  . Bilateral lower extremity edema 11/06/2015  . UTI (urinary tract infection) 11/06/2015  . Chronic respiratory failure with hypoxia ([place]) 11/06/2015  . Closed left hip fracture ([place]) 09/09/2015  . CHF (congestive heart failure) ([place]) 09/09/2015  . COPD (chronic obstructive pulmonary disease) ([place])   . Atrial fibrillation ([place])   . Hypertensive heart disease with CHF (congestive heart failure) ([place])   . Chronic bronchitis ([place])   . Essential hypertension   . Fall   . Closed pertrochanteric fracture ([facility])     CBC    Component Value Date/Time   WBC 9.0 09/13/2015 0448   RBC 2.89* 09/13/2015 0448   HGB 9.4* 09/13/2015 0448   HCT 28.7* 09/13/2015 0448   PLT 137* 09/13/2015 0448   MCV 99.3 09/13/2015 0448   LYMPHSABS 1.1 09/08/2015 2325   MONOABS  0.8 09/08/2015 2325   EOSABS 0.1 09/08/2015 2325   BASOSABS 0.0 09/08/2015 2325    CMP     Component Value Date/Time   NA 138 09/13/2015 0448   K 4.3 09/13/2015 0448   CL 107 09/13/2015 0448   CO2 24 09/13/2015 0448   GLUCOSE 101* 09/13/2015 0448   BUN 16 09/13/2015 0448   CREATININE 0.66 09/13/2015 0448   CALCIUM 8.7* 09/13/2015 0448   PROT 6.9 09/08/2015 2325   ALBUMIN 4.1 09/08/2015 2325   AST 26 09/08/2015 2325   ALT 11* 09/08/2015 2325  ALKPHOS 56 09/08/2015 2325   BILITOT 1.3* 09/08/2015 2325   GFRNONAA >60 09/13/2015 0448   GFRAA >60 09/13/2015 0448    Assessment and Plan  PVD (peripheral vascular disease) ([place]) Per daughter pt has known PVD. I discussed with her even if we discovered artery completely blocked would she want anything done and she said that her mother would not wish it and I agree. Pt is on ASA 325 mg daily which will continue. All are in agreement  SOMNALENCE  -From tramadol- have d/c am tramadol; discussed with daughter will continue pm tramadol and monitor   [name], MD

## 2016-01-24 NOTE — Assessment & Plan Note (Signed)
Per daughter pt has known PVD. I discussed with her even if we discovered artery completely blocked would she want anything done and she said that her mother would not wish it and I agree. Pt is on ASA 325 mg daily which will continue. All are in agreement

## 2016-01-25 DIAGNOSIS — I4891 Unspecified atrial fibrillation: Secondary | ICD-10-CM | POA: Diagnosis not present

## 2016-01-25 DIAGNOSIS — I509 Heart failure, unspecified: Secondary | ICD-10-CM | POA: Diagnosis not present

## 2016-01-25 DIAGNOSIS — I10 Essential (primary) hypertension: Secondary | ICD-10-CM | POA: Diagnosis not present

## 2016-01-25 DIAGNOSIS — R609 Edema, unspecified: Secondary | ICD-10-CM | POA: Diagnosis not present

## 2016-01-25 DIAGNOSIS — I519 Heart disease, unspecified: Secondary | ICD-10-CM | POA: Diagnosis not present

## 2016-01-25 DIAGNOSIS — I739 Peripheral vascular disease, unspecified: Secondary | ICD-10-CM | POA: Diagnosis not present

## 2016-01-27 DIAGNOSIS — I509 Heart failure, unspecified: Secondary | ICD-10-CM | POA: Diagnosis not present

## 2016-01-27 DIAGNOSIS — I739 Peripheral vascular disease, unspecified: Secondary | ICD-10-CM | POA: Diagnosis not present

## 2016-01-27 DIAGNOSIS — R609 Edema, unspecified: Secondary | ICD-10-CM | POA: Diagnosis not present

## 2016-01-27 DIAGNOSIS — I4891 Unspecified atrial fibrillation: Secondary | ICD-10-CM | POA: Diagnosis not present

## 2016-01-27 DIAGNOSIS — I10 Essential (primary) hypertension: Secondary | ICD-10-CM | POA: Diagnosis not present

## 2016-01-27 DIAGNOSIS — I519 Heart disease, unspecified: Secondary | ICD-10-CM | POA: Diagnosis not present

## 2016-01-28 DIAGNOSIS — I519 Heart disease, unspecified: Secondary | ICD-10-CM | POA: Diagnosis not present

## 2016-01-28 DIAGNOSIS — I10 Essential (primary) hypertension: Secondary | ICD-10-CM | POA: Diagnosis not present

## 2016-01-28 DIAGNOSIS — R609 Edema, unspecified: Secondary | ICD-10-CM | POA: Diagnosis not present

## 2016-01-28 DIAGNOSIS — I509 Heart failure, unspecified: Secondary | ICD-10-CM | POA: Diagnosis not present

## 2016-01-28 DIAGNOSIS — I739 Peripheral vascular disease, unspecified: Secondary | ICD-10-CM | POA: Diagnosis not present

## 2016-01-28 DIAGNOSIS — I4891 Unspecified atrial fibrillation: Secondary | ICD-10-CM | POA: Diagnosis not present

## 2016-01-30 DIAGNOSIS — R609 Edema, unspecified: Secondary | ICD-10-CM | POA: Diagnosis not present

## 2016-01-30 DIAGNOSIS — I509 Heart failure, unspecified: Secondary | ICD-10-CM | POA: Diagnosis not present

## 2016-01-30 DIAGNOSIS — I10 Essential (primary) hypertension: Secondary | ICD-10-CM | POA: Diagnosis not present

## 2016-01-30 DIAGNOSIS — I519 Heart disease, unspecified: Secondary | ICD-10-CM | POA: Diagnosis not present

## 2016-01-30 DIAGNOSIS — I4891 Unspecified atrial fibrillation: Secondary | ICD-10-CM | POA: Diagnosis not present

## 2016-01-30 DIAGNOSIS — I739 Peripheral vascular disease, unspecified: Secondary | ICD-10-CM | POA: Diagnosis not present

## 2016-01-31 DIAGNOSIS — I4891 Unspecified atrial fibrillation: Secondary | ICD-10-CM | POA: Diagnosis not present

## 2016-01-31 DIAGNOSIS — R609 Edema, unspecified: Secondary | ICD-10-CM | POA: Diagnosis not present

## 2016-01-31 DIAGNOSIS — I519 Heart disease, unspecified: Secondary | ICD-10-CM | POA: Diagnosis not present

## 2016-01-31 DIAGNOSIS — I739 Peripheral vascular disease, unspecified: Secondary | ICD-10-CM | POA: Diagnosis not present

## 2016-01-31 DIAGNOSIS — I10 Essential (primary) hypertension: Secondary | ICD-10-CM | POA: Diagnosis not present

## 2016-01-31 DIAGNOSIS — I509 Heart failure, unspecified: Secondary | ICD-10-CM | POA: Diagnosis not present

## 2016-02-01 ENCOUNTER — Non-Acute Institutional Stay (SKILLED_NURSING_FACILITY): Payer: Medicare Other | Admitting: Internal Medicine

## 2016-02-01 DIAGNOSIS — I4891 Unspecified atrial fibrillation: Secondary | ICD-10-CM | POA: Diagnosis not present

## 2016-02-01 DIAGNOSIS — I519 Heart disease, unspecified: Secondary | ICD-10-CM | POA: Diagnosis not present

## 2016-02-01 DIAGNOSIS — I739 Peripheral vascular disease, unspecified: Secondary | ICD-10-CM | POA: Diagnosis not present

## 2016-02-01 DIAGNOSIS — F0390 Unspecified dementia without behavioral disturbance: Secondary | ICD-10-CM | POA: Diagnosis not present

## 2016-02-01 DIAGNOSIS — I10 Essential (primary) hypertension: Secondary | ICD-10-CM | POA: Diagnosis not present

## 2016-02-01 DIAGNOSIS — R609 Edema, unspecified: Secondary | ICD-10-CM | POA: Diagnosis not present

## 2016-02-01 DIAGNOSIS — I509 Heart failure, unspecified: Secondary | ICD-10-CM | POA: Diagnosis not present

## 2016-02-03 DIAGNOSIS — I10 Essential (primary) hypertension: Secondary | ICD-10-CM | POA: Diagnosis not present

## 2016-02-03 DIAGNOSIS — I509 Heart failure, unspecified: Secondary | ICD-10-CM | POA: Diagnosis not present

## 2016-02-03 DIAGNOSIS — I519 Heart disease, unspecified: Secondary | ICD-10-CM | POA: Diagnosis not present

## 2016-02-03 DIAGNOSIS — R609 Edema, unspecified: Secondary | ICD-10-CM | POA: Diagnosis not present

## 2016-02-03 DIAGNOSIS — I739 Peripheral vascular disease, unspecified: Secondary | ICD-10-CM | POA: Diagnosis not present

## 2016-02-03 DIAGNOSIS — I4891 Unspecified atrial fibrillation: Secondary | ICD-10-CM | POA: Diagnosis not present

## 2016-02-06 DIAGNOSIS — I509 Heart failure, unspecified: Secondary | ICD-10-CM | POA: Diagnosis not present

## 2016-02-06 DIAGNOSIS — I10 Essential (primary) hypertension: Secondary | ICD-10-CM | POA: Diagnosis not present

## 2016-02-06 DIAGNOSIS — I739 Peripheral vascular disease, unspecified: Secondary | ICD-10-CM | POA: Diagnosis not present

## 2016-02-06 DIAGNOSIS — I4891 Unspecified atrial fibrillation: Secondary | ICD-10-CM | POA: Diagnosis not present

## 2016-02-06 DIAGNOSIS — I519 Heart disease, unspecified: Secondary | ICD-10-CM | POA: Diagnosis not present

## 2016-02-06 DIAGNOSIS — R609 Edema, unspecified: Secondary | ICD-10-CM | POA: Diagnosis not present

## 2016-02-07 DIAGNOSIS — I4891 Unspecified atrial fibrillation: Secondary | ICD-10-CM | POA: Diagnosis not present

## 2016-02-07 DIAGNOSIS — R609 Edema, unspecified: Secondary | ICD-10-CM | POA: Diagnosis not present

## 2016-02-07 DIAGNOSIS — I10 Essential (primary) hypertension: Secondary | ICD-10-CM | POA: Diagnosis not present

## 2016-02-07 DIAGNOSIS — I739 Peripheral vascular disease, unspecified: Secondary | ICD-10-CM | POA: Diagnosis not present

## 2016-02-07 DIAGNOSIS — I509 Heart failure, unspecified: Secondary | ICD-10-CM | POA: Diagnosis not present

## 2016-02-07 DIAGNOSIS — I519 Heart disease, unspecified: Secondary | ICD-10-CM | POA: Diagnosis not present

## 2016-02-08 DIAGNOSIS — I739 Peripheral vascular disease, unspecified: Secondary | ICD-10-CM | POA: Diagnosis not present

## 2016-02-08 DIAGNOSIS — R609 Edema, unspecified: Secondary | ICD-10-CM | POA: Diagnosis not present

## 2016-02-08 DIAGNOSIS — I509 Heart failure, unspecified: Secondary | ICD-10-CM | POA: Diagnosis not present

## 2016-02-08 DIAGNOSIS — B351 Tinea unguium: Secondary | ICD-10-CM | POA: Diagnosis not present

## 2016-02-08 DIAGNOSIS — I10 Essential (primary) hypertension: Secondary | ICD-10-CM | POA: Diagnosis not present

## 2016-02-08 DIAGNOSIS — M79672 Pain in left foot: Secondary | ICD-10-CM | POA: Diagnosis not present

## 2016-02-08 DIAGNOSIS — M79671 Pain in right foot: Secondary | ICD-10-CM | POA: Diagnosis not present

## 2016-02-08 DIAGNOSIS — I519 Heart disease, unspecified: Secondary | ICD-10-CM | POA: Diagnosis not present

## 2016-02-08 DIAGNOSIS — I4891 Unspecified atrial fibrillation: Secondary | ICD-10-CM | POA: Diagnosis not present

## 2016-02-10 DIAGNOSIS — I10 Essential (primary) hypertension: Secondary | ICD-10-CM | POA: Diagnosis not present

## 2016-02-10 DIAGNOSIS — R609 Edema, unspecified: Secondary | ICD-10-CM | POA: Diagnosis not present

## 2016-02-10 DIAGNOSIS — I509 Heart failure, unspecified: Secondary | ICD-10-CM | POA: Diagnosis not present

## 2016-02-10 DIAGNOSIS — I519 Heart disease, unspecified: Secondary | ICD-10-CM | POA: Diagnosis not present

## 2016-02-10 DIAGNOSIS — I4891 Unspecified atrial fibrillation: Secondary | ICD-10-CM | POA: Diagnosis not present

## 2016-02-10 DIAGNOSIS — I739 Peripheral vascular disease, unspecified: Secondary | ICD-10-CM | POA: Diagnosis not present

## 2016-02-12 ENCOUNTER — Encounter: Payer: Self-pay | Admitting: Internal Medicine

## 2016-02-12 NOTE — Assessment & Plan Note (Signed)
Pt with endstage dementia going through  waxing and waning mental status and confusion. Right now she looks really good, better than usual. Spoke with son -in-law; no change in plans , will continue to follow pt's lead; he agrees

## 2016-02-12 NOTE — Progress Notes (Signed)
MRN: [id number] Name: Mariah [name]  Sex: female Age: 82 y.o. DOB: 13-May-1919  [place] #: [facility] Facility/Room:213 Level Of Care: SNF Provider: [name] D Emergency Contacts: Extended Emergency Contact Information Primary Emergency Contact: [name],[name] Address: [address]          [place], [place] [address] [name] of [place] Phone: [phone] Relation: Daughter  Code Status:   Allergies: Lyrica and Penicillins  Chief Complaint  Patient presents with  . Acute Visit    HPI: Patient is 82 y.o. female who nursing has asked me to see. They feel she may be starting to die. Po decrease in past few days and pt not easily arousable this am. Family is very concerned but understand that in any day she could go downhill.  Past Medical History  Diagnosis Date  . COPD (chronic obstructive pulmonary disease) ([place])   . Atrial fibrillation ([place])   . Hypertension   . CHF (congestive heart failure) [facility])     Past Surgical History  Procedure Laterality Date  . Hernia repair    . Femur im nail Left 09/09/2015    Procedure: INTRAMEDULLARY (IM) NAIL FEMORAL;  Surgeon: Rod Can, MD;  Location: [facility];  Service: Orthopedics;  Laterality: Left;      Medication List       This list is accurate as of: 02/01/16 11:59 PM.  Always use your most recent med list.               aspirin 325 MG tablet  Take 325 mg by mouth daily.     brimonidine 0.1 % Soln  Commonly known as:  ALPHAGAN P  Place 1 drop into both eyes 2 (two) times daily.     furosemide 40 MG tablet  Commonly known as:  LASIX  Take 40 mg by mouth daily.     gabapentin 100 MG capsule  Commonly known as:  NEURONTIN  Take 100 mg by mouth at bedtime.     ipratropium 0.02 % nebulizer solution  Commonly known as:  ATROVENT  Take 0.5 mg by nebulization every 6 (six) hours as needed for wheezing or shortness of breath.     latanoprost 0.005 % ophthalmic solution  Commonly known as:  XALATAN  Place  1 drop into both eyes at bedtime.     Melatonin 1 MG Tabs  Take 1 tablet by mouth daily.     metoprolol succinate 50 MG 24 hr tablet  Commonly known as:  TOPROL-XL  Take 50 mg by mouth daily. Take with or immediately following a meal.     mirtazapine 15 MG tablet  Commonly known as:  REMERON  Take 15 mg by mouth at bedtime as needed (sleep).     potassium chloride 10 MEQ CR capsule  Commonly known as:  MICRO-K  Take 10 mEq by mouth 2 (two) times daily.     spironolactone 12.5 mg Tabs tablet  Commonly known as:  ALDACTONE  Take 6.25 mg by mouth daily.     Vitamin D3 5000 units Caps  Take 1 capsule by mouth daily.        No orders of the defined types were placed in this encounter.     There is no immunization history on file for this patient.  Social History  Substance Use Topics  . Smoking status: Never Smoker   . Smokeless tobacco: Not on file  . Alcohol Use: No    Review of Systems  UTO 2/2 dementia- nursing as  per HPI; pt's son in law is there and noted that she looks much better than this am and is accepting some lunch. He has noted no sx suxh as cough, fever, SOB and no notice of abnormal urine   Filed Vitals:   02/12/16 1707  BP: 130/85  Pulse: 68  Temp: 97.3 F (36.3 C)  Resp: 18    Physical Exam  GENERAL APPEARANCE: Alert, conversant, No acute distress; not expected, pt is sitting up in bed,smiling and  More engaging than usual SKIN: No diaphoresis rash HEENT: Unremarkable RESPIRATORY: Breathing is even, unlabored. Lung sounds are clear   CARDIOVASCULAR: Heart RRR no murmurs, rubs or gallops. No peripheral edema  GASTROINTESTINAL: Abdomen is soft, non-tender, not distended w/ normal bowel sounds.  GENITOURINARY: Bladder non tender, not distended  MUSCULOSKELETAL: No abnormal joints or musculature NEUROLOGIC: Cranial nerves 2-12 grossly intact. Moves all extremities PSYCHIATRIC: Mood and affect appropriate to situation with dementia, no behavioral  issues  Patient Active Problem List   Diagnosis Date Noted  . PVD (peripheral vascular disease) ([place]) 01/24/2016  . Dementia without behavioral disturbance 01/01/2016  . Encounter for family conference with patient present 01/01/2016  . Aspiration of food 12/17/2015  . Speech abnormality 11/29/2015  . Senile purpura ([place]) 11/29/2015  . Hip pain, acute 11/06/2015  . Acute encephalopathy 11/06/2015  . Bilateral lower extremity edema 11/06/2015  . UTI (urinary tract infection) 11/06/2015  . Chronic respiratory failure with hypoxia ([place]) 11/06/2015  . Closed left hip fracture ([place]) 09/09/2015  . CHF (congestive heart failure) ([place]) 09/09/2015  . COPD (chronic obstructive pulmonary disease) ([place])   . Atrial fibrillation ([place])   . Hypertensive heart disease with CHF (congestive heart failure) ([place])   . Chronic bronchitis ([place])   . Essential hypertension   . Fall   . Closed pertrochanteric fracture ([facility])     CBC    Component Value Date/Time   WBC 4.0 12/22/2015   WBC 9.0 09/13/2015 0448   RBC 2.89* 09/13/2015 0448   HGB 12.9 12/22/2015   HCT 40 12/22/2015   PLT 127* 12/22/2015   MCV 99.3 09/13/2015 0448   LYMPHSABS 1.1 09/08/2015 2325   MONOABS 0.8 09/08/2015 2325   EOSABS 0.1 09/08/2015 2325   BASOSABS 0.0 09/08/2015 2325    CMP     Component Value Date/Time   NA 141 01/16/2016   NA 138 09/13/2015 0448   K 3.9 01/16/2016   CL 107 09/13/2015 0448   CO2 24 09/13/2015 0448   GLUCOSE 101* 09/13/2015 0448   BUN 11 01/16/2016   BUN 16 09/13/2015 0448   CREATININE 0.6 01/16/2016   CREATININE 0.66 09/13/2015 0448   CALCIUM 8.7* 09/13/2015 0448   PROT 6.9 09/08/2015 2325   ALBUMIN 4.1 09/08/2015 2325   AST 26 09/08/2015 2325   ALT 11* 09/08/2015 2325   ALKPHOS 56 09/08/2015 2325   BILITOT 1.3* 09/08/2015 2325   GFRNONAA >60 09/13/2015 0448   GFRAA >60 09/13/2015 0448    Assessment and Plan  Dementia without behavioral disturbance Pt with endstage dementia going  through  waxing and waning mental status and confusion. Right now she looks really good, better than usual. Spoke with son -in-law; no change in plans , will continue to follow pt's lead; he agrees   Time spent > 25 min;> 50% of time with patient was spent reviewing records, labs, tests and studies, counseling and developing plan of care  [name], MD

## 2016-02-13 DIAGNOSIS — I4891 Unspecified atrial fibrillation: Secondary | ICD-10-CM | POA: Diagnosis not present

## 2016-02-13 DIAGNOSIS — R609 Edema, unspecified: Secondary | ICD-10-CM | POA: Diagnosis not present

## 2016-02-13 DIAGNOSIS — I10 Essential (primary) hypertension: Secondary | ICD-10-CM | POA: Diagnosis not present

## 2016-02-13 DIAGNOSIS — I519 Heart disease, unspecified: Secondary | ICD-10-CM | POA: Diagnosis not present

## 2016-02-13 DIAGNOSIS — I739 Peripheral vascular disease, unspecified: Secondary | ICD-10-CM | POA: Diagnosis not present

## 2016-02-13 DIAGNOSIS — I509 Heart failure, unspecified: Secondary | ICD-10-CM | POA: Diagnosis not present

## 2016-02-14 ENCOUNTER — Emergency Department (HOSPITAL_COMMUNITY)
Admission: EM | Admit: 2016-02-14 | Discharge: 2016-02-15 | Disposition: A | Discharge status: Home / Self Care | Attending: Emergency Medicine | Admitting: Emergency Medicine

## 2016-02-14 ENCOUNTER — Emergency Department (HOSPITAL_COMMUNITY)

## 2016-02-14 ENCOUNTER — Encounter (HOSPITAL_COMMUNITY): Payer: Self-pay

## 2016-02-14 DIAGNOSIS — W01198A Fall on same level from slipping, tripping and stumbling with subsequent striking against other object, initial encounter: Secondary | ICD-10-CM | POA: Diagnosis not present

## 2016-02-14 DIAGNOSIS — S41111A Laceration without foreign body of right upper arm, initial encounter: Secondary | ICD-10-CM | POA: Insufficient documentation

## 2016-02-14 DIAGNOSIS — Y998 Other external cause status: Secondary | ICD-10-CM | POA: Diagnosis not present

## 2016-02-14 DIAGNOSIS — Z7982 Long term (current) use of aspirin: Secondary | ICD-10-CM | POA: Diagnosis not present

## 2016-02-14 DIAGNOSIS — Y92128 Other place in nursing home as the place of occurrence of the external cause: Secondary | ICD-10-CM | POA: Diagnosis not present

## 2016-02-14 DIAGNOSIS — S0191XA Laceration without foreign body of unspecified part of head, initial encounter: Secondary | ICD-10-CM

## 2016-02-14 DIAGNOSIS — I509 Heart failure, unspecified: Secondary | ICD-10-CM | POA: Insufficient documentation

## 2016-02-14 DIAGNOSIS — Z79899 Other long term (current) drug therapy: Secondary | ICD-10-CM | POA: Insufficient documentation

## 2016-02-14 DIAGNOSIS — S0181XA Laceration without foreign body of other part of head, initial encounter: Secondary | ICD-10-CM | POA: Insufficient documentation

## 2016-02-14 DIAGNOSIS — S0190XA Unspecified open wound of unspecified part of head, initial encounter: Secondary | ICD-10-CM | POA: Diagnosis not present

## 2016-02-14 DIAGNOSIS — W19XXXA Unspecified fall, initial encounter: Secondary | ICD-10-CM

## 2016-02-14 DIAGNOSIS — J449 Chronic obstructive pulmonary disease, unspecified: Secondary | ICD-10-CM | POA: Diagnosis not present

## 2016-02-14 DIAGNOSIS — I739 Peripheral vascular disease, unspecified: Secondary | ICD-10-CM | POA: Diagnosis not present

## 2016-02-14 DIAGNOSIS — R609 Edema, unspecified: Secondary | ICD-10-CM | POA: Diagnosis not present

## 2016-02-14 DIAGNOSIS — Y9389 Activity, other specified: Secondary | ICD-10-CM | POA: Insufficient documentation

## 2016-02-14 DIAGNOSIS — I10 Essential (primary) hypertension: Secondary | ICD-10-CM | POA: Insufficient documentation

## 2016-02-14 DIAGNOSIS — I4891 Unspecified atrial fibrillation: Secondary | ICD-10-CM | POA: Insufficient documentation

## 2016-02-14 DIAGNOSIS — F0390 Unspecified dementia without behavioral disturbance: Secondary | ICD-10-CM | POA: Insufficient documentation

## 2016-02-14 DIAGNOSIS — Z880 Allergy status to penicillin: Secondary | ICD-10-CM | POA: Diagnosis not present

## 2016-02-14 DIAGNOSIS — I519 Heart disease, unspecified: Secondary | ICD-10-CM | POA: Diagnosis not present

## 2016-02-14 DIAGNOSIS — T148 Other injury of unspecified body region: Secondary | ICD-10-CM | POA: Diagnosis not present

## 2016-02-14 MED ORDER — LIDOCAINE-EPINEPHRINE-TETRACAINE (LET) SOLUTION
3.0000 mL | Freq: Once | NASAL | Status: AC
  Administered 2016-02-14: 3 mL via TOPICAL
  Filled 2016-02-14: qty 3

## 2016-02-14 NOTE — ED Provider Notes (Signed)
CSN: [id number]     Arrival date & time 02/14/16  2052 History   First MD Initiated Contact with Patient 02/14/16 2120     Chief Complaint  Patient presents with  . Fall  . Head Laceration   HPI  Ms. Kauzlarich is a 82 year old female with PMHx of COPD, a fib, HTN and CHF presenting after a fall. Patient was on the commode and attempting to stand. She had reached for her call bell but was unable to push it. She lost her balance and fell forward in the bathroom. She struck her head on the floor and suffered a head laceration. She denies loss of consciousness. She is from a nursing facility who reports she is at her baseline mental status. She also suffered a skin tear to her right arm. She does not take blood thinners. She has no complaints at this time. Level V caveat secondary to dementia.  Past Medical History  Diagnosis Date  . COPD (chronic obstructive pulmonary disease) ([place])   . Atrial fibrillation ([place])   . Hypertension   . CHF (congestive heart failure) [facility])    Past Surgical History  Procedure Laterality Date  . Hernia repair    . Femur im nail Left 09/09/2015    Procedure: INTRAMEDULLARY (IM) NAIL FEMORAL;  Surgeon: Rod Can, MD;  Location: [facility];  Service: Orthopedics;  Laterality: Left;   Family History  Problem Relation Age of Onset  . Prostate cancer Brother   . Cancer Father     Patient does not remember which type of cancer   Social History  Substance Use Topics  . Smoking status: Never Smoker   . Smokeless tobacco: None  . Alcohol Use: No   OB History    No data available     Review of Systems  Unable to perform ROS: Dementia      Allergies  Lyrica and Penicillins  Home Medications   Prior to Admission medications   Medication Sig Start Date End Date Taking? Authorizing Provider  acetaminophen (TYLENOL) 325 MG tablet Take 650 mg by mouth every 4 (four) hours as needed for moderate pain.    Yes Historical Provider, MD  aspirin 325 MG tablet Take  325 mg by mouth daily.   Yes Historical Provider, MD  bisacodyl (DULCOLAX) 5 MG EC tablet Take 10 mg by mouth daily as needed for moderate constipation. Reported on 02/14/2016   Yes Historical Provider, MD  brimonidine (ALPHAGAN P) 0.1 % SOLN Place 1 drop into both eyes 2 (two) times daily.   Yes Historical Provider, MD  Cholecalciferol (VITAMIN D3) 5000 UNITS CAPS Take 5,000 Units by mouth daily.    Yes Historical Provider, MD  ENSURE PLUS (ENSURE PLUS) LIQD Take 1 Can by mouth 2 (two) times daily.   Yes Historical Provider, MD  furosemide (LASIX) 40 MG tablet Take 40 mg by mouth daily.    Yes Historical Provider, MD  gabapentin (NEURONTIN) 100 MG capsule Take 100 mg by mouth at bedtime.   Yes Historical Provider, MD  guaiFENesin (MUCINEX) 600 MG 12 hr tablet Take 600 mg by mouth 2 (two) times daily.   Yes Historical Provider, MD  guaiFENesin (ROBITUSSIN) 100 MG/5ML liquid Take 200 mg by mouth every 4 (four) hours as needed for cough.   Yes Historical Provider, MD  HYDROcodone-acetaminophen (NORCO/VICODIN) 5-325 MG tablet Take 1 tablet by mouth every 4 (four) hours as needed for moderate pain.   Yes Historical Provider, MD  ipratropium (ATROVENT) 0.02 %  nebulizer solution Take 0.5 mg by nebulization every 6 (six) hours as needed for wheezing or shortness of breath.   Yes Historical Provider, MD  latanoprost (XALATAN) 0.005 % ophthalmic solution Place 1 drop into both eyes at bedtime.   Yes Historical Provider, MD  Melatonin 1 MG TABS Take 1 mg by mouth at bedtime.    Yes Historical Provider, MD  metoprolol succinate (TOPROL-XL) 50 MG 24 hr tablet Take 50 mg by mouth daily. Take with or immediately following a meal.   Yes Historical Provider, MD  mirtazapine (REMERON) 30 MG tablet Take 30 mg by mouth at bedtime.   Yes Historical Provider, MD  pantoprazole (PROTONIX) 40 MG tablet Take 40 mg by mouth daily.   Yes Historical Provider, MD  potassium chloride (MICRO-K) 10 MEQ CR capsule Take 10 mEq by  mouth daily.    Yes Historical Provider, MD  spironolactone (ALDACTONE) 12.5 mg TABS tablet Take 6.25 mg by mouth daily.   Yes Historical Provider, MD  traMADol (ULTRAM) 50 MG tablet Take 50 mg by mouth at bedtime.   Yes Historical Provider, MD   BP 120/69 mmHg  Pulse 102  Temp(Src) 97.7 F (36.5 C) (Oral)  Resp 16  SpO2 99% Physical Exam  Constitutional: She appears well-developed and well-nourished. No distress.  HENT:  Head: Normocephalic and atraumatic.  3 cm laceration to right forehead. No active bleeding at this time. Wound edges approximate well. Irrigated and no foreign bodies visualized. No raccoon eyes. No signs of trauma to the head.   Eyes: Conjunctivae are normal. Right eye exhibits no discharge. Left eye exhibits no discharge. No scleral icterus.  Neck: Normal range of motion.  Cardiovascular: Normal rate and regular rhythm.   Pulmonary/Chest: Effort normal. No respiratory distress.  Musculoskeletal: Normal range of motion.  Neurological: She is alert. Coordination normal.  Oriented to person and place. No cranial nerve deficits. 5/5 grip and ankle strength. Sensation to light touch intact. Doesn't follow instruction for coordination testing.   Skin: Skin is warm and dry.  6 cm skin tear to posterior right upper arm. No active bleeding at this time.   Psychiatric: She has a normal mood and affect. Her behavior is normal.  Nursing note and vitals reviewed.   ED Course  .Marland KitchenLaceration Repair Date/Time: 02/15/2016 12:21 AM Performed by: Josephina Gip Authorized by: Josephina Gip Consent: Verbal consent obtained. Risks and benefits: risks, benefits and alternatives were discussed Patient understanding: patient states understanding of the procedure being performed Patient consent: the patient's understanding of the procedure matches consent given Procedure consent: procedure consent matches procedure scheduled Required items: required blood products, implants, devices,  and special equipment available Patient identity confirmed: arm band Body area: head/neck Location details: forehead Laceration length: 3 cm Foreign bodies: no foreign bodies Anesthesia: local infiltration Local anesthetic: LET (lido,epi,tetracaine) Preparation: Patient was prepped and draped in the usual sterile fashion. Irrigation solution: saline Irrigation method: jet lavage Amount of cleaning: standard Skin closure: 5-0 nylon Number of sutures: 6 Technique: simple Approximation: close Approximation difficulty: simple Patient tolerance: Patient tolerated the procedure well with no immediate complications   (including critical care time) Labs Review Labs Reviewed - No data to display  Imaging Review Ct Head Wo Contrast  02/14/2016  CLINICAL DATA:  Fall with forehead laceration and head injury. EXAM: CT HEAD WITHOUT CONTRAST CT CERVICAL SPINE WITHOUT CONTRAST TECHNIQUE: Multidetector CT imaging of the head and cervical spine was performed following the standard protocol without intravenous contrast. Multiplanar CT image reconstructions  of the cervical spine were also generated. COMPARISON:  10/25/2015 head CT.  No prior cervical spine CT. FINDINGS: CT HEAD FINDINGS No evidence of parenchymal hemorrhage or extra-axial fluid collection. No mass lesion, mass effect, or midline shift. No CT evidence of acute infarction. Intracranial atherosclerosis. Diffuse cerebral volume loss. Nonspecific stable mild to moderate subcortical and periventricular white matter hypodensity, most in keeping with chronic small vessel ischemic change. No ventriculomegaly. Complete opacification of the left sphenoid sinus. Bubbly secretions layering in the right sphenoid and right maxillary sinuses. Mucoperiosteal thickening in the left greater than right ethmoidal air cells. Near complete opacification of the left mastoid air cells, new. Mild effusion in the inferior right mastoid air cells. No evidence of calvarial  fracture. CT CERVICAL SPINE FINDINGS No fracture is detected in the cervical spine. No prevertebral soft tissue swelling. Exaggerated cervical lordosis. Dens is well positioned between the lateral masses of C1. The lateral masses appear well-aligned. Moderate degenerative disc disease is seen throughout the cervical spine. Mild bilateral facet arthropathy. Moderate foraminal stenosis on the left at C4-5. Mild foraminal stenosis on the left at C5-6. No cervical spine subluxation. No evidence of intra-axial hemorrhage in the visualized brain. No gross cervical canal hematoma. No significant pulmonary nodules at the visualized lung apices. No cervical adenopathy or other significant neck soft tissue abnormality. IMPRESSION: 1. No evidence of acute intracranial abnormality. No calvarial fracture detected. 2. New opacification of the left greater than right mastoid air cells. Given the findings of paranasal sinusitis, the mastoid opacification is likely inflammatory. No temporal bone fractures are evident on this routine head CT. 3. No fracture or subluxation in the cervical spine. 4. Cerebral volume loss and mild-to-moderate chronic small vessel ischemia. 5. Moderate degenerative changes in the cervical spine as described. Electronically Signed   By: Ilona Sorrel M.D.   On: 02/14/2016 22:15   Ct Cervical Spine Wo Contrast  02/14/2016  CLINICAL DATA:  Fall with forehead laceration and head injury. EXAM: CT HEAD WITHOUT CONTRAST CT CERVICAL SPINE WITHOUT CONTRAST TECHNIQUE: Multidetector CT imaging of the head and cervical spine was performed following the standard protocol without intravenous contrast. Multiplanar CT image reconstructions of the cervical spine were also generated. COMPARISON:  10/25/2015 head CT.  No prior cervical spine CT. FINDINGS: CT HEAD FINDINGS No evidence of parenchymal hemorrhage or extra-axial fluid collection. No mass lesion, mass effect, or midline shift. No CT evidence of acute  infarction. Intracranial atherosclerosis. Diffuse cerebral volume loss. Nonspecific stable mild to moderate subcortical and periventricular white matter hypodensity, most in keeping with chronic small vessel ischemic change. No ventriculomegaly. Complete opacification of the left sphenoid sinus. Bubbly secretions layering in the right sphenoid and right maxillary sinuses. Mucoperiosteal thickening in the left greater than right ethmoidal air cells. Near complete opacification of the left mastoid air cells, new. Mild effusion in the inferior right mastoid air cells. No evidence of calvarial fracture. CT CERVICAL SPINE FINDINGS No fracture is detected in the cervical spine. No prevertebral soft tissue swelling. Exaggerated cervical lordosis. Dens is well positioned between the lateral masses of C1. The lateral masses appear well-aligned. Moderate degenerative disc disease is seen throughout the cervical spine. Mild bilateral facet arthropathy. Moderate foraminal stenosis on the left at C4-5. Mild foraminal stenosis on the left at C5-6. No cervical spine subluxation. No evidence of intra-axial hemorrhage in the visualized brain. No gross cervical canal hematoma. No significant pulmonary nodules at the visualized lung apices. No cervical adenopathy or other significant neck soft tissue  abnormality. IMPRESSION: 1. No evidence of acute intracranial abnormality. No calvarial fracture detected. 2. New opacification of the left greater than right mastoid air cells. Given the findings of paranasal sinusitis, the mastoid opacification is likely inflammatory. No temporal bone fractures are evident on this routine head CT. 3. No fracture or subluxation in the cervical spine. 4. Cerebral volume loss and mild-to-moderate chronic small vessel ischemia. 5. Moderate degenerative changes in the cervical spine as described. Electronically Signed   By: Ilona Sorrel M.D.   On: 02/14/2016 22:15   I have personally reviewed and evaluated  these images and lab results as part of my medical decision-making.   EKG Interpretation None      MDM   Final diagnoses:  Fall  Laceration of head, initial encounter   82 year old female presenting after a fall at her nursing home. Mechanical fall as patient was attempting to stand. Struck her head on the bathroom floor with a head laceration to the right forehead. Loss of consciousness. No blood thinners. Patient is demented so level V caveat. Nonfocal neuro exam. 3 cm linear laceration noted to the right forehead. Pressure irrigation performed and base of wound visualized and bloodless field with no foreign bodies. Patient tolerated suture repair. CT head and neck negative. Patient is in a nursing facility with on site providers who will remove her sutures in one week. Return precautions given in discharge paperwork. Pt stable for discharge     Josephina Gip, PA-C 02/15/16 0044  Davonna Belling, MD 02/16/16 [id number]  Davonna Belling, MD 02/16/16 [phone]

## 2016-02-14 NOTE — ED Notes (Signed)
Bed: [facility] Expected date:  Expected time:  Means of arrival:  Comments: Fall, head injury, forearm

## 2016-02-14 NOTE — ED Notes (Addendum)
Per EMS: pt from [facility], pt fell off of commode, hit head. NO LOC. Pt has lac to forehead and skin tear to right arm, no blood thinners.

## 2016-02-15 DIAGNOSIS — R259 Unspecified abnormal involuntary movements: Secondary | ICD-10-CM | POA: Diagnosis not present

## 2016-02-15 DIAGNOSIS — I509 Heart failure, unspecified: Secondary | ICD-10-CM | POA: Diagnosis not present

## 2016-02-15 DIAGNOSIS — R609 Edema, unspecified: Secondary | ICD-10-CM | POA: Diagnosis not present

## 2016-02-15 DIAGNOSIS — I519 Heart disease, unspecified: Secondary | ICD-10-CM | POA: Diagnosis not present

## 2016-02-15 DIAGNOSIS — I4891 Unspecified atrial fibrillation: Secondary | ICD-10-CM | POA: Diagnosis not present

## 2016-02-15 DIAGNOSIS — I10 Essential (primary) hypertension: Secondary | ICD-10-CM | POA: Diagnosis not present

## 2016-02-15 DIAGNOSIS — S0191XA Laceration without foreign body of unspecified part of head, initial encounter: Secondary | ICD-10-CM | POA: Diagnosis not present

## 2016-02-15 DIAGNOSIS — I739 Peripheral vascular disease, unspecified: Secondary | ICD-10-CM | POA: Diagnosis not present

## 2016-02-15 NOTE — Discharge Instructions (Signed)
Sutured Wound Care Sutures are stitches that can be used to close wounds. Taking care of your wound properly can help to prevent pain and infection. It can also help your wound to heal more quickly. HOW TO CARE FOR YOUR SUTURED WOUND Wound Care  Keep the wound clean and dry.  If you were given a bandage (dressing), you should change it at least once per day or as directed by your health care provider. You should also change it if it becomes wet or dirty.  Keep the wound completely dry for the first 24 hours or as directed by your health care provider. After that time, you may shower or bathe. However, make sure that the wound is not soaked in water until the sutures have been removed.  Clean the wound one time each day or as directed by your health care provider.  Wash the wound with soap and water.  Rinse the wound with water to remove all soap.  Pat the wound dry with a clean towel. Do not rub the wound.  Aftercleaning the wound, apply a thin layer of antibioticointment as directed by your health care provider. This will help to prevent infection and keep the dressing from sticking to the wound.  Have the sutures removed as directed by your health care provider. General Instructions  Take or apply medicines only as directed by your health care provider.  To help prevent scarring, make sure to cover your wound with sunscreen whenever you are outside after the sutures are removed and the wound is healed. Make sure to wear a sunscreen of at least 30 SPF.  If you were prescribed an antibiotic medicine or ointment, finish all of it even if you start to feel better.  Do not scratch or pick at the wound.  Keep all follow-up visits as directed by your health care provider. This is important.  Check your wound every day for signs of infection. Watch for:   Redness, swelling, or pain.  Fluid, blood, or pus.  Raise (elevate) the injured area above the level of your heart while you  are sitting or lying down, if possible.  Avoid stretching your wound.  Drink enough fluids to keep your urine clear or pale yellow. SEEK MEDICAL CARE IF:  You received a tetanus shot and you have swelling, severe pain, redness, or bleeding at the injection site.  You have a fever.  A wound that was closed breaks open.  You notice a bad smell coming from the wound.  You notice something coming out of the wound, such as wood or glass.  Your pain is not controlled with medicine.  You have increased redness, swelling, or pain at the site of your wound.  You have fluid, blood, or pus coming from your wound.  You notice a change in the color of your skin near your wound.  You need to change the dressing frequently due to fluid, blood, or pus draining from the wound.  You develop a new rash.  You develop numbness around the wound. SEEK IMMEDIATE MEDICAL CARE IF:  You develop severe swelling around the injury site.  Your pain suddenly increases and is severe.  You develop painful lumps near the wound or on skin that is anywhere on your body.  You have a red streak going away from your wound.  The wound is on your hand or foot and you cannot properly move a finger or toe.  The wound is on your hand or foot and  you notice that your fingers or toes look pale or bluish.   This information is not intended to replace advice given to you by your health care provider. Make sure you discuss any questions you have with your health care provider.   Document Released: 12/13/2004 Document Revised: 03/22/2015 Document Reviewed: 06/17/2013 Elsevier Interactive Patient Education 2016 [place] Injury, Adult You have received a head injury. It does not appear serious at this time. Headaches and vomiting are common following head injury. It should be easy to awaken from sleeping. Sometimes it is necessary for you to stay in the emergency department for a while for observation.  Sometimes admission to the hospital may be needed. After injuries such as yours, most problems occur within the first 24 hours, but side effects may occur up to 7-10 days after the injury. It is important for you to carefully monitor your condition and contact your health care provider or seek immediate medical care if there is a change in your condition. WHAT ARE THE TYPES OF HEAD INJURIES? Head injuries can be as minor as a bump. Some head injuries can be more severe. More severe head injuries include:  A jarring injury to the brain (concussion).  A bruise of the brain (contusion). This mean there is bleeding in the brain that can cause swelling.  A cracked skull (skull fracture).  Bleeding in the brain that collects, clots, and forms a bump (hematoma). WHAT CAUSES A HEAD INJURY? A serious head injury is most likely to happen to someone who is in a car wreck and is not wearing a seat belt. Other causes of major head injuries include bicycle or motorcycle accidents, sports injuries, and falls. HOW ARE HEAD INJURIES DIAGNOSED? A complete history of the event leading to the injury and your current symptoms will be helpful in diagnosing head injuries. Many times, pictures of the brain, such as CT or MRI are needed to see the extent of the injury. Often, an overnight hospital stay is necessary for observation.  WHEN SHOULD I SEEK IMMEDIATE MEDICAL CARE?  You should get help right away if:  You have confusion or drowsiness.  You feel sick to your stomach (nauseous) or have continued, forceful vomiting.  You have dizziness or unsteadiness that is getting worse.  You have severe, continued headaches not relieved by medicine. Only take over-the-counter or prescription medicines for pain, fever, or discomfort as directed by your health care provider.  You do not have normal function of the arms or legs or are unable to walk.  You notice changes in the black spots in the center of the colored part  of your eye (pupil).  You have a clear or bloody fluid coming from your nose or ears.  You have a loss of vision. During the next 24 hours after the injury, you must stay with someone who can watch you for the warning signs. This person should contact local emergency services (911 in the [place] if you have seizures, you become unconscious, or you are unable to wake up. HOW CAN I PREVENT A HEAD INJURY IN THE FUTURE? The most important factor for preventing major head injuries is avoiding motor vehicle accidents. To minimize the potential for damage to your head, it is crucial to wear seat belts while riding in motor vehicles. Wearing helmets while bike riding and playing collision sports (like football) is also helpful. Also, avoiding dangerous activities around the house will further help reduce your risk of head injury.  WHEN  CAN I RETURN TO NORMAL ACTIVITIES AND ATHLETICS? You should be reevaluated by your health care provider before returning to these activities. If you have any of the following symptoms, you should not return to activities or contact sports until 1 week after the symptoms have stopped:  Persistent headache.  Dizziness or vertigo.  Poor attention and concentration.  Confusion.  Memory problems.  Nausea or vomiting.  Fatigue or tire easily.  Irritability.  Intolerant of bright lights or loud noises.  Anxiety or depression.  Disturbed sleep. MAKE SURE YOU:   Understand these instructions.  Will watch your condition.  Will get help right away if you are not doing well or get worse.   This information is not intended to replace advice given to you by your health care provider. Make sure you discuss any questions you have with your health care provider.   Document Released: 11/05/2005 Document Revised: 11/26/2014 Document Reviewed: 07/13/2013 Elsevier Interactive Patient Education [employer].

## 2016-02-15 NOTE — ED Notes (Signed)
Report given to [place] at [facility]

## 2016-02-15 NOTE — ED Notes (Signed)
[facility] notified for transport 

## 2016-02-17 DIAGNOSIS — I739 Peripheral vascular disease, unspecified: Secondary | ICD-10-CM | POA: Diagnosis not present

## 2016-02-17 DIAGNOSIS — I509 Heart failure, unspecified: Secondary | ICD-10-CM | POA: Diagnosis not present

## 2016-02-17 DIAGNOSIS — R609 Edema, unspecified: Secondary | ICD-10-CM | POA: Diagnosis not present

## 2016-02-17 DIAGNOSIS — I4891 Unspecified atrial fibrillation: Secondary | ICD-10-CM | POA: Diagnosis not present

## 2016-02-17 DIAGNOSIS — I519 Heart disease, unspecified: Secondary | ICD-10-CM | POA: Diagnosis not present

## 2016-02-17 DIAGNOSIS — I10 Essential (primary) hypertension: Secondary | ICD-10-CM | POA: Diagnosis not present

## 2016-02-18 DIAGNOSIS — J449 Chronic obstructive pulmonary disease, unspecified: Secondary | ICD-10-CM | POA: Diagnosis not present

## 2016-02-18 DIAGNOSIS — I739 Peripheral vascular disease, unspecified: Secondary | ICD-10-CM | POA: Diagnosis not present

## 2016-02-18 DIAGNOSIS — R609 Edema, unspecified: Secondary | ICD-10-CM | POA: Diagnosis not present

## 2016-02-18 DIAGNOSIS — I4891 Unspecified atrial fibrillation: Secondary | ICD-10-CM | POA: Diagnosis not present

## 2016-02-18 DIAGNOSIS — I519 Heart disease, unspecified: Secondary | ICD-10-CM | POA: Diagnosis not present

## 2016-02-18 DIAGNOSIS — R0902 Hypoxemia: Secondary | ICD-10-CM | POA: Diagnosis not present

## 2016-02-18 DIAGNOSIS — K219 Gastro-esophageal reflux disease without esophagitis: Secondary | ICD-10-CM | POA: Diagnosis not present

## 2016-02-18 DIAGNOSIS — I509 Heart failure, unspecified: Secondary | ICD-10-CM | POA: Diagnosis not present

## 2016-02-18 DIAGNOSIS — I10 Essential (primary) hypertension: Secondary | ICD-10-CM | POA: Diagnosis not present

## 2016-02-18 DIAGNOSIS — H11149 Conjunctival xerosis, unspecified, unspecified eye: Secondary | ICD-10-CM | POA: Diagnosis not present

## 2016-02-18 DIAGNOSIS — E559 Vitamin D deficiency, unspecified: Secondary | ICD-10-CM | POA: Diagnosis not present

## 2016-02-18 DIAGNOSIS — N390 Urinary tract infection, site not specified: Secondary | ICD-10-CM | POA: Diagnosis not present

## 2016-02-18 DIAGNOSIS — H409 Unspecified glaucoma: Secondary | ICD-10-CM | POA: Diagnosis not present

## 2016-02-18 DIAGNOSIS — F339 Major depressive disorder, recurrent, unspecified: Secondary | ICD-10-CM | POA: Diagnosis not present

## 2016-02-20 DIAGNOSIS — R609 Edema, unspecified: Secondary | ICD-10-CM | POA: Diagnosis not present

## 2016-02-20 DIAGNOSIS — I739 Peripheral vascular disease, unspecified: Secondary | ICD-10-CM | POA: Diagnosis not present

## 2016-02-20 DIAGNOSIS — I10 Essential (primary) hypertension: Secondary | ICD-10-CM | POA: Diagnosis not present

## 2016-02-20 DIAGNOSIS — I509 Heart failure, unspecified: Secondary | ICD-10-CM | POA: Diagnosis not present

## 2016-02-20 DIAGNOSIS — I4891 Unspecified atrial fibrillation: Secondary | ICD-10-CM | POA: Diagnosis not present

## 2016-02-20 DIAGNOSIS — I519 Heart disease, unspecified: Secondary | ICD-10-CM | POA: Diagnosis not present

## 2016-02-22 DIAGNOSIS — I509 Heart failure, unspecified: Secondary | ICD-10-CM | POA: Diagnosis not present

## 2016-02-22 DIAGNOSIS — I519 Heart disease, unspecified: Secondary | ICD-10-CM | POA: Diagnosis not present

## 2016-02-22 DIAGNOSIS — I4891 Unspecified atrial fibrillation: Secondary | ICD-10-CM | POA: Diagnosis not present

## 2016-02-22 DIAGNOSIS — I10 Essential (primary) hypertension: Secondary | ICD-10-CM | POA: Diagnosis not present

## 2016-02-22 DIAGNOSIS — I739 Peripheral vascular disease, unspecified: Secondary | ICD-10-CM | POA: Diagnosis not present

## 2016-02-22 DIAGNOSIS — R609 Edema, unspecified: Secondary | ICD-10-CM | POA: Diagnosis not present

## 2016-02-23 DIAGNOSIS — I10 Essential (primary) hypertension: Secondary | ICD-10-CM | POA: Diagnosis not present

## 2016-02-23 DIAGNOSIS — R609 Edema, unspecified: Secondary | ICD-10-CM | POA: Diagnosis not present

## 2016-02-23 DIAGNOSIS — I519 Heart disease, unspecified: Secondary | ICD-10-CM | POA: Diagnosis not present

## 2016-02-23 DIAGNOSIS — I739 Peripheral vascular disease, unspecified: Secondary | ICD-10-CM | POA: Diagnosis not present

## 2016-02-23 DIAGNOSIS — I509 Heart failure, unspecified: Secondary | ICD-10-CM | POA: Diagnosis not present

## 2016-02-23 DIAGNOSIS — I4891 Unspecified atrial fibrillation: Secondary | ICD-10-CM | POA: Diagnosis not present

## 2016-02-24 DIAGNOSIS — I739 Peripheral vascular disease, unspecified: Secondary | ICD-10-CM | POA: Diagnosis not present

## 2016-02-24 DIAGNOSIS — I10 Essential (primary) hypertension: Secondary | ICD-10-CM | POA: Diagnosis not present

## 2016-02-24 DIAGNOSIS — R609 Edema, unspecified: Secondary | ICD-10-CM | POA: Diagnosis not present

## 2016-02-24 DIAGNOSIS — I4891 Unspecified atrial fibrillation: Secondary | ICD-10-CM | POA: Diagnosis not present

## 2016-02-24 DIAGNOSIS — I509 Heart failure, unspecified: Secondary | ICD-10-CM | POA: Diagnosis not present

## 2016-02-24 DIAGNOSIS — I519 Heart disease, unspecified: Secondary | ICD-10-CM | POA: Diagnosis not present

## 2016-02-27 DIAGNOSIS — I509 Heart failure, unspecified: Secondary | ICD-10-CM | POA: Diagnosis not present

## 2016-02-27 DIAGNOSIS — R609 Edema, unspecified: Secondary | ICD-10-CM | POA: Diagnosis not present

## 2016-02-27 DIAGNOSIS — I739 Peripheral vascular disease, unspecified: Secondary | ICD-10-CM | POA: Diagnosis not present

## 2016-02-27 DIAGNOSIS — I519 Heart disease, unspecified: Secondary | ICD-10-CM | POA: Diagnosis not present

## 2016-02-27 DIAGNOSIS — I10 Essential (primary) hypertension: Secondary | ICD-10-CM | POA: Diagnosis not present

## 2016-02-27 DIAGNOSIS — I4891 Unspecified atrial fibrillation: Secondary | ICD-10-CM | POA: Diagnosis not present

## 2016-02-28 DIAGNOSIS — I739 Peripheral vascular disease, unspecified: Secondary | ICD-10-CM | POA: Diagnosis not present

## 2016-02-28 DIAGNOSIS — I519 Heart disease, unspecified: Secondary | ICD-10-CM | POA: Diagnosis not present

## 2016-02-28 DIAGNOSIS — I4891 Unspecified atrial fibrillation: Secondary | ICD-10-CM | POA: Diagnosis not present

## 2016-02-28 DIAGNOSIS — I10 Essential (primary) hypertension: Secondary | ICD-10-CM | POA: Diagnosis not present

## 2016-02-28 DIAGNOSIS — I509 Heart failure, unspecified: Secondary | ICD-10-CM | POA: Diagnosis not present

## 2016-02-28 DIAGNOSIS — R609 Edema, unspecified: Secondary | ICD-10-CM | POA: Diagnosis not present

## 2016-02-29 DIAGNOSIS — I509 Heart failure, unspecified: Secondary | ICD-10-CM | POA: Diagnosis not present

## 2016-02-29 DIAGNOSIS — I4891 Unspecified atrial fibrillation: Secondary | ICD-10-CM | POA: Diagnosis not present

## 2016-02-29 DIAGNOSIS — R609 Edema, unspecified: Secondary | ICD-10-CM | POA: Diagnosis not present

## 2016-02-29 DIAGNOSIS — I10 Essential (primary) hypertension: Secondary | ICD-10-CM | POA: Diagnosis not present

## 2016-02-29 DIAGNOSIS — I519 Heart disease, unspecified: Secondary | ICD-10-CM | POA: Diagnosis not present

## 2016-02-29 DIAGNOSIS — I739 Peripheral vascular disease, unspecified: Secondary | ICD-10-CM | POA: Diagnosis not present

## 2016-03-05 DIAGNOSIS — I4891 Unspecified atrial fibrillation: Secondary | ICD-10-CM | POA: Diagnosis not present

## 2016-03-05 DIAGNOSIS — R609 Edema, unspecified: Secondary | ICD-10-CM | POA: Diagnosis not present

## 2016-03-05 DIAGNOSIS — I739 Peripheral vascular disease, unspecified: Secondary | ICD-10-CM | POA: Diagnosis not present

## 2016-03-05 DIAGNOSIS — I509 Heart failure, unspecified: Secondary | ICD-10-CM | POA: Diagnosis not present

## 2016-03-05 DIAGNOSIS — I519 Heart disease, unspecified: Secondary | ICD-10-CM | POA: Diagnosis not present

## 2016-03-05 DIAGNOSIS — I10 Essential (primary) hypertension: Secondary | ICD-10-CM | POA: Diagnosis not present

## 2016-03-06 DIAGNOSIS — I4891 Unspecified atrial fibrillation: Secondary | ICD-10-CM | POA: Diagnosis not present

## 2016-03-06 DIAGNOSIS — I10 Essential (primary) hypertension: Secondary | ICD-10-CM | POA: Diagnosis not present

## 2016-03-06 DIAGNOSIS — I519 Heart disease, unspecified: Secondary | ICD-10-CM | POA: Diagnosis not present

## 2016-03-06 DIAGNOSIS — I739 Peripheral vascular disease, unspecified: Secondary | ICD-10-CM | POA: Diagnosis not present

## 2016-03-06 DIAGNOSIS — R609 Edema, unspecified: Secondary | ICD-10-CM | POA: Diagnosis not present

## 2016-03-06 DIAGNOSIS — I509 Heart failure, unspecified: Secondary | ICD-10-CM | POA: Diagnosis not present

## 2016-03-07 DIAGNOSIS — I10 Essential (primary) hypertension: Secondary | ICD-10-CM | POA: Diagnosis not present

## 2016-03-07 DIAGNOSIS — R609 Edema, unspecified: Secondary | ICD-10-CM | POA: Diagnosis not present

## 2016-03-07 DIAGNOSIS — I519 Heart disease, unspecified: Secondary | ICD-10-CM | POA: Diagnosis not present

## 2016-03-07 DIAGNOSIS — I509 Heart failure, unspecified: Secondary | ICD-10-CM | POA: Diagnosis not present

## 2016-03-07 DIAGNOSIS — I4891 Unspecified atrial fibrillation: Secondary | ICD-10-CM | POA: Diagnosis not present

## 2016-03-07 DIAGNOSIS — I739 Peripheral vascular disease, unspecified: Secondary | ICD-10-CM | POA: Diagnosis not present

## 2016-03-08 ENCOUNTER — Non-Acute Institutional Stay (SKILLED_NURSING_FACILITY): Payer: Medicare Other | Admitting: Internal Medicine

## 2016-03-08 DIAGNOSIS — J42 Unspecified chronic bronchitis: Secondary | ICD-10-CM

## 2016-03-08 DIAGNOSIS — H6122 Impacted cerumen, left ear: Secondary | ICD-10-CM

## 2016-03-08 NOTE — Progress Notes (Signed)
Patient ID: Mariah [name], female   DOB: [birth year], 82 y.o.   MRN: [id number] MRN: [id number] Name: Mariah [name]  Sex: female Age: 82 y.o. DOB: October 01, 1977  [place] [name] Facility/Room:213 Level Of Care: SNF Provider: Wille Celeste Emergency Contacts: Extended Emergency Contact Information Primary Emergency Contact: [name],[name] Address: [address]          [place], [place] [address] [name] of [place] Phone: [phone] Relation: Daughter  Code Status:   Allergies: Lyrica and Penicillins  Chief complaint-acute visit secondary to left ear feeling plugged.  Follow-up COPD CHF  HPI: Patient is 82 y.o. female who apparently has been complaining of some left ear changes talking to her she says it feels like at times is plugged another times whistles a bit.  She denies any trauma to the area.  Patient is under hospice services with a history of end-stage COPD and CHF-nonetheless this appears to be relatively stable apparently she has good. 7 trying..  But she appears to be bright and alert today and interactive.  In regards to CHF she is on Lasix 40 mg a day as well as Aldactone 6.25 mg a day.  In regards respiratory issues she does continue on Mucinex twice a day as well as inhalers as needed.     Past Medical History  Diagnosis Date  . COPD (chronic obstructive pulmonary disease) ([place])   . Atrial fibrillation ([place])   . Hypertension   . CHF (congestive heart failure) [facility])     Past Surgical History  Procedure Laterality Date  . Hernia repair    . Femur im nail Left 09/09/2015    Procedure: INTRAMEDULLARY (IM) NAIL FEMORAL;  Surgeon: Rod Can, MD;  Location: [facility];  Service: Orthopedics;  Laterality: Left;      Medication List       This list is accurate as of: 03/08/16 11:59 PM.  Always use your most recent med list.               acetaminophen 325 MG tablet  Commonly known as:  TYLENOL  Take 650 mg by mouth every 4 (four) hours as  needed for moderate pain.     aspirin 325 MG tablet  Take 325 mg by mouth daily.     bisacodyl 5 MG EC tablet  Commonly known as:  DULCOLAX  Take 10 mg by mouth daily as needed for moderate constipation. Reported on 02/14/2016     brimonidine 0.1 % Soln  Commonly known as:  ALPHAGAN P  Place 1 drop into both eyes 2 (two) times daily.     ENSURE PLUS Liqd  Take 1 Can by mouth 2 (two) times daily.     furosemide 40 MG tablet  Commonly known as:  LASIX  Take 40 mg by mouth daily.     gabapentin 100 MG capsule  Commonly known as:  NEURONTIN  Take 100 mg by mouth at bedtime.     guaiFENesin 600 MG 12 hr tablet  Commonly known as:  MUCINEX  Take 600 mg by mouth 2 (two) times daily.     guaiFENesin 100 MG/5ML liquid  Commonly known as:  ROBITUSSIN  Take 200 mg by mouth every 4 (four) hours as needed for cough.     HYDROcodone-acetaminophen 5-325 MG tablet  Commonly known as:  NORCO/VICODIN  Take 1 tablet by mouth every 4 (four) hours as needed for moderate pain.     ipratropium 0.02 % nebulizer solution  Commonly known as:  ATROVENT  Take 0.5 mg by nebulization every 6 (six) hours as needed for wheezing or shortness of breath.     latanoprost 0.005 % ophthalmic solution  Commonly known as:  XALATAN  Place 1 drop into both eyes at bedtime.     Melatonin 1 MG Tabs  Take 1 mg by mouth at bedtime.     metoprolol succinate 50 MG 24 hr tablet  Commonly known as:  TOPROL-XL  Take 50 mg by mouth daily. Take with or immediately following a meal.     mirtazapine 30 MG tablet  Commonly known as:  REMERON  Take 30 mg by mouth at bedtime.     pantoprazole 40 MG tablet  Commonly known as:  PROTONIX  Take 40 mg by mouth daily.     potassium chloride 10 MEQ CR capsule  Commonly known as:  MICRO-K  Take 10 mEq by mouth daily.     spironolactone 12.5 mg Tabs tablet  Commonly known as:  ALDACTONE  Take 6.25 mg by mouth daily.     traMADol 50 MG tablet  Commonly known as:   ULTRAM  Take 50 mg by mouth at bedtime.     Vitamin D3 5000 units Caps  Take 5,000 Units by mouth daily.            Social History  Substance Use Topics  . Smoking status: Never Smoker   . Smokeless tobacco: Not on file  . Alcohol Use: No    Review of Systems   Somewhat limited secondary to patient being a poor historian.  In general no complaints of fever or chills.  Head ears eyes nose mouth and throat again only complaint is some left ear changes as noted above.  Respiratory is not complaining of acute shortness of breath or cough.  Cardiac does not complain of chest pain.  GI is not complaining of abdominal discomfort nausea or vomiting.  Musculoskeletal has significant weakness but is not really complaining of joint pain.  Neurologic is not complaining of dizziness or syncope.     There were no vitals filed for this visit.  Physical Exam  GENERAL APPEARANCE: Alert, conversant, No acute distress; not expected, pt is up in wheelchair and,smiling and engaging  SKIN: No diaphoresis rash HEENT: Sclera and conjunctiva are clear.  Left ear appears to have small amount of wax buildup I do not see evidence of bleeding or exudate I could see the tympanic membrane partially.  Right ear appears to be cleaerr with TM visualized RESPIRATORY: Breathing is even, unlabored. Lung sounds are clear  with shallow air entry CARDIOVASCULAR: Heart RRR no murmurs, rubs or gallops. Has one plus edema venous stasis changes lower extremities GASTROINTESTINAL: Abdomen is soft, non-tender, not distended w/ normal bowel sounds.  GENITOURINARY: Bladder non tender, not distended  MUSCULOSKELETAL: No abnormal joints or musculature NEUROLOGIC: Cranial nerves 2-12 grossly intact. Moves all extremities PSYCHIATRIC: Mood and affect appropriate to situation with dementia, no behavioral issues  Patient Active Problem List   Diagnosis Date Noted  . PVD (peripheral vascular disease) ([place])  01/24/2016  . Dementia without behavioral disturbance 01/01/2016  . Encounter for family conference with patient present 01/01/2016  . Aspiration of food 12/17/2015  . Speech abnormality 11/29/2015  . Senile purpura ([place]) 11/29/2015  . Hip pain, acute 11/06/2015  . Acute encephalopathy 11/06/2015  . Bilateral lower extremity edema 11/06/2015  . UTI (urinary tract infection) 11/06/2015  . Chronic respiratory failure with hypoxia ([place]) 11/06/2015  . Closed left  hip fracture ([place]) 09/09/2015  . CHF (congestive heart failure) ([place]) 09/09/2015  . COPD (chronic obstructive pulmonary disease) ([place])   . Atrial fibrillation ([place])   . Hypertensive heart disease with CHF (congestive heart failure) ([place])   . Chronic bronchitis ([place])   . Essential hypertension   . Fall   . Closed pertrochanteric fracture ([facility])     CBC    Component Value Date/Time   WBC 4.0 12/22/2015   WBC 9.0 09/13/2015 0448   RBC 2.89* 09/13/2015 0448   HGB 12.9 12/22/2015   HCT 40 12/22/2015   PLT 127* 12/22/2015   MCV 99.3 09/13/2015 0448   LYMPHSABS 1.1 09/08/2015 2325   MONOABS 0.8 09/08/2015 2325   EOSABS 0.1 09/08/2015 2325   BASOSABS 0.0 09/08/2015 2325    CMP     Component Value Date/Time   NA 141 01/16/2016   NA 138 09/13/2015 0448   K 3.9 01/16/2016   CL 107 09/13/2015 0448   CO2 24 09/13/2015 0448   GLUCOSE 101* 09/13/2015 0448   BUN 11 01/16/2016   BUN 16 09/13/2015 0448   CREATININE 0.6 01/16/2016   CREATININE 0.66 09/13/2015 0448   CALCIUM 8.7* 09/13/2015 0448   PROT 6.9 09/08/2015 2325   ALBUMIN 4.1 09/08/2015 2325   AST 26 09/08/2015 2325   ALT 11* 09/08/2015 2325   ALKPHOS 56 09/08/2015 2325   BILITOT 1.3* 09/08/2015 2325   GFRNONAA >60 09/13/2015 0448   GFRAA >60 09/13/2015 0448    Assessment and Plan #1 earissues with suspected wax buildup in the left ear causing feelings of being plugged in a whistling sensation at times-will treat with D Brox 10 drops twice a day for 3 days and  flush with warm water on day 4 monitor for resolution here.  #2 history of COPD-at this point appears relatively stable she is not complaining of acute cough or shortness of breath although she is a very vulnerable individual in this regards.  #3 history CHF she is on Aldactone as well as Lasix appears to be tolerating this well labs have been minimized secondary to hospice status nonetheless she appears to be doing relatively well again she does have periods where she does not do so well and prognosis remains guarded-.  [id number] note greater than 25 minutes spent assessing patient-discussing her concerns at bedside-reviewing her chart-and coordinating a plan of care.       Kamari Bilek C,

## 2016-03-09 DIAGNOSIS — R609 Edema, unspecified: Secondary | ICD-10-CM | POA: Diagnosis not present

## 2016-03-09 DIAGNOSIS — I10 Essential (primary) hypertension: Secondary | ICD-10-CM | POA: Diagnosis not present

## 2016-03-09 DIAGNOSIS — I519 Heart disease, unspecified: Secondary | ICD-10-CM | POA: Diagnosis not present

## 2016-03-09 DIAGNOSIS — I739 Peripheral vascular disease, unspecified: Secondary | ICD-10-CM | POA: Diagnosis not present

## 2016-03-09 DIAGNOSIS — I509 Heart failure, unspecified: Secondary | ICD-10-CM | POA: Diagnosis not present

## 2016-03-09 DIAGNOSIS — I4891 Unspecified atrial fibrillation: Secondary | ICD-10-CM | POA: Diagnosis not present

## 2016-03-12 DIAGNOSIS — I519 Heart disease, unspecified: Secondary | ICD-10-CM | POA: Diagnosis not present

## 2016-03-12 DIAGNOSIS — I739 Peripheral vascular disease, unspecified: Secondary | ICD-10-CM | POA: Diagnosis not present

## 2016-03-12 DIAGNOSIS — I4891 Unspecified atrial fibrillation: Secondary | ICD-10-CM | POA: Diagnosis not present

## 2016-03-12 DIAGNOSIS — R609 Edema, unspecified: Secondary | ICD-10-CM | POA: Diagnosis not present

## 2016-03-12 DIAGNOSIS — I509 Heart failure, unspecified: Secondary | ICD-10-CM | POA: Diagnosis not present

## 2016-03-12 DIAGNOSIS — I10 Essential (primary) hypertension: Secondary | ICD-10-CM | POA: Diagnosis not present

## 2016-03-13 DIAGNOSIS — R609 Edema, unspecified: Secondary | ICD-10-CM | POA: Diagnosis not present

## 2016-03-13 DIAGNOSIS — I10 Essential (primary) hypertension: Secondary | ICD-10-CM | POA: Diagnosis not present

## 2016-03-13 DIAGNOSIS — I739 Peripheral vascular disease, unspecified: Secondary | ICD-10-CM | POA: Diagnosis not present

## 2016-03-13 DIAGNOSIS — I519 Heart disease, unspecified: Secondary | ICD-10-CM | POA: Diagnosis not present

## 2016-03-13 DIAGNOSIS — I4891 Unspecified atrial fibrillation: Secondary | ICD-10-CM | POA: Diagnosis not present

## 2016-03-13 DIAGNOSIS — I509 Heart failure, unspecified: Secondary | ICD-10-CM | POA: Diagnosis not present

## 2016-03-14 DIAGNOSIS — I519 Heart disease, unspecified: Secondary | ICD-10-CM | POA: Diagnosis not present

## 2016-03-14 DIAGNOSIS — I509 Heart failure, unspecified: Secondary | ICD-10-CM | POA: Diagnosis not present

## 2016-03-14 DIAGNOSIS — I4891 Unspecified atrial fibrillation: Secondary | ICD-10-CM | POA: Diagnosis not present

## 2016-03-14 DIAGNOSIS — I739 Peripheral vascular disease, unspecified: Secondary | ICD-10-CM | POA: Diagnosis not present

## 2016-03-14 DIAGNOSIS — R609 Edema, unspecified: Secondary | ICD-10-CM | POA: Diagnosis not present

## 2016-03-14 DIAGNOSIS — H6120 Impacted cerumen, unspecified ear: Secondary | ICD-10-CM | POA: Insufficient documentation

## 2016-03-14 DIAGNOSIS — I10 Essential (primary) hypertension: Secondary | ICD-10-CM | POA: Diagnosis not present

## 2016-03-16 DIAGNOSIS — I10 Essential (primary) hypertension: Secondary | ICD-10-CM | POA: Diagnosis not present

## 2016-03-16 DIAGNOSIS — I509 Heart failure, unspecified: Secondary | ICD-10-CM | POA: Diagnosis not present

## 2016-03-16 DIAGNOSIS — I739 Peripheral vascular disease, unspecified: Secondary | ICD-10-CM | POA: Diagnosis not present

## 2016-03-16 DIAGNOSIS — I519 Heart disease, unspecified: Secondary | ICD-10-CM | POA: Diagnosis not present

## 2016-03-16 DIAGNOSIS — I4891 Unspecified atrial fibrillation: Secondary | ICD-10-CM | POA: Diagnosis not present

## 2016-03-16 DIAGNOSIS — R609 Edema, unspecified: Secondary | ICD-10-CM | POA: Diagnosis not present

## 2016-03-19 DIAGNOSIS — J449 Chronic obstructive pulmonary disease, unspecified: Secondary | ICD-10-CM | POA: Diagnosis not present

## 2016-03-19 DIAGNOSIS — F339 Major depressive disorder, recurrent, unspecified: Secondary | ICD-10-CM | POA: Diagnosis not present

## 2016-03-19 DIAGNOSIS — E559 Vitamin D deficiency, unspecified: Secondary | ICD-10-CM | POA: Diagnosis not present

## 2016-03-19 DIAGNOSIS — I519 Heart disease, unspecified: Secondary | ICD-10-CM | POA: Diagnosis not present

## 2016-03-19 DIAGNOSIS — K219 Gastro-esophageal reflux disease without esophagitis: Secondary | ICD-10-CM | POA: Diagnosis not present

## 2016-03-19 DIAGNOSIS — H11149 Conjunctival xerosis, unspecified, unspecified eye: Secondary | ICD-10-CM | POA: Diagnosis not present

## 2016-03-19 DIAGNOSIS — N390 Urinary tract infection, site not specified: Secondary | ICD-10-CM | POA: Diagnosis not present

## 2016-03-19 DIAGNOSIS — R0902 Hypoxemia: Secondary | ICD-10-CM | POA: Diagnosis not present

## 2016-03-19 DIAGNOSIS — I10 Essential (primary) hypertension: Secondary | ICD-10-CM | POA: Diagnosis not present

## 2016-03-19 DIAGNOSIS — R609 Edema, unspecified: Secondary | ICD-10-CM | POA: Diagnosis not present

## 2016-03-19 DIAGNOSIS — I509 Heart failure, unspecified: Secondary | ICD-10-CM | POA: Diagnosis not present

## 2016-03-19 DIAGNOSIS — I4891 Unspecified atrial fibrillation: Secondary | ICD-10-CM | POA: Diagnosis not present

## 2016-03-19 DIAGNOSIS — H409 Unspecified glaucoma: Secondary | ICD-10-CM | POA: Diagnosis not present

## 2016-03-19 DIAGNOSIS — I739 Peripheral vascular disease, unspecified: Secondary | ICD-10-CM | POA: Diagnosis not present

## 2016-03-21 DIAGNOSIS — I509 Heart failure, unspecified: Secondary | ICD-10-CM | POA: Diagnosis not present

## 2016-03-21 DIAGNOSIS — I4891 Unspecified atrial fibrillation: Secondary | ICD-10-CM | POA: Diagnosis not present

## 2016-03-21 DIAGNOSIS — I10 Essential (primary) hypertension: Secondary | ICD-10-CM | POA: Diagnosis not present

## 2016-03-21 DIAGNOSIS — I739 Peripheral vascular disease, unspecified: Secondary | ICD-10-CM | POA: Diagnosis not present

## 2016-03-21 DIAGNOSIS — I519 Heart disease, unspecified: Secondary | ICD-10-CM | POA: Diagnosis not present

## 2016-03-21 DIAGNOSIS — R609 Edema, unspecified: Secondary | ICD-10-CM | POA: Diagnosis not present

## 2016-03-23 DIAGNOSIS — I10 Essential (primary) hypertension: Secondary | ICD-10-CM | POA: Diagnosis not present

## 2016-03-23 DIAGNOSIS — I4891 Unspecified atrial fibrillation: Secondary | ICD-10-CM | POA: Diagnosis not present

## 2016-03-23 DIAGNOSIS — I519 Heart disease, unspecified: Secondary | ICD-10-CM | POA: Diagnosis not present

## 2016-03-23 DIAGNOSIS — I739 Peripheral vascular disease, unspecified: Secondary | ICD-10-CM | POA: Diagnosis not present

## 2016-03-23 DIAGNOSIS — I509 Heart failure, unspecified: Secondary | ICD-10-CM | POA: Diagnosis not present

## 2016-03-23 DIAGNOSIS — R609 Edema, unspecified: Secondary | ICD-10-CM | POA: Diagnosis not present

## 2016-03-26 DIAGNOSIS — I519 Heart disease, unspecified: Secondary | ICD-10-CM | POA: Diagnosis not present

## 2016-03-26 DIAGNOSIS — I10 Essential (primary) hypertension: Secondary | ICD-10-CM | POA: Diagnosis not present

## 2016-03-26 DIAGNOSIS — R609 Edema, unspecified: Secondary | ICD-10-CM | POA: Diagnosis not present

## 2016-03-26 DIAGNOSIS — I4891 Unspecified atrial fibrillation: Secondary | ICD-10-CM | POA: Diagnosis not present

## 2016-03-26 DIAGNOSIS — I739 Peripheral vascular disease, unspecified: Secondary | ICD-10-CM | POA: Diagnosis not present

## 2016-03-26 DIAGNOSIS — I509 Heart failure, unspecified: Secondary | ICD-10-CM | POA: Diagnosis not present

## 2016-03-27 ENCOUNTER — Non-Acute Institutional Stay (SKILLED_NURSING_FACILITY): Payer: Medicare Other | Admitting: Internal Medicine

## 2016-03-27 ENCOUNTER — Encounter: Payer: Self-pay | Admitting: Internal Medicine

## 2016-03-27 DIAGNOSIS — I509 Heart failure, unspecified: Secondary | ICD-10-CM

## 2016-03-27 DIAGNOSIS — I110 Hypertensive heart disease with heart failure: Secondary | ICD-10-CM | POA: Diagnosis not present

## 2016-03-27 DIAGNOSIS — I4891 Unspecified atrial fibrillation: Secondary | ICD-10-CM | POA: Diagnosis not present

## 2016-03-27 DIAGNOSIS — I739 Peripheral vascular disease, unspecified: Secondary | ICD-10-CM | POA: Diagnosis not present

## 2016-03-27 DIAGNOSIS — I519 Heart disease, unspecified: Secondary | ICD-10-CM | POA: Diagnosis not present

## 2016-03-27 DIAGNOSIS — R609 Edema, unspecified: Secondary | ICD-10-CM | POA: Diagnosis not present

## 2016-03-27 DIAGNOSIS — I10 Essential (primary) hypertension: Secondary | ICD-10-CM | POA: Diagnosis not present

## 2016-03-27 NOTE — Progress Notes (Addendum)
MRN: [id number] Name: Mariah [name]  Sex: female Age: 82 y.o. DOB: 30-Dec-1918  [place] #: [facility] Facility/Room:213-W Level Of Care: SNF Provider: [name] D Emergency Contacts: Extended Emergency Contact Information Primary Emergency Contact: [name],[name] Address: [address]          [place], [place] [address] [name] of [place] Phone: [phone] Relation: Daughter  Code Status: DNR  Allergies: Lyrica and Penicillins  Chief Complaint  Patient presents with  . Medical Management of Chronic Issues    HPI: Patient is 82 y.o. female with dementia with some FTT earlier whose family had decided against minimal intervention per MOM's wishes who tell me she's had the best month in a long time.  Daughter commented on decreased edema. Pt is sitting up in her WC entertaining. Pt is being seen for routine issues of HTN, CHF and AF.  Past Medical History  Diagnosis Date  . COPD (chronic obstructive pulmonary disease) ([place])   . Atrial fibrillation ([place])   . Hypertension   . CHF (congestive heart failure) [facility])     Past Surgical History  Procedure Laterality Date  . Hernia repair    . Femur im nail Left 09/09/2015    Procedure: INTRAMEDULLARY (IM) NAIL FEMORAL;  Surgeon: Rod Can, MD;  Location: [facility];  Service: Orthopedics;  Laterality: Left;      Medication List       This list is accurate as of: 03/27/16 10:14 PM.  Always use your most recent med list.               acetaminophen 325 MG tablet  Commonly known as:  TYLENOL  Take 650 mg by mouth every 4 (four) hours as needed for moderate pain.     aspirin 325 MG tablet  Take 325 mg by mouth daily.     bisacodyl 5 MG EC tablet  Commonly known as:  DULCOLAX  Take 10 mg by mouth daily as needed for moderate constipation. Reported on 02/14/2016     brimonidine 0.1 % Soln  Commonly known as:  ALPHAGAN P  Place 1 drop into both eyes 2 (two) times daily.     ENSURE PLUS Liqd  Take 1 Can by mouth  2 (two) times daily. Offer at bedtime and in between meals     furosemide 40 MG tablet  Commonly known as:  LASIX  Take 40 mg by mouth daily.     gabapentin 100 MG capsule  Commonly known as:  NEURONTIN  Take 100 mg by mouth at bedtime.     guaiFENesin 600 MG 12 hr tablet  Commonly known as:  MUCINEX  Take 600 mg by mouth 2 (two) times daily.     guaiFENesin 100 MG/5ML liquid  Commonly known as:  ROBITUSSIN  Take 200 mg by mouth every 4 (four) hours as needed for cough.     HYDROcodone-acetaminophen 5-325 MG tablet  Commonly known as:  NORCO/VICODIN  Take 1 tablet by mouth every 4 (four) hours as needed for moderate pain.     ipratropium 0.02 % nebulizer solution  Commonly known as:  ATROVENT  Take 0.5 mg by nebulization every 6 (six) hours as needed for wheezing or shortness of breath.     latanoprost 0.005 % ophthalmic solution  Commonly known as:  XALATAN  Place 1 drop into both eyes at bedtime.     Melatonin 1 MG Tabs  Take 1 mg by mouth at bedtime.     metoprolol succinate 50 MG  24 hr tablet  Commonly known as:  TOPROL-XL  Take 50 mg by mouth daily. Take with or immediately following a meal.     mirtazapine 30 MG tablet  Commonly known as:  REMERON  Take 30 mg by mouth at bedtime.     pantoprazole 40 MG tablet  Commonly known as:  PROTONIX  Take 40 mg by mouth daily.     potassium chloride 10 MEQ CR capsule  Commonly known as:  MICRO-K  Take 10 mEq by mouth daily.     spironolactone 12.5 mg Tabs tablet  Commonly known as:  ALDACTONE  Take 6.25 mg by mouth daily.     traMADol 50 MG tablet  Commonly known as:  ULTRAM  Take 50 mg by mouth at bedtime.     Vitamin D3 5000 units Caps  Take 5,000 Units by mouth daily.        No orders of the defined types were placed in this encounter.    Immunization History  Administered Date(s) Administered  . PPD Test 11/17/2015    Social History  Substance Use Topics  . Smoking status: Never Smoker   .  Smokeless tobacco: Not on file  . Alcohol Use: No    Review of Systems  UTO pt 2/2 dementia ; per nursing no concerns;per daughter decreased edema, good month   Filed Vitals:   03/27/16 1450  BP: 130/85  Pulse: 62  Temp: 97.3 F (36.3 C)  Resp: 18    Physical Exam  GENERAL APPEARANCE: Alert, conversant, No acute distress sittin in WC SKIN: No diaphoresis rash, or wounds HEENT: Unremarkable RESPIRATORY: Breathing is even, unlabored. Lung sounds are clear   CARDIOVASCULAR: Heart RRR no murmurs, rubs or gallops. 1+ peripheral edema , improved GASTROINTESTINAL: Abdomen is soft, non-tender, not distended w/ normal bowel sounds.  GENITOURINARY: Bladder non tender, not distended  MUSCULOSKELETAL: No abnormal joints or musculature NEUROLOGIC: Cranial nerves 2-12 grossly intact. Moves all extremities PSYCHIATRIC: Mood and affect appropriate to situation with dementia, no behavioral issues  Patient Active Problem List   Diagnosis Date Noted  . Cerumen debris on tympanic membrane 03/14/2016  . PVD (peripheral vascular disease) ([place]) 01/24/2016  . Dementia without behavioral disturbance 01/01/2016  . Encounter for family conference with patient present 01/01/2016  . Aspiration of food 12/17/2015  . Speech abnormality 11/29/2015  . Senile purpura ([place]) 11/29/2015  . Hip pain, acute 11/06/2015  . Acute encephalopathy 11/06/2015  . Bilateral lower extremity edema 11/06/2015  . UTI (urinary tract infection) 11/06/2015  . Chronic respiratory failure with hypoxia ([place]) 11/06/2015  . Closed left hip fracture ([place]) 09/09/2015  . CHF (congestive heart failure) ([place]) 09/09/2015  . COPD (chronic obstructive pulmonary disease) ([place])   . Atrial fibrillation ([place])   . Hypertensive heart disease with CHF (congestive heart failure) ([place])   . Chronic bronchitis ([place])   . Essential hypertension   . Fall   . Closed pertrochanteric fracture ([facility])     CBC    Component Value Date/Time   WBC  4.0 12/22/2015   WBC 9.0 09/13/2015 0448   RBC 2.89* 09/13/2015 0448   HGB 12.9 12/22/2015   HCT 40 12/22/2015   PLT 127* 12/22/2015   MCV 99.3 09/13/2015 0448   LYMPHSABS 1.1 09/08/2015 2325   MONOABS 0.8 09/08/2015 2325   EOSABS 0.1 09/08/2015 2325   BASOSABS 0.0 09/08/2015 2325    CMP     Component Value Date/Time   NA 141 01/16/2016   NA 138  09/13/2015 0448   K 3.9 01/16/2016   CL 107 09/13/2015 0448   CO2 24 09/13/2015 0448   GLUCOSE 101* 09/13/2015 0448   BUN 11 01/16/2016   BUN 16 09/13/2015 0448   CREATININE 0.6 01/16/2016   CREATININE 0.66 09/13/2015 0448   CALCIUM 8.7* 09/13/2015 0448   PROT 6.9 09/08/2015 2325   ALBUMIN 4.1 09/08/2015 2325   AST 26 09/08/2015 2325   ALT 11* 09/08/2015 2325   ALKPHOS 56 09/08/2015 2325   BILITOT 1.3* 09/08/2015 2325   GFRNONAA >60 09/13/2015 0448   GFRAA >60 09/13/2015 0448    Assessment and Plan  Hypertensive heart disease with CHF (congestive heart failure) (HCC) Controlled ; cont lasix 40 mg daily spironolactone 6.25 mg daily and toprol XL 50 mg daily  CHF (congestive heart failure) ([facility]) No exacerbations, pedal edema better; cont toprol XL 50 mg spironolactone and lasix 40 mg daily  Atrial fibrillation (HCC) Rate controlled with toprol XL 50 mg daily and prophylaxis with ASA; cont both    [name]  MD

## 2016-03-27 NOTE — Assessment & Plan Note (Signed)
Rate controlled with toprol XL 50 mg daily and prophylaxis with ASA; cont both

## 2016-03-27 NOTE — Assessment & Plan Note (Signed)
No exacerbations, pedal edema better; cont toprol XL 50 mg spironolactone and lasix 40 mg daily

## 2016-03-27 NOTE — Assessment & Plan Note (Signed)
Controlled ; cont lasix 40 mg daily spironolactone 6.25 mg daily and toprol XL 50 mg daily

## 2016-03-28 DIAGNOSIS — I509 Heart failure, unspecified: Secondary | ICD-10-CM | POA: Diagnosis not present

## 2016-03-28 DIAGNOSIS — I519 Heart disease, unspecified: Secondary | ICD-10-CM | POA: Diagnosis not present

## 2016-03-28 DIAGNOSIS — R609 Edema, unspecified: Secondary | ICD-10-CM | POA: Diagnosis not present

## 2016-03-28 DIAGNOSIS — I4891 Unspecified atrial fibrillation: Secondary | ICD-10-CM | POA: Diagnosis not present

## 2016-03-28 DIAGNOSIS — I10 Essential (primary) hypertension: Secondary | ICD-10-CM | POA: Diagnosis not present

## 2016-03-28 DIAGNOSIS — I739 Peripheral vascular disease, unspecified: Secondary | ICD-10-CM | POA: Diagnosis not present

## 2016-03-29 DIAGNOSIS — R609 Edema, unspecified: Secondary | ICD-10-CM | POA: Diagnosis not present

## 2016-03-29 DIAGNOSIS — I10 Essential (primary) hypertension: Secondary | ICD-10-CM | POA: Diagnosis not present

## 2016-03-29 DIAGNOSIS — I509 Heart failure, unspecified: Secondary | ICD-10-CM | POA: Diagnosis not present

## 2016-03-29 DIAGNOSIS — I519 Heart disease, unspecified: Secondary | ICD-10-CM | POA: Diagnosis not present

## 2016-03-29 DIAGNOSIS — I4891 Unspecified atrial fibrillation: Secondary | ICD-10-CM | POA: Diagnosis not present

## 2016-03-29 DIAGNOSIS — I739 Peripheral vascular disease, unspecified: Secondary | ICD-10-CM | POA: Diagnosis not present

## 2016-03-30 DIAGNOSIS — I739 Peripheral vascular disease, unspecified: Secondary | ICD-10-CM | POA: Diagnosis not present

## 2016-03-30 DIAGNOSIS — I4891 Unspecified atrial fibrillation: Secondary | ICD-10-CM | POA: Diagnosis not present

## 2016-03-30 DIAGNOSIS — R609 Edema, unspecified: Secondary | ICD-10-CM | POA: Diagnosis not present

## 2016-03-30 DIAGNOSIS — I519 Heart disease, unspecified: Secondary | ICD-10-CM | POA: Diagnosis not present

## 2016-03-30 DIAGNOSIS — I509 Heart failure, unspecified: Secondary | ICD-10-CM | POA: Diagnosis not present

## 2016-03-30 DIAGNOSIS — I10 Essential (primary) hypertension: Secondary | ICD-10-CM | POA: Diagnosis not present

## 2016-04-01 DIAGNOSIS — I4891 Unspecified atrial fibrillation: Secondary | ICD-10-CM | POA: Diagnosis not present

## 2016-04-01 DIAGNOSIS — R609 Edema, unspecified: Secondary | ICD-10-CM | POA: Diagnosis not present

## 2016-04-01 DIAGNOSIS — I519 Heart disease, unspecified: Secondary | ICD-10-CM | POA: Diagnosis not present

## 2016-04-01 DIAGNOSIS — I10 Essential (primary) hypertension: Secondary | ICD-10-CM | POA: Diagnosis not present

## 2016-04-01 DIAGNOSIS — I739 Peripheral vascular disease, unspecified: Secondary | ICD-10-CM | POA: Diagnosis not present

## 2016-04-01 DIAGNOSIS — I509 Heart failure, unspecified: Secondary | ICD-10-CM | POA: Diagnosis not present

## 2016-04-02 DIAGNOSIS — R609 Edema, unspecified: Secondary | ICD-10-CM | POA: Diagnosis not present

## 2016-04-02 DIAGNOSIS — I10 Essential (primary) hypertension: Secondary | ICD-10-CM | POA: Diagnosis not present

## 2016-04-02 DIAGNOSIS — I509 Heart failure, unspecified: Secondary | ICD-10-CM | POA: Diagnosis not present

## 2016-04-02 DIAGNOSIS — I4891 Unspecified atrial fibrillation: Secondary | ICD-10-CM | POA: Diagnosis not present

## 2016-04-02 DIAGNOSIS — I519 Heart disease, unspecified: Secondary | ICD-10-CM | POA: Diagnosis not present

## 2016-04-02 DIAGNOSIS — I739 Peripheral vascular disease, unspecified: Secondary | ICD-10-CM | POA: Diagnosis not present

## 2016-04-04 DIAGNOSIS — R609 Edema, unspecified: Secondary | ICD-10-CM | POA: Diagnosis not present

## 2016-04-04 DIAGNOSIS — I509 Heart failure, unspecified: Secondary | ICD-10-CM | POA: Diagnosis not present

## 2016-04-04 DIAGNOSIS — I519 Heart disease, unspecified: Secondary | ICD-10-CM | POA: Diagnosis not present

## 2016-04-04 DIAGNOSIS — I10 Essential (primary) hypertension: Secondary | ICD-10-CM | POA: Diagnosis not present

## 2016-04-04 DIAGNOSIS — I4891 Unspecified atrial fibrillation: Secondary | ICD-10-CM | POA: Diagnosis not present

## 2016-04-04 DIAGNOSIS — I739 Peripheral vascular disease, unspecified: Secondary | ICD-10-CM | POA: Diagnosis not present

## 2016-04-05 DIAGNOSIS — I10 Essential (primary) hypertension: Secondary | ICD-10-CM | POA: Diagnosis not present

## 2016-04-05 DIAGNOSIS — R609 Edema, unspecified: Secondary | ICD-10-CM | POA: Diagnosis not present

## 2016-04-05 DIAGNOSIS — I519 Heart disease, unspecified: Secondary | ICD-10-CM | POA: Diagnosis not present

## 2016-04-05 DIAGNOSIS — I509 Heart failure, unspecified: Secondary | ICD-10-CM | POA: Diagnosis not present

## 2016-04-05 DIAGNOSIS — I4891 Unspecified atrial fibrillation: Secondary | ICD-10-CM | POA: Diagnosis not present

## 2016-04-05 DIAGNOSIS — I739 Peripheral vascular disease, unspecified: Secondary | ICD-10-CM | POA: Diagnosis not present

## 2016-04-06 DIAGNOSIS — I509 Heart failure, unspecified: Secondary | ICD-10-CM | POA: Diagnosis not present

## 2016-04-06 DIAGNOSIS — I4891 Unspecified atrial fibrillation: Secondary | ICD-10-CM | POA: Diagnosis not present

## 2016-04-06 DIAGNOSIS — I739 Peripheral vascular disease, unspecified: Secondary | ICD-10-CM | POA: Diagnosis not present

## 2016-04-06 DIAGNOSIS — R609 Edema, unspecified: Secondary | ICD-10-CM | POA: Diagnosis not present

## 2016-04-06 DIAGNOSIS — I519 Heart disease, unspecified: Secondary | ICD-10-CM | POA: Diagnosis not present

## 2016-04-06 DIAGNOSIS — I10 Essential (primary) hypertension: Secondary | ICD-10-CM | POA: Diagnosis not present

## 2016-04-09 DIAGNOSIS — I519 Heart disease, unspecified: Secondary | ICD-10-CM | POA: Diagnosis not present

## 2016-04-09 DIAGNOSIS — R609 Edema, unspecified: Secondary | ICD-10-CM | POA: Diagnosis not present

## 2016-04-09 DIAGNOSIS — I509 Heart failure, unspecified: Secondary | ICD-10-CM | POA: Diagnosis not present

## 2016-04-09 DIAGNOSIS — I4891 Unspecified atrial fibrillation: Secondary | ICD-10-CM | POA: Diagnosis not present

## 2016-04-09 DIAGNOSIS — I739 Peripheral vascular disease, unspecified: Secondary | ICD-10-CM | POA: Diagnosis not present

## 2016-04-09 DIAGNOSIS — I10 Essential (primary) hypertension: Secondary | ICD-10-CM | POA: Diagnosis not present

## 2016-04-11 DIAGNOSIS — R609 Edema, unspecified: Secondary | ICD-10-CM | POA: Diagnosis not present

## 2016-04-11 DIAGNOSIS — I739 Peripheral vascular disease, unspecified: Secondary | ICD-10-CM | POA: Diagnosis not present

## 2016-04-11 DIAGNOSIS — I509 Heart failure, unspecified: Secondary | ICD-10-CM | POA: Diagnosis not present

## 2016-04-11 DIAGNOSIS — I4891 Unspecified atrial fibrillation: Secondary | ICD-10-CM | POA: Diagnosis not present

## 2016-04-11 DIAGNOSIS — I10 Essential (primary) hypertension: Secondary | ICD-10-CM | POA: Diagnosis not present

## 2016-04-11 DIAGNOSIS — I519 Heart disease, unspecified: Secondary | ICD-10-CM | POA: Diagnosis not present

## 2016-04-13 DIAGNOSIS — R609 Edema, unspecified: Secondary | ICD-10-CM | POA: Diagnosis not present

## 2016-04-13 DIAGNOSIS — I509 Heart failure, unspecified: Secondary | ICD-10-CM | POA: Diagnosis not present

## 2016-04-13 DIAGNOSIS — I10 Essential (primary) hypertension: Secondary | ICD-10-CM | POA: Diagnosis not present

## 2016-04-13 DIAGNOSIS — I519 Heart disease, unspecified: Secondary | ICD-10-CM | POA: Diagnosis not present

## 2016-04-13 DIAGNOSIS — I4891 Unspecified atrial fibrillation: Secondary | ICD-10-CM | POA: Diagnosis not present

## 2016-04-13 DIAGNOSIS — I739 Peripheral vascular disease, unspecified: Secondary | ICD-10-CM | POA: Diagnosis not present

## 2016-04-14 DIAGNOSIS — I739 Peripheral vascular disease, unspecified: Secondary | ICD-10-CM | POA: Diagnosis not present

## 2016-04-14 DIAGNOSIS — I4891 Unspecified atrial fibrillation: Secondary | ICD-10-CM | POA: Diagnosis not present

## 2016-04-14 DIAGNOSIS — I519 Heart disease, unspecified: Secondary | ICD-10-CM | POA: Diagnosis not present

## 2016-04-14 DIAGNOSIS — I10 Essential (primary) hypertension: Secondary | ICD-10-CM | POA: Diagnosis not present

## 2016-04-14 DIAGNOSIS — I509 Heart failure, unspecified: Secondary | ICD-10-CM | POA: Diagnosis not present

## 2016-04-14 DIAGNOSIS — R609 Edema, unspecified: Secondary | ICD-10-CM | POA: Diagnosis not present

## 2016-04-16 DIAGNOSIS — I739 Peripheral vascular disease, unspecified: Secondary | ICD-10-CM | POA: Diagnosis not present

## 2016-04-16 DIAGNOSIS — I10 Essential (primary) hypertension: Secondary | ICD-10-CM | POA: Diagnosis not present

## 2016-04-16 DIAGNOSIS — I509 Heart failure, unspecified: Secondary | ICD-10-CM | POA: Diagnosis not present

## 2016-04-16 DIAGNOSIS — I4891 Unspecified atrial fibrillation: Secondary | ICD-10-CM | POA: Diagnosis not present

## 2016-04-16 DIAGNOSIS — R609 Edema, unspecified: Secondary | ICD-10-CM | POA: Diagnosis not present

## 2016-04-16 DIAGNOSIS — I519 Heart disease, unspecified: Secondary | ICD-10-CM | POA: Diagnosis not present

## 2016-04-18 DIAGNOSIS — I10 Essential (primary) hypertension: Secondary | ICD-10-CM | POA: Diagnosis not present

## 2016-04-18 DIAGNOSIS — I519 Heart disease, unspecified: Secondary | ICD-10-CM | POA: Diagnosis not present

## 2016-04-18 DIAGNOSIS — I509 Heart failure, unspecified: Secondary | ICD-10-CM | POA: Diagnosis not present

## 2016-04-18 DIAGNOSIS — I4891 Unspecified atrial fibrillation: Secondary | ICD-10-CM | POA: Diagnosis not present

## 2016-04-18 DIAGNOSIS — I739 Peripheral vascular disease, unspecified: Secondary | ICD-10-CM | POA: Diagnosis not present

## 2016-04-18 DIAGNOSIS — R609 Edema, unspecified: Secondary | ICD-10-CM | POA: Diagnosis not present

## 2016-04-19 DIAGNOSIS — R609 Edema, unspecified: Secondary | ICD-10-CM | POA: Diagnosis not present

## 2016-04-19 DIAGNOSIS — J449 Chronic obstructive pulmonary disease, unspecified: Secondary | ICD-10-CM | POA: Diagnosis not present

## 2016-04-19 DIAGNOSIS — I739 Peripheral vascular disease, unspecified: Secondary | ICD-10-CM | POA: Diagnosis not present

## 2016-04-19 DIAGNOSIS — H11149 Conjunctival xerosis, unspecified, unspecified eye: Secondary | ICD-10-CM | POA: Diagnosis not present

## 2016-04-19 DIAGNOSIS — K219 Gastro-esophageal reflux disease without esophagitis: Secondary | ICD-10-CM | POA: Diagnosis not present

## 2016-04-19 DIAGNOSIS — H409 Unspecified glaucoma: Secondary | ICD-10-CM | POA: Diagnosis not present

## 2016-04-19 DIAGNOSIS — I509 Heart failure, unspecified: Secondary | ICD-10-CM | POA: Diagnosis not present

## 2016-04-19 DIAGNOSIS — F339 Major depressive disorder, recurrent, unspecified: Secondary | ICD-10-CM | POA: Diagnosis not present

## 2016-04-19 DIAGNOSIS — E559 Vitamin D deficiency, unspecified: Secondary | ICD-10-CM | POA: Diagnosis not present

## 2016-04-19 DIAGNOSIS — N390 Urinary tract infection, site not specified: Secondary | ICD-10-CM | POA: Diagnosis not present

## 2016-04-19 DIAGNOSIS — I10 Essential (primary) hypertension: Secondary | ICD-10-CM | POA: Diagnosis not present

## 2016-04-19 DIAGNOSIS — R0902 Hypoxemia: Secondary | ICD-10-CM | POA: Diagnosis not present

## 2016-04-19 DIAGNOSIS — I519 Heart disease, unspecified: Secondary | ICD-10-CM | POA: Diagnosis not present

## 2016-04-19 DIAGNOSIS — I4891 Unspecified atrial fibrillation: Secondary | ICD-10-CM | POA: Diagnosis not present

## 2016-04-20 DIAGNOSIS — I4891 Unspecified atrial fibrillation: Secondary | ICD-10-CM | POA: Diagnosis not present

## 2016-04-20 DIAGNOSIS — I10 Essential (primary) hypertension: Secondary | ICD-10-CM | POA: Diagnosis not present

## 2016-04-20 DIAGNOSIS — R609 Edema, unspecified: Secondary | ICD-10-CM | POA: Diagnosis not present

## 2016-04-20 DIAGNOSIS — I509 Heart failure, unspecified: Secondary | ICD-10-CM | POA: Diagnosis not present

## 2016-04-20 DIAGNOSIS — I519 Heart disease, unspecified: Secondary | ICD-10-CM | POA: Diagnosis not present

## 2016-04-20 DIAGNOSIS — I739 Peripheral vascular disease, unspecified: Secondary | ICD-10-CM | POA: Diagnosis not present

## 2016-04-23 DIAGNOSIS — I4891 Unspecified atrial fibrillation: Secondary | ICD-10-CM | POA: Diagnosis not present

## 2016-04-23 DIAGNOSIS — I10 Essential (primary) hypertension: Secondary | ICD-10-CM | POA: Diagnosis not present

## 2016-04-23 DIAGNOSIS — I739 Peripheral vascular disease, unspecified: Secondary | ICD-10-CM | POA: Diagnosis not present

## 2016-04-23 DIAGNOSIS — I519 Heart disease, unspecified: Secondary | ICD-10-CM | POA: Diagnosis not present

## 2016-04-23 DIAGNOSIS — R609 Edema, unspecified: Secondary | ICD-10-CM | POA: Diagnosis not present

## 2016-04-23 DIAGNOSIS — I509 Heart failure, unspecified: Secondary | ICD-10-CM | POA: Diagnosis not present

## 2016-04-24 DIAGNOSIS — I509 Heart failure, unspecified: Secondary | ICD-10-CM | POA: Diagnosis not present

## 2016-04-24 DIAGNOSIS — I519 Heart disease, unspecified: Secondary | ICD-10-CM | POA: Diagnosis not present

## 2016-04-24 DIAGNOSIS — I4891 Unspecified atrial fibrillation: Secondary | ICD-10-CM | POA: Diagnosis not present

## 2016-04-24 DIAGNOSIS — R609 Edema, unspecified: Secondary | ICD-10-CM | POA: Diagnosis not present

## 2016-04-24 DIAGNOSIS — I739 Peripheral vascular disease, unspecified: Secondary | ICD-10-CM | POA: Diagnosis not present

## 2016-04-24 DIAGNOSIS — I10 Essential (primary) hypertension: Secondary | ICD-10-CM | POA: Diagnosis not present

## 2016-04-25 DIAGNOSIS — I519 Heart disease, unspecified: Secondary | ICD-10-CM | POA: Diagnosis not present

## 2016-04-25 DIAGNOSIS — I4891 Unspecified atrial fibrillation: Secondary | ICD-10-CM | POA: Diagnosis not present

## 2016-04-25 DIAGNOSIS — R609 Edema, unspecified: Secondary | ICD-10-CM | POA: Diagnosis not present

## 2016-04-25 DIAGNOSIS — I10 Essential (primary) hypertension: Secondary | ICD-10-CM | POA: Diagnosis not present

## 2016-04-25 DIAGNOSIS — I739 Peripheral vascular disease, unspecified: Secondary | ICD-10-CM | POA: Diagnosis not present

## 2016-04-25 DIAGNOSIS — I509 Heart failure, unspecified: Secondary | ICD-10-CM | POA: Diagnosis not present

## 2016-04-27 DIAGNOSIS — I519 Heart disease, unspecified: Secondary | ICD-10-CM | POA: Diagnosis not present

## 2016-04-27 DIAGNOSIS — R609 Edema, unspecified: Secondary | ICD-10-CM | POA: Diagnosis not present

## 2016-04-27 DIAGNOSIS — I10 Essential (primary) hypertension: Secondary | ICD-10-CM | POA: Diagnosis not present

## 2016-04-27 DIAGNOSIS — I4891 Unspecified atrial fibrillation: Secondary | ICD-10-CM | POA: Diagnosis not present

## 2016-04-27 DIAGNOSIS — I509 Heart failure, unspecified: Secondary | ICD-10-CM | POA: Diagnosis not present

## 2016-04-27 DIAGNOSIS — I739 Peripheral vascular disease, unspecified: Secondary | ICD-10-CM | POA: Diagnosis not present

## 2016-04-29 DIAGNOSIS — I739 Peripheral vascular disease, unspecified: Secondary | ICD-10-CM | POA: Diagnosis not present

## 2016-04-29 DIAGNOSIS — I10 Essential (primary) hypertension: Secondary | ICD-10-CM | POA: Diagnosis not present

## 2016-04-29 DIAGNOSIS — R609 Edema, unspecified: Secondary | ICD-10-CM | POA: Diagnosis not present

## 2016-04-29 DIAGNOSIS — I4891 Unspecified atrial fibrillation: Secondary | ICD-10-CM | POA: Diagnosis not present

## 2016-04-29 DIAGNOSIS — I519 Heart disease, unspecified: Secondary | ICD-10-CM | POA: Diagnosis not present

## 2016-04-29 DIAGNOSIS — I509 Heart failure, unspecified: Secondary | ICD-10-CM | POA: Diagnosis not present

## 2016-04-30 DIAGNOSIS — I509 Heart failure, unspecified: Secondary | ICD-10-CM | POA: Diagnosis not present

## 2016-04-30 DIAGNOSIS — R609 Edema, unspecified: Secondary | ICD-10-CM | POA: Diagnosis not present

## 2016-04-30 DIAGNOSIS — I739 Peripheral vascular disease, unspecified: Secondary | ICD-10-CM | POA: Diagnosis not present

## 2016-04-30 DIAGNOSIS — I10 Essential (primary) hypertension: Secondary | ICD-10-CM | POA: Diagnosis not present

## 2016-04-30 DIAGNOSIS — I4891 Unspecified atrial fibrillation: Secondary | ICD-10-CM | POA: Diagnosis not present

## 2016-04-30 DIAGNOSIS — I519 Heart disease, unspecified: Secondary | ICD-10-CM | POA: Diagnosis not present

## 2016-05-01 DIAGNOSIS — I509 Heart failure, unspecified: Secondary | ICD-10-CM | POA: Diagnosis not present

## 2016-05-01 DIAGNOSIS — I519 Heart disease, unspecified: Secondary | ICD-10-CM | POA: Diagnosis not present

## 2016-05-01 DIAGNOSIS — I739 Peripheral vascular disease, unspecified: Secondary | ICD-10-CM | POA: Diagnosis not present

## 2016-05-01 DIAGNOSIS — I10 Essential (primary) hypertension: Secondary | ICD-10-CM | POA: Diagnosis not present

## 2016-05-01 DIAGNOSIS — I4891 Unspecified atrial fibrillation: Secondary | ICD-10-CM | POA: Diagnosis not present

## 2016-05-01 DIAGNOSIS — R609 Edema, unspecified: Secondary | ICD-10-CM | POA: Diagnosis not present

## 2016-05-02 DIAGNOSIS — I519 Heart disease, unspecified: Secondary | ICD-10-CM | POA: Diagnosis not present

## 2016-05-02 DIAGNOSIS — I4891 Unspecified atrial fibrillation: Secondary | ICD-10-CM | POA: Diagnosis not present

## 2016-05-02 DIAGNOSIS — I10 Essential (primary) hypertension: Secondary | ICD-10-CM | POA: Diagnosis not present

## 2016-05-02 DIAGNOSIS — I509 Heart failure, unspecified: Secondary | ICD-10-CM | POA: Diagnosis not present

## 2016-05-02 DIAGNOSIS — R609 Edema, unspecified: Secondary | ICD-10-CM | POA: Diagnosis not present

## 2016-05-02 DIAGNOSIS — I739 Peripheral vascular disease, unspecified: Secondary | ICD-10-CM | POA: Diagnosis not present

## 2016-05-03 DIAGNOSIS — R609 Edema, unspecified: Secondary | ICD-10-CM | POA: Diagnosis not present

## 2016-05-03 DIAGNOSIS — I739 Peripheral vascular disease, unspecified: Secondary | ICD-10-CM | POA: Diagnosis not present

## 2016-05-03 DIAGNOSIS — I4891 Unspecified atrial fibrillation: Secondary | ICD-10-CM | POA: Diagnosis not present

## 2016-05-03 DIAGNOSIS — I519 Heart disease, unspecified: Secondary | ICD-10-CM | POA: Diagnosis not present

## 2016-05-03 DIAGNOSIS — I509 Heart failure, unspecified: Secondary | ICD-10-CM | POA: Diagnosis not present

## 2016-05-03 DIAGNOSIS — I10 Essential (primary) hypertension: Secondary | ICD-10-CM | POA: Diagnosis not present

## 2016-05-04 DIAGNOSIS — I509 Heart failure, unspecified: Secondary | ICD-10-CM | POA: Diagnosis not present

## 2016-05-04 DIAGNOSIS — I519 Heart disease, unspecified: Secondary | ICD-10-CM | POA: Diagnosis not present

## 2016-05-04 DIAGNOSIS — I739 Peripheral vascular disease, unspecified: Secondary | ICD-10-CM | POA: Diagnosis not present

## 2016-05-04 DIAGNOSIS — I10 Essential (primary) hypertension: Secondary | ICD-10-CM | POA: Diagnosis not present

## 2016-05-04 DIAGNOSIS — R609 Edema, unspecified: Secondary | ICD-10-CM | POA: Diagnosis not present

## 2016-05-04 DIAGNOSIS — I4891 Unspecified atrial fibrillation: Secondary | ICD-10-CM | POA: Diagnosis not present

## 2016-05-07 DIAGNOSIS — R609 Edema, unspecified: Secondary | ICD-10-CM | POA: Diagnosis not present

## 2016-05-07 DIAGNOSIS — I739 Peripheral vascular disease, unspecified: Secondary | ICD-10-CM | POA: Diagnosis not present

## 2016-05-07 DIAGNOSIS — I519 Heart disease, unspecified: Secondary | ICD-10-CM | POA: Diagnosis not present

## 2016-05-07 DIAGNOSIS — I509 Heart failure, unspecified: Secondary | ICD-10-CM | POA: Diagnosis not present

## 2016-05-07 DIAGNOSIS — I4891 Unspecified atrial fibrillation: Secondary | ICD-10-CM | POA: Diagnosis not present

## 2016-05-07 DIAGNOSIS — I10 Essential (primary) hypertension: Secondary | ICD-10-CM | POA: Diagnosis not present

## 2016-05-08 DIAGNOSIS — R609 Edema, unspecified: Secondary | ICD-10-CM | POA: Diagnosis not present

## 2016-05-08 DIAGNOSIS — I739 Peripheral vascular disease, unspecified: Secondary | ICD-10-CM | POA: Diagnosis not present

## 2016-05-08 DIAGNOSIS — I4891 Unspecified atrial fibrillation: Secondary | ICD-10-CM | POA: Diagnosis not present

## 2016-05-08 DIAGNOSIS — I509 Heart failure, unspecified: Secondary | ICD-10-CM | POA: Diagnosis not present

## 2016-05-08 DIAGNOSIS — I10 Essential (primary) hypertension: Secondary | ICD-10-CM | POA: Diagnosis not present

## 2016-05-08 DIAGNOSIS — I519 Heart disease, unspecified: Secondary | ICD-10-CM | POA: Diagnosis not present

## 2016-05-09 DIAGNOSIS — I739 Peripheral vascular disease, unspecified: Secondary | ICD-10-CM | POA: Diagnosis not present

## 2016-05-09 DIAGNOSIS — M79672 Pain in left foot: Secondary | ICD-10-CM | POA: Diagnosis not present

## 2016-05-09 DIAGNOSIS — M79671 Pain in right foot: Secondary | ICD-10-CM | POA: Diagnosis not present

## 2016-05-09 DIAGNOSIS — I509 Heart failure, unspecified: Secondary | ICD-10-CM | POA: Diagnosis not present

## 2016-05-09 DIAGNOSIS — R609 Edema, unspecified: Secondary | ICD-10-CM | POA: Diagnosis not present

## 2016-05-09 DIAGNOSIS — I4891 Unspecified atrial fibrillation: Secondary | ICD-10-CM | POA: Diagnosis not present

## 2016-05-09 DIAGNOSIS — I10 Essential (primary) hypertension: Secondary | ICD-10-CM | POA: Diagnosis not present

## 2016-05-09 DIAGNOSIS — I519 Heart disease, unspecified: Secondary | ICD-10-CM | POA: Diagnosis not present

## 2016-05-09 DIAGNOSIS — B351 Tinea unguium: Secondary | ICD-10-CM | POA: Diagnosis not present

## 2016-05-10 DIAGNOSIS — I519 Heart disease, unspecified: Secondary | ICD-10-CM | POA: Diagnosis not present

## 2016-05-10 DIAGNOSIS — R609 Edema, unspecified: Secondary | ICD-10-CM | POA: Diagnosis not present

## 2016-05-10 DIAGNOSIS — I4891 Unspecified atrial fibrillation: Secondary | ICD-10-CM | POA: Diagnosis not present

## 2016-05-10 DIAGNOSIS — I739 Peripheral vascular disease, unspecified: Secondary | ICD-10-CM | POA: Diagnosis not present

## 2016-05-10 DIAGNOSIS — I10 Essential (primary) hypertension: Secondary | ICD-10-CM | POA: Diagnosis not present

## 2016-05-10 DIAGNOSIS — I509 Heart failure, unspecified: Secondary | ICD-10-CM | POA: Diagnosis not present

## 2016-05-11 DIAGNOSIS — I509 Heart failure, unspecified: Secondary | ICD-10-CM | POA: Diagnosis not present

## 2016-05-11 DIAGNOSIS — I4891 Unspecified atrial fibrillation: Secondary | ICD-10-CM | POA: Diagnosis not present

## 2016-05-11 DIAGNOSIS — R609 Edema, unspecified: Secondary | ICD-10-CM | POA: Diagnosis not present

## 2016-05-11 DIAGNOSIS — I519 Heart disease, unspecified: Secondary | ICD-10-CM | POA: Diagnosis not present

## 2016-05-11 DIAGNOSIS — I739 Peripheral vascular disease, unspecified: Secondary | ICD-10-CM | POA: Diagnosis not present

## 2016-05-11 DIAGNOSIS — I10 Essential (primary) hypertension: Secondary | ICD-10-CM | POA: Diagnosis not present

## 2016-05-14 DIAGNOSIS — I739 Peripheral vascular disease, unspecified: Secondary | ICD-10-CM | POA: Diagnosis not present

## 2016-05-14 DIAGNOSIS — R609 Edema, unspecified: Secondary | ICD-10-CM | POA: Diagnosis not present

## 2016-05-14 DIAGNOSIS — I10 Essential (primary) hypertension: Secondary | ICD-10-CM | POA: Diagnosis not present

## 2016-05-14 DIAGNOSIS — I519 Heart disease, unspecified: Secondary | ICD-10-CM | POA: Diagnosis not present

## 2016-05-14 DIAGNOSIS — I4891 Unspecified atrial fibrillation: Secondary | ICD-10-CM | POA: Diagnosis not present

## 2016-05-14 DIAGNOSIS — I509 Heart failure, unspecified: Secondary | ICD-10-CM | POA: Diagnosis not present

## 2016-05-15 DIAGNOSIS — I4891 Unspecified atrial fibrillation: Secondary | ICD-10-CM | POA: Diagnosis not present

## 2016-05-15 DIAGNOSIS — I509 Heart failure, unspecified: Secondary | ICD-10-CM | POA: Diagnosis not present

## 2016-05-15 DIAGNOSIS — R609 Edema, unspecified: Secondary | ICD-10-CM | POA: Diagnosis not present

## 2016-05-15 DIAGNOSIS — I519 Heart disease, unspecified: Secondary | ICD-10-CM | POA: Diagnosis not present

## 2016-05-15 DIAGNOSIS — I10 Essential (primary) hypertension: Secondary | ICD-10-CM | POA: Diagnosis not present

## 2016-05-15 DIAGNOSIS — I739 Peripheral vascular disease, unspecified: Secondary | ICD-10-CM | POA: Diagnosis not present

## 2016-05-16 DIAGNOSIS — I739 Peripheral vascular disease, unspecified: Secondary | ICD-10-CM | POA: Diagnosis not present

## 2016-05-16 DIAGNOSIS — I10 Essential (primary) hypertension: Secondary | ICD-10-CM | POA: Diagnosis not present

## 2016-05-16 DIAGNOSIS — I509 Heart failure, unspecified: Secondary | ICD-10-CM | POA: Diagnosis not present

## 2016-05-16 DIAGNOSIS — I4891 Unspecified atrial fibrillation: Secondary | ICD-10-CM | POA: Diagnosis not present

## 2016-05-16 DIAGNOSIS — R609 Edema, unspecified: Secondary | ICD-10-CM | POA: Diagnosis not present

## 2016-05-16 DIAGNOSIS — I519 Heart disease, unspecified: Secondary | ICD-10-CM | POA: Diagnosis not present

## 2016-05-17 DIAGNOSIS — R609 Edema, unspecified: Secondary | ICD-10-CM | POA: Diagnosis not present

## 2016-05-17 DIAGNOSIS — I4891 Unspecified atrial fibrillation: Secondary | ICD-10-CM | POA: Diagnosis not present

## 2016-05-17 DIAGNOSIS — I739 Peripheral vascular disease, unspecified: Secondary | ICD-10-CM | POA: Diagnosis not present

## 2016-05-17 DIAGNOSIS — I10 Essential (primary) hypertension: Secondary | ICD-10-CM | POA: Diagnosis not present

## 2016-05-17 DIAGNOSIS — I509 Heart failure, unspecified: Secondary | ICD-10-CM | POA: Diagnosis not present

## 2016-05-17 DIAGNOSIS — I519 Heart disease, unspecified: Secondary | ICD-10-CM | POA: Diagnosis not present

## 2016-05-18 DIAGNOSIS — I4891 Unspecified atrial fibrillation: Secondary | ICD-10-CM | POA: Diagnosis not present

## 2016-05-18 DIAGNOSIS — I739 Peripheral vascular disease, unspecified: Secondary | ICD-10-CM | POA: Diagnosis not present

## 2016-05-18 DIAGNOSIS — I519 Heart disease, unspecified: Secondary | ICD-10-CM | POA: Diagnosis not present

## 2016-05-18 DIAGNOSIS — I509 Heart failure, unspecified: Secondary | ICD-10-CM | POA: Diagnosis not present

## 2016-05-18 DIAGNOSIS — R609 Edema, unspecified: Secondary | ICD-10-CM | POA: Diagnosis not present

## 2016-05-18 DIAGNOSIS — I10 Essential (primary) hypertension: Secondary | ICD-10-CM | POA: Diagnosis not present

## 2016-05-19 DIAGNOSIS — H409 Unspecified glaucoma: Secondary | ICD-10-CM | POA: Diagnosis not present

## 2016-05-19 DIAGNOSIS — K219 Gastro-esophageal reflux disease without esophagitis: Secondary | ICD-10-CM | POA: Diagnosis not present

## 2016-05-19 DIAGNOSIS — J449 Chronic obstructive pulmonary disease, unspecified: Secondary | ICD-10-CM | POA: Diagnosis not present

## 2016-05-19 DIAGNOSIS — H11149 Conjunctival xerosis, unspecified, unspecified eye: Secondary | ICD-10-CM | POA: Diagnosis not present

## 2016-05-19 DIAGNOSIS — I739 Peripheral vascular disease, unspecified: Secondary | ICD-10-CM | POA: Diagnosis not present

## 2016-05-19 DIAGNOSIS — R609 Edema, unspecified: Secondary | ICD-10-CM | POA: Diagnosis not present

## 2016-05-19 DIAGNOSIS — I509 Heart failure, unspecified: Secondary | ICD-10-CM | POA: Diagnosis not present

## 2016-05-19 DIAGNOSIS — I519 Heart disease, unspecified: Secondary | ICD-10-CM | POA: Diagnosis not present

## 2016-05-19 DIAGNOSIS — E559 Vitamin D deficiency, unspecified: Secondary | ICD-10-CM | POA: Diagnosis not present

## 2016-05-19 DIAGNOSIS — R0902 Hypoxemia: Secondary | ICD-10-CM | POA: Diagnosis not present

## 2016-05-19 DIAGNOSIS — I4891 Unspecified atrial fibrillation: Secondary | ICD-10-CM | POA: Diagnosis not present

## 2016-05-19 DIAGNOSIS — I10 Essential (primary) hypertension: Secondary | ICD-10-CM | POA: Diagnosis not present

## 2016-05-19 DIAGNOSIS — F339 Major depressive disorder, recurrent, unspecified: Secondary | ICD-10-CM | POA: Diagnosis not present

## 2016-05-19 DIAGNOSIS — N390 Urinary tract infection, site not specified: Secondary | ICD-10-CM | POA: Diagnosis not present

## 2016-05-21 DIAGNOSIS — I509 Heart failure, unspecified: Secondary | ICD-10-CM | POA: Diagnosis not present

## 2016-05-21 DIAGNOSIS — I4891 Unspecified atrial fibrillation: Secondary | ICD-10-CM | POA: Diagnosis not present

## 2016-05-21 DIAGNOSIS — I10 Essential (primary) hypertension: Secondary | ICD-10-CM | POA: Diagnosis not present

## 2016-05-21 DIAGNOSIS — I519 Heart disease, unspecified: Secondary | ICD-10-CM | POA: Diagnosis not present

## 2016-05-21 DIAGNOSIS — R609 Edema, unspecified: Secondary | ICD-10-CM | POA: Diagnosis not present

## 2016-05-21 DIAGNOSIS — I739 Peripheral vascular disease, unspecified: Secondary | ICD-10-CM | POA: Diagnosis not present

## 2016-05-23 DIAGNOSIS — I509 Heart failure, unspecified: Secondary | ICD-10-CM | POA: Diagnosis not present

## 2016-05-23 DIAGNOSIS — I519 Heart disease, unspecified: Secondary | ICD-10-CM | POA: Diagnosis not present

## 2016-05-23 DIAGNOSIS — R609 Edema, unspecified: Secondary | ICD-10-CM | POA: Diagnosis not present

## 2016-05-23 DIAGNOSIS — I739 Peripheral vascular disease, unspecified: Secondary | ICD-10-CM | POA: Diagnosis not present

## 2016-05-23 DIAGNOSIS — I10 Essential (primary) hypertension: Secondary | ICD-10-CM | POA: Diagnosis not present

## 2016-05-23 DIAGNOSIS — I4891 Unspecified atrial fibrillation: Secondary | ICD-10-CM | POA: Diagnosis not present

## 2016-05-24 DIAGNOSIS — R609 Edema, unspecified: Secondary | ICD-10-CM | POA: Diagnosis not present

## 2016-05-24 DIAGNOSIS — I10 Essential (primary) hypertension: Secondary | ICD-10-CM | POA: Diagnosis not present

## 2016-05-24 DIAGNOSIS — I739 Peripheral vascular disease, unspecified: Secondary | ICD-10-CM | POA: Diagnosis not present

## 2016-05-24 DIAGNOSIS — I519 Heart disease, unspecified: Secondary | ICD-10-CM | POA: Diagnosis not present

## 2016-05-24 DIAGNOSIS — I509 Heart failure, unspecified: Secondary | ICD-10-CM | POA: Diagnosis not present

## 2016-05-24 DIAGNOSIS — I4891 Unspecified atrial fibrillation: Secondary | ICD-10-CM | POA: Diagnosis not present

## 2016-05-25 DIAGNOSIS — R609 Edema, unspecified: Secondary | ICD-10-CM | POA: Diagnosis not present

## 2016-05-25 DIAGNOSIS — I739 Peripheral vascular disease, unspecified: Secondary | ICD-10-CM | POA: Diagnosis not present

## 2016-05-25 DIAGNOSIS — I4891 Unspecified atrial fibrillation: Secondary | ICD-10-CM | POA: Diagnosis not present

## 2016-05-25 DIAGNOSIS — I10 Essential (primary) hypertension: Secondary | ICD-10-CM | POA: Diagnosis not present

## 2016-05-25 DIAGNOSIS — I519 Heart disease, unspecified: Secondary | ICD-10-CM | POA: Diagnosis not present

## 2016-05-25 DIAGNOSIS — I509 Heart failure, unspecified: Secondary | ICD-10-CM | POA: Diagnosis not present

## 2016-05-27 DIAGNOSIS — I509 Heart failure, unspecified: Secondary | ICD-10-CM | POA: Diagnosis not present

## 2016-05-27 DIAGNOSIS — I739 Peripheral vascular disease, unspecified: Secondary | ICD-10-CM | POA: Diagnosis not present

## 2016-05-27 DIAGNOSIS — I10 Essential (primary) hypertension: Secondary | ICD-10-CM | POA: Diagnosis not present

## 2016-05-27 DIAGNOSIS — I519 Heart disease, unspecified: Secondary | ICD-10-CM | POA: Diagnosis not present

## 2016-05-27 DIAGNOSIS — I4891 Unspecified atrial fibrillation: Secondary | ICD-10-CM | POA: Diagnosis not present

## 2016-05-27 DIAGNOSIS — R609 Edema, unspecified: Secondary | ICD-10-CM | POA: Diagnosis not present

## 2016-05-28 DIAGNOSIS — I4891 Unspecified atrial fibrillation: Secondary | ICD-10-CM | POA: Diagnosis not present

## 2016-05-28 DIAGNOSIS — I739 Peripheral vascular disease, unspecified: Secondary | ICD-10-CM | POA: Diagnosis not present

## 2016-05-28 DIAGNOSIS — I10 Essential (primary) hypertension: Secondary | ICD-10-CM | POA: Diagnosis not present

## 2016-05-28 DIAGNOSIS — I509 Heart failure, unspecified: Secondary | ICD-10-CM | POA: Diagnosis not present

## 2016-05-28 DIAGNOSIS — R609 Edema, unspecified: Secondary | ICD-10-CM | POA: Diagnosis not present

## 2016-05-28 DIAGNOSIS — I519 Heart disease, unspecified: Secondary | ICD-10-CM | POA: Diagnosis not present

## 2016-05-30 ENCOUNTER — Encounter: Payer: Self-pay | Admitting: Internal Medicine

## 2016-05-30 ENCOUNTER — Non-Acute Institutional Stay (SKILLED_NURSING_FACILITY): Payer: Medicare Other | Admitting: Internal Medicine

## 2016-05-30 DIAGNOSIS — J9611 Chronic respiratory failure with hypoxia: Secondary | ICD-10-CM

## 2016-05-30 DIAGNOSIS — R609 Edema, unspecified: Secondary | ICD-10-CM | POA: Diagnosis not present

## 2016-05-30 DIAGNOSIS — I739 Peripheral vascular disease, unspecified: Secondary | ICD-10-CM | POA: Diagnosis not present

## 2016-05-30 DIAGNOSIS — K219 Gastro-esophageal reflux disease without esophagitis: Secondary | ICD-10-CM

## 2016-05-30 DIAGNOSIS — J42 Unspecified chronic bronchitis: Secondary | ICD-10-CM | POA: Diagnosis not present

## 2016-05-30 DIAGNOSIS — I519 Heart disease, unspecified: Secondary | ICD-10-CM | POA: Diagnosis not present

## 2016-05-30 DIAGNOSIS — I4891 Unspecified atrial fibrillation: Secondary | ICD-10-CM | POA: Diagnosis not present

## 2016-05-30 DIAGNOSIS — I10 Essential (primary) hypertension: Secondary | ICD-10-CM | POA: Diagnosis not present

## 2016-05-30 DIAGNOSIS — I509 Heart failure, unspecified: Secondary | ICD-10-CM | POA: Diagnosis not present

## 2016-05-30 NOTE — Progress Notes (Signed)
MRN: [id number] Name: Mariah [name]  Sex: female Age: 82 y.o. DOB: 1919-07-07  [place] #:  Facility/Room: [facility] / [facility]: SNF Provider: Noah Delaine. Sheppard Coil, MD Emergency Contacts: Extended Emergency Contact Information Primary Emergency Contact: [name],[name] Address: [address]          [place], [place] [address] [name] of [place] Phone: [phone] Relation: Daughter  Code Status: DNR  Allergies: Lyrica [pregabalin] and Penicillins  Chief Complaint  Patient presents with  . Medical Management of Chronic Issues    Routine Visit    HPI: Patient is 82 y.o. female who   Past Medical History:  Diagnosis Date  . Atrial fibrillation ([place])   . CHF (congestive heart failure) ([place])   . COPD (chronic obstructive pulmonary disease) ([place])   . Glaucoma   . Hypertension     Past Surgical History:  Procedure Laterality Date  . FEMUR IM NAIL Left 09/09/2015   Procedure: INTRAMEDULLARY (IM) NAIL FEMORAL;  Surgeon: Rod Can, MD;  Location: [facility];  Service: Orthopedics;  Laterality: Left;  . HERNIA REPAIR        Medication List       Accurate as of 05/30/16 11:59 PM. Always use your most recent med list.          acetaminophen 325 MG tablet Commonly known as:  TYLENOL Take 650 mg by mouth every 4 (four) hours as needed for moderate pain.   aspirin 325 MG EC tablet Take 325 mg by mouth daily.   bisacodyl 5 MG EC tablet Commonly known as:  DULCOLAX Take 10 mg by mouth daily as needed for moderate constipation. Reported on 02/14/2016   brimonidine 0.1 % Soln Commonly known as:  ALPHAGAN P Place 1 drop into both eyes 2 (two) times daily.   ENSURE PLUS Liqd Take 1 Can by mouth 2 (two) times daily between meals. Offer at bedtime and in between meals   furosemide 40 MG tablet Commonly known as:  LASIX Take 40 mg by mouth daily.   gabapentin 100 MG capsule Commonly known as:  NEURONTIN Take 100 mg by mouth at bedtime.   guaiFENesin 600  MG 12 hr tablet Commonly known as:  MUCINEX Take 600 mg by mouth 2 (two) times daily.   guaiFENesin 100 MG/5ML liquid Commonly known as:  ROBITUSSIN Give 10 cc by mouth every 4 hours as needed for cough   HYDROcodone-acetaminophen 5-325 MG tablet Commonly known as:  NORCO/VICODIN Take 1 tablet by mouth every 4 (four) hours as needed for moderate pain or severe pain. No more than 3 grams of Tylenol in 24 hours   ipratropium 0.02 % nebulizer solution Commonly known as:  ATROVENT Take 0.5 mg by nebulization every 6 (six) hours as needed for wheezing or shortness of breath.   latanoprost 0.005 % ophthalmic solution Commonly known as:  XALATAN Place 1 drop into both eyes at bedtime.   Melatonin 1 MG Tabs Take 1 mg by mouth at bedtime.   metoprolol succinate 50 MG 24 hr tablet Commonly known as:  TOPROL-XL Take 50 mg by mouth daily. Take with or immediately following a meal.   mirtazapine 30 MG tablet Commonly known as:  REMERON Take 30 mg by mouth at bedtime.   OXYGEN Inhale 2 L into the lungs as needed. for dyspnea   pantoprazole 40 MG tablet Commonly known as:  PROTONIX Take 40 mg by mouth daily.   polyethylene glycol packet Commonly known as:  MIRALAX / GLYCOLAX  Take 17 g by mouth every other day.   potassium chloride 10 MEQ CR capsule Commonly known as:  MICRO-K Take 10 mEq by mouth daily.   spironolactone 12.5 mg Tabs tablet Commonly known as:  ALDACTONE Take 6.25 mg by mouth daily.   traMADol 50 MG tablet Commonly known as:  ULTRAM Take 50 mg by mouth at bedtime.   Vitamin D3 5000 units Caps Take 5,000 Units by mouth daily.       Meds ordered this encounter  Medications  . polyethylene glycol (MIRALAX / GLYCOLAX) packet    Sig: Take 17 g by mouth every other day.  . OXYGEN    Sig: Inhale 2 L into the lungs as needed. for dyspnea  . aspirin 325 MG EC tablet    Sig: Take 325 mg by mouth daily.    Immunization History  Administered Date(s)  Administered  . PPD Test 10/31/2015, 11/17/2015    Social History  Substance Use Topics  . Smoking status: Never Smoker  . Smokeless tobacco: Never Used  . Alcohol use No    Review of Systems  DATA OBTAINED: from patient, nurse GENERAL:  no fevers, fatigue, appetite changes SKIN: No itching, rash HEENT: No complaint RESPIRATORY: No cough, wheezing, SOB CARDIAC: No chest pain, palpitations, lower extremity edema  GI: No abdominal pain, No N/V/D or constipation, No heartburn or reflux  GU: No dysuria, frequency or urgency, or incontinence  MUSCULOSKELETAL: No unrelieved bone/joint pain NEUROLOGIC: No headache, dizziness  PSYCHIATRIC: No overt anxiety or sadness  Vitals:   05/30/16 1104  BP: 105/65  Pulse: 62  Resp: 20  Temp: 98.5 F (36.9 C)    Physical Exam  GENERAL APPEARANCE: Alert, conversant, No acute distress  SKIN: No diaphoresis rash HEENT: Unremarkable RESPIRATORY: Breathing is even, unlabored. Lung sounds are diffusely decreased, no wheezing CARDIOVASCULAR: Heart RRR no murmurs, rubs or gallops. No peripheral edema  GASTROINTESTINAL: Abdomen is soft, non-tender, not distended w/ normal bowel sounds.  GENITOURINARY: Bladder non tender, not distended  MUSCULOSKELETAL: No abnormal joints or musculature NEUROLOGIC: Cranial nerves 2-12 grossly intact. Moves all extremities PSYCHIATRIC: dementia but a good day, talking with her family sitting up dressed in a chair, no behavioral issues  Patient Active Problem List   Diagnosis Date Noted  . GERD (gastroesophageal reflux disease) 06/10/2016  . Cerumen debris on tympanic membrane 03/14/2016  . PVD (peripheral vascular disease) ([place]) 01/24/2016  . Dementia without behavioral disturbance 01/01/2016  . Encounter for family conference with patient present 01/01/2016  . Aspiration of food 12/17/2015  . Speech abnormality 11/29/2015  . Senile purpura ([place]) 11/29/2015  . Hip pain, acute 11/06/2015  . Acute  encephalopathy 11/06/2015  . Bilateral lower extremity edema 11/06/2015  . UTI (urinary tract infection) 11/06/2015  . Chronic respiratory failure with hypoxia ([place]) 11/06/2015  . Closed left hip fracture ([place]) 09/09/2015  . CHF (congestive heart failure) ([place]) 09/09/2015  . COPD (chronic obstructive pulmonary disease) ([place])   . Atrial fibrillation ([place])   . Hypertensive heart disease with CHF (congestive heart failure) ([place])   . Chronic bronchitis ([place])   . Essential hypertension   . Fall   . Closed pertrochanteric fracture ([facility])     CBC    Component Value Date/Time   WBC 4.0 12/22/2015   WBC 9.0 09/13/2015 0448   RBC 2.89 (L) 09/13/2015 0448   HGB 12.9 12/22/2015   HCT 40 12/22/2015   PLT 127 (A) 12/22/2015   MCV 99.3 09/13/2015 0448   LYMPHSABS  1.1 09/08/2015 2325   MONOABS 0.8 09/08/2015 2325   EOSABS 0.1 09/08/2015 2325   BASOSABS 0.0 09/08/2015 2325    CMP     Component Value Date/Time   NA 141 01/16/2016   K 3.9 01/16/2016   CL 107 09/13/2015 0448   CO2 24 09/13/2015 0448   GLUCOSE 101 (H) 09/13/2015 0448   BUN 11 01/16/2016   CREATININE 0.6 01/16/2016   CREATININE 0.66 09/13/2015 0448   CALCIUM 8.7 (L) 09/13/2015 0448   PROT 6.9 09/08/2015 2325   ALBUMIN 4.1 09/08/2015 2325   AST 26 09/08/2015 2325   ALT 11 (L) 09/08/2015 2325   ALKPHOS 56 09/08/2015 2325   BILITOT 1.3 (H) 09/08/2015 2325   GFRNONAA >60 09/13/2015 0448   GFRAA >60 09/13/2015 0448    Assessment and Plan  GERD - Stable, no reflux or aspiration;cont protonix 40 mg daily  CHRONIC RESP FAILURE WITH HYPOXIA - stable, cont chronic o2 2L  COPD- no exacerbation; pt is O2 dependent and has been very stable; cont O2    Jad Johansson D. Sheppard Coil, MD

## 2016-05-31 DIAGNOSIS — I739 Peripheral vascular disease, unspecified: Secondary | ICD-10-CM | POA: Diagnosis not present

## 2016-05-31 DIAGNOSIS — I509 Heart failure, unspecified: Secondary | ICD-10-CM | POA: Diagnosis not present

## 2016-05-31 DIAGNOSIS — I519 Heart disease, unspecified: Secondary | ICD-10-CM | POA: Diagnosis not present

## 2016-05-31 DIAGNOSIS — I4891 Unspecified atrial fibrillation: Secondary | ICD-10-CM | POA: Diagnosis not present

## 2016-05-31 DIAGNOSIS — R609 Edema, unspecified: Secondary | ICD-10-CM | POA: Diagnosis not present

## 2016-05-31 DIAGNOSIS — I10 Essential (primary) hypertension: Secondary | ICD-10-CM | POA: Diagnosis not present

## 2016-06-01 DIAGNOSIS — I519 Heart disease, unspecified: Secondary | ICD-10-CM | POA: Diagnosis not present

## 2016-06-01 DIAGNOSIS — I509 Heart failure, unspecified: Secondary | ICD-10-CM | POA: Diagnosis not present

## 2016-06-01 DIAGNOSIS — I4891 Unspecified atrial fibrillation: Secondary | ICD-10-CM | POA: Diagnosis not present

## 2016-06-01 DIAGNOSIS — R609 Edema, unspecified: Secondary | ICD-10-CM | POA: Diagnosis not present

## 2016-06-01 DIAGNOSIS — I739 Peripheral vascular disease, unspecified: Secondary | ICD-10-CM | POA: Diagnosis not present

## 2016-06-01 DIAGNOSIS — I10 Essential (primary) hypertension: Secondary | ICD-10-CM | POA: Diagnosis not present

## 2016-06-04 DIAGNOSIS — I739 Peripheral vascular disease, unspecified: Secondary | ICD-10-CM | POA: Diagnosis not present

## 2016-06-04 DIAGNOSIS — I4891 Unspecified atrial fibrillation: Secondary | ICD-10-CM | POA: Diagnosis not present

## 2016-06-04 DIAGNOSIS — I509 Heart failure, unspecified: Secondary | ICD-10-CM | POA: Diagnosis not present

## 2016-06-04 DIAGNOSIS — R609 Edema, unspecified: Secondary | ICD-10-CM | POA: Diagnosis not present

## 2016-06-04 DIAGNOSIS — I519 Heart disease, unspecified: Secondary | ICD-10-CM | POA: Diagnosis not present

## 2016-06-04 DIAGNOSIS — I10 Essential (primary) hypertension: Secondary | ICD-10-CM | POA: Diagnosis not present

## 2016-06-05 DIAGNOSIS — I509 Heart failure, unspecified: Secondary | ICD-10-CM | POA: Diagnosis not present

## 2016-06-05 DIAGNOSIS — I519 Heart disease, unspecified: Secondary | ICD-10-CM | POA: Diagnosis not present

## 2016-06-05 DIAGNOSIS — I739 Peripheral vascular disease, unspecified: Secondary | ICD-10-CM | POA: Diagnosis not present

## 2016-06-05 DIAGNOSIS — I4891 Unspecified atrial fibrillation: Secondary | ICD-10-CM | POA: Diagnosis not present

## 2016-06-05 DIAGNOSIS — I10 Essential (primary) hypertension: Secondary | ICD-10-CM | POA: Diagnosis not present

## 2016-06-05 DIAGNOSIS — R609 Edema, unspecified: Secondary | ICD-10-CM | POA: Diagnosis not present

## 2016-06-06 DIAGNOSIS — I4891 Unspecified atrial fibrillation: Secondary | ICD-10-CM | POA: Diagnosis not present

## 2016-06-06 DIAGNOSIS — I509 Heart failure, unspecified: Secondary | ICD-10-CM | POA: Diagnosis not present

## 2016-06-06 DIAGNOSIS — I739 Peripheral vascular disease, unspecified: Secondary | ICD-10-CM | POA: Diagnosis not present

## 2016-06-06 DIAGNOSIS — I10 Essential (primary) hypertension: Secondary | ICD-10-CM | POA: Diagnosis not present

## 2016-06-06 DIAGNOSIS — I519 Heart disease, unspecified: Secondary | ICD-10-CM | POA: Diagnosis not present

## 2016-06-06 DIAGNOSIS — R609 Edema, unspecified: Secondary | ICD-10-CM | POA: Diagnosis not present

## 2016-06-07 DIAGNOSIS — I739 Peripheral vascular disease, unspecified: Secondary | ICD-10-CM | POA: Diagnosis not present

## 2016-06-07 DIAGNOSIS — I509 Heart failure, unspecified: Secondary | ICD-10-CM | POA: Diagnosis not present

## 2016-06-07 DIAGNOSIS — I4891 Unspecified atrial fibrillation: Secondary | ICD-10-CM | POA: Diagnosis not present

## 2016-06-07 DIAGNOSIS — I519 Heart disease, unspecified: Secondary | ICD-10-CM | POA: Diagnosis not present

## 2016-06-07 DIAGNOSIS — R609 Edema, unspecified: Secondary | ICD-10-CM | POA: Diagnosis not present

## 2016-06-07 DIAGNOSIS — I10 Essential (primary) hypertension: Secondary | ICD-10-CM | POA: Diagnosis not present

## 2016-06-08 DIAGNOSIS — I10 Essential (primary) hypertension: Secondary | ICD-10-CM | POA: Diagnosis not present

## 2016-06-08 DIAGNOSIS — I4891 Unspecified atrial fibrillation: Secondary | ICD-10-CM | POA: Diagnosis not present

## 2016-06-08 DIAGNOSIS — I509 Heart failure, unspecified: Secondary | ICD-10-CM | POA: Diagnosis not present

## 2016-06-08 DIAGNOSIS — I739 Peripheral vascular disease, unspecified: Secondary | ICD-10-CM | POA: Diagnosis not present

## 2016-06-08 DIAGNOSIS — R609 Edema, unspecified: Secondary | ICD-10-CM | POA: Diagnosis not present

## 2016-06-08 DIAGNOSIS — I519 Heart disease, unspecified: Secondary | ICD-10-CM | POA: Diagnosis not present

## 2016-06-10 ENCOUNTER — Encounter: Payer: Self-pay | Admitting: Internal Medicine

## 2016-06-10 DIAGNOSIS — K219 Gastro-esophageal reflux disease without esophagitis: Secondary | ICD-10-CM | POA: Insufficient documentation

## 2016-06-10 HISTORY — DX: Gastro-esophageal reflux disease without esophagitis: K21.9

## 2016-06-10 NOTE — Assessment & Plan Note (Deleted)
2/2 endstage COPD; has been stable; cont O2 2L

## 2016-06-10 NOTE — Assessment & Plan Note (Signed)
Pt is O2 dependent and has been very stable;cont O2

## 2016-06-10 NOTE — Assessment & Plan Note (Signed)
Stable, no reflux or aspiration;cont protonix 40 mg daily

## 2016-06-11 DIAGNOSIS — I509 Heart failure, unspecified: Secondary | ICD-10-CM | POA: Diagnosis not present

## 2016-06-11 DIAGNOSIS — I739 Peripheral vascular disease, unspecified: Secondary | ICD-10-CM | POA: Diagnosis not present

## 2016-06-11 DIAGNOSIS — I10 Essential (primary) hypertension: Secondary | ICD-10-CM | POA: Diagnosis not present

## 2016-06-11 DIAGNOSIS — R609 Edema, unspecified: Secondary | ICD-10-CM | POA: Diagnosis not present

## 2016-06-11 DIAGNOSIS — I519 Heart disease, unspecified: Secondary | ICD-10-CM | POA: Diagnosis not present

## 2016-06-11 DIAGNOSIS — I4891 Unspecified atrial fibrillation: Secondary | ICD-10-CM | POA: Diagnosis not present

## 2016-06-13 DIAGNOSIS — I739 Peripheral vascular disease, unspecified: Secondary | ICD-10-CM | POA: Diagnosis not present

## 2016-06-13 DIAGNOSIS — I4891 Unspecified atrial fibrillation: Secondary | ICD-10-CM | POA: Diagnosis not present

## 2016-06-13 DIAGNOSIS — I10 Essential (primary) hypertension: Secondary | ICD-10-CM | POA: Diagnosis not present

## 2016-06-13 DIAGNOSIS — R609 Edema, unspecified: Secondary | ICD-10-CM | POA: Diagnosis not present

## 2016-06-13 DIAGNOSIS — I509 Heart failure, unspecified: Secondary | ICD-10-CM | POA: Diagnosis not present

## 2016-06-13 DIAGNOSIS — I519 Heart disease, unspecified: Secondary | ICD-10-CM | POA: Diagnosis not present

## 2016-06-15 DIAGNOSIS — I519 Heart disease, unspecified: Secondary | ICD-10-CM | POA: Diagnosis not present

## 2016-06-15 DIAGNOSIS — I509 Heart failure, unspecified: Secondary | ICD-10-CM | POA: Diagnosis not present

## 2016-06-15 DIAGNOSIS — I10 Essential (primary) hypertension: Secondary | ICD-10-CM | POA: Diagnosis not present

## 2016-06-15 DIAGNOSIS — I4891 Unspecified atrial fibrillation: Secondary | ICD-10-CM | POA: Diagnosis not present

## 2016-06-15 DIAGNOSIS — I739 Peripheral vascular disease, unspecified: Secondary | ICD-10-CM | POA: Diagnosis not present

## 2016-06-15 DIAGNOSIS — R609 Edema, unspecified: Secondary | ICD-10-CM | POA: Diagnosis not present

## 2016-06-18 DIAGNOSIS — I10 Essential (primary) hypertension: Secondary | ICD-10-CM | POA: Diagnosis not present

## 2016-06-18 DIAGNOSIS — I509 Heart failure, unspecified: Secondary | ICD-10-CM | POA: Diagnosis not present

## 2016-06-18 DIAGNOSIS — R609 Edema, unspecified: Secondary | ICD-10-CM | POA: Diagnosis not present

## 2016-06-18 DIAGNOSIS — I519 Heart disease, unspecified: Secondary | ICD-10-CM | POA: Diagnosis not present

## 2016-06-18 DIAGNOSIS — I4891 Unspecified atrial fibrillation: Secondary | ICD-10-CM | POA: Diagnosis not present

## 2016-06-18 DIAGNOSIS — I739 Peripheral vascular disease, unspecified: Secondary | ICD-10-CM | POA: Diagnosis not present

## 2016-06-19 DIAGNOSIS — R609 Edema, unspecified: Secondary | ICD-10-CM | POA: Diagnosis not present

## 2016-06-19 DIAGNOSIS — H409 Unspecified glaucoma: Secondary | ICD-10-CM | POA: Diagnosis not present

## 2016-06-19 DIAGNOSIS — N390 Urinary tract infection, site not specified: Secondary | ICD-10-CM | POA: Diagnosis not present

## 2016-06-19 DIAGNOSIS — J449 Chronic obstructive pulmonary disease, unspecified: Secondary | ICD-10-CM | POA: Diagnosis not present

## 2016-06-19 DIAGNOSIS — I10 Essential (primary) hypertension: Secondary | ICD-10-CM | POA: Diagnosis not present

## 2016-06-19 DIAGNOSIS — I4891 Unspecified atrial fibrillation: Secondary | ICD-10-CM | POA: Diagnosis not present

## 2016-06-19 DIAGNOSIS — I739 Peripheral vascular disease, unspecified: Secondary | ICD-10-CM | POA: Diagnosis not present

## 2016-06-19 DIAGNOSIS — K219 Gastro-esophageal reflux disease without esophagitis: Secondary | ICD-10-CM | POA: Diagnosis not present

## 2016-06-19 DIAGNOSIS — H11149 Conjunctival xerosis, unspecified, unspecified eye: Secondary | ICD-10-CM | POA: Diagnosis not present

## 2016-06-19 DIAGNOSIS — I519 Heart disease, unspecified: Secondary | ICD-10-CM | POA: Diagnosis not present

## 2016-06-19 DIAGNOSIS — E559 Vitamin D deficiency, unspecified: Secondary | ICD-10-CM | POA: Diagnosis not present

## 2016-06-19 DIAGNOSIS — I509 Heart failure, unspecified: Secondary | ICD-10-CM | POA: Diagnosis not present

## 2016-06-19 DIAGNOSIS — R0902 Hypoxemia: Secondary | ICD-10-CM | POA: Diagnosis not present

## 2016-06-19 DIAGNOSIS — F339 Major depressive disorder, recurrent, unspecified: Secondary | ICD-10-CM | POA: Diagnosis not present

## 2016-06-20 ENCOUNTER — Encounter: Payer: Self-pay | Admitting: Internal Medicine

## 2016-06-20 ENCOUNTER — Non-Acute Institutional Stay (SKILLED_NURSING_FACILITY): Payer: Medicare Other | Admitting: Internal Medicine

## 2016-06-20 DIAGNOSIS — I739 Peripheral vascular disease, unspecified: Secondary | ICD-10-CM | POA: Diagnosis not present

## 2016-06-20 DIAGNOSIS — F32A Depression, unspecified: Secondary | ICD-10-CM

## 2016-06-20 DIAGNOSIS — F0390 Unspecified dementia without behavioral disturbance: Secondary | ICD-10-CM | POA: Diagnosis not present

## 2016-06-20 DIAGNOSIS — I4891 Unspecified atrial fibrillation: Secondary | ICD-10-CM | POA: Diagnosis not present

## 2016-06-20 DIAGNOSIS — I10 Essential (primary) hypertension: Secondary | ICD-10-CM | POA: Diagnosis not present

## 2016-06-20 DIAGNOSIS — R609 Edema, unspecified: Secondary | ICD-10-CM | POA: Diagnosis not present

## 2016-06-20 DIAGNOSIS — F329 Major depressive disorder, single episode, unspecified: Secondary | ICD-10-CM

## 2016-06-20 DIAGNOSIS — I519 Heart disease, unspecified: Secondary | ICD-10-CM | POA: Diagnosis not present

## 2016-06-20 DIAGNOSIS — I509 Heart failure, unspecified: Secondary | ICD-10-CM | POA: Diagnosis not present

## 2016-06-20 NOTE — Progress Notes (Signed)
MRN: [id number] Name: Mariah [name]  Sex: female Age: 82 y.o. DOB: August 03, 1919  [place] #:  Facility/Room: [facility] [address] [facility]: SNF Provider: Noah Delaine. Sheppard Coil, MD Emergency Contacts: Extended Emergency Contact Information Primary Emergency Contact: [name],[name] Address: [address]          [place], [place] [address] [name] of [place] Phone: [phone] Relation: Daughter  Code Status: DNR  Allergies: Lyrica [pregabalin] and Penicillins  Chief Complaint  Patient presents with  . Medical Management of Chronic Issues    Routine Visit    HPI: Patient is 82 y.o. female who is beng seen for routine issues of PVD, dementia and depression.   Past Medical History:  Diagnosis Date  . Atrial fibrillation ([place])   . CHF (congestive heart failure) ([place])   . COPD (chronic obstructive pulmonary disease) ([place])   . Glaucoma   . Hypertension     Past Surgical History:  Procedure Laterality Date  . FEMUR IM NAIL Left 09/09/2015   Procedure: INTRAMEDULLARY (IM) NAIL FEMORAL;  Surgeon: Rod Can, MD;  Location: [facility];  Service: Orthopedics;  Laterality: Left;  . HERNIA REPAIR        Medication List       Accurate as of 06/20/16 11:59 PM. Always use your most recent med list.          acetaminophen 325 MG tablet Commonly known as:  TYLENOL Take 650 mg by mouth every 4 (four) hours as needed for moderate pain.   aspirin 325 MG EC tablet Take 325 mg by mouth daily.   bisacodyl 5 MG EC tablet Commonly known as:  DULCOLAX Take 10 mg by mouth daily as needed for moderate constipation. Reported on 02/14/2016   brimonidine 0.1 % Soln Commonly known as:  ALPHAGAN P Place 1 drop into both eyes 2 (two) times daily.   ENSURE PLUS Liqd Take 1 Can by mouth 2 (two) times daily between meals. Offer at bedtime and in between meals   furosemide 40 MG tablet Commonly known as:  LASIX Take 40 mg by mouth daily.   gabapentin 100 MG capsule Commonly known as:   NEURONTIN Take 100 mg by mouth at bedtime.   guaiFENesin 600 MG 12 hr tablet Commonly known as:  MUCINEX Take 600 mg by mouth 2 (two) times daily.   guaiFENesin 100 MG/5ML liquid Commonly known as:  ROBITUSSIN Give 10 cc by mouth every 4 hours as needed for cough   HYDROcodone-acetaminophen 5-325 MG tablet Commonly known as:  NORCO/VICODIN Take 1 tablet by mouth every 4 (four) hours as needed for moderate pain or severe pain. No more than 3 grams of Tylenol in 24 hours   ipratropium 0.02 % nebulizer solution Commonly known as:  ATROVENT Take 0.5 mg by nebulization every 6 (six) hours as needed for wheezing or shortness of breath.   latanoprost 0.005 % ophthalmic solution Commonly known as:  XALATAN Place 1 drop into both eyes at bedtime.   Melatonin 1 MG Tabs Take 1 mg by mouth at bedtime.   metoprolol succinate 50 MG 24 hr tablet Commonly known as:  TOPROL-XL Take 50 mg by mouth daily. Take with or immediately following a meal.   mirtazapine 30 MG tablet Commonly known as:  REMERON Take 30 mg by mouth at bedtime.   OXYGEN Inhale 2 L into the lungs as needed. for dyspnea   pantoprazole 40 MG tablet Commonly known as:  PROTONIX Take 40 mg by mouth daily.  polyethylene glycol packet Commonly known as:  MIRALAX / GLYCOLAX Take 17 g by mouth every other day.   potassium chloride 10 MEQ CR capsule Commonly known as:  MICRO-K Take 10 mEq by mouth daily.   spironolactone 12.5 mg Tabs tablet Commonly known as:  ALDACTONE Take 6.25 mg by mouth daily.   traMADol 50 MG tablet Commonly known as:  ULTRAM Take 50 mg by mouth at bedtime.   Vitamin D3 5000 units Caps Take 5,000 Units by mouth daily.       No orders of the defined types were placed in this encounter.   Immunization History  Administered Date(s) Administered  . PPD Test 10/31/2015, 11/17/2015    Social History  Substance Use Topics  . Smoking status: Never Smoker  . Smokeless tobacco: Never  Used  . Alcohol use No    Review of Systems  DATA OBTAINED: from patient[ - pt has no c/o GENERAL:  no fevers, fatigue, appetite changes SKIN: No itching, rash HEENT: No complaint RESPIRATORY: No cough, wheezing, SOB CARDIAC: No chest pain, palpitations, lower extremity edema  GI: No abdominal pain, No N/V/D or constipation, No heartburn or reflux  GU: No dysuria, frequency or urgency, or incontinence  MUSCULOSKELETAL: No unrelieved bone/joint pain NEUROLOGIC: No headache, dizziness  PSYCHIATRIC: No overt anxiety or sadness  Vitals:   06/20/16 1013  BP: 105/65  Pulse: 64  Resp: 16  Temp: 98.5 F (36.9 C)    Physical Exam  GENERAL APPEARANCE: Alert, conversant, No acute distress  SKIN: No diaphoresis rash HEENT: Unremarkable RESPIRATORY: Breathing is even, unlabored. Lung sounds are clear   CARDIOVASCULAR: Heart RRR no murmurs, rubs or gallops. No peripheral edema  GASTROINTESTINAL: Abdomen is soft, non-tender, not distended w/ normal bowel sounds.  GENITOURINARY: Bladder non tender, not distended  MUSCULOSKELETAL: No abnormal joints or musculature NEUROLOGIC: Cranial nerves 2-12 grossly intact. Moves all extremities PSYCHIATRIC: Mood and affect appropriate to situation, no behavioral issues  Patient Active Problem List   Diagnosis Date Noted  . Depression 07/05/2016  . GERD (gastroesophageal reflux disease) 06/10/2016  . Cerumen debris on tympanic membrane 03/14/2016  . PVD (peripheral vascular disease) ([place]) 01/24/2016  . Dementia without behavioral disturbance 01/01/2016  . Encounter for family conference with patient present 01/01/2016  . Aspiration of food 12/17/2015  . Speech abnormality 11/29/2015  . Senile purpura ([place]) 11/29/2015  . Hip pain, acute 11/06/2015  . Acute encephalopathy 11/06/2015  . Bilateral lower extremity edema 11/06/2015  . UTI (urinary tract infection) 11/06/2015  . Chronic respiratory failure with hypoxia ([place]) 11/06/2015  . Closed  left hip fracture ([place]) 09/09/2015  . CHF (congestive heart failure) ([place]) 09/09/2015  . COPD (chronic obstructive pulmonary disease) ([place])   . Atrial fibrillation ([place])   . Hypertensive heart disease with CHF (congestive heart failure) ([place])   . Chronic bronchitis ([place])   . Essential hypertension   . Fall   . Closed pertrochanteric fracture ([facility])     CBC    Component Value Date/Time   WBC 4.0 12/22/2015   WBC 9.0 09/13/2015 0448   RBC 2.89 (L) 09/13/2015 0448   HGB 12.9 12/22/2015   HCT 40 12/22/2015   PLT 127 (A) 12/22/2015   MCV 99.3 09/13/2015 0448   LYMPHSABS 1.1 09/08/2015 2325   MONOABS 0.8 09/08/2015 2325   EOSABS 0.1 09/08/2015 2325   BASOSABS 0.0 09/08/2015 2325    CMP     Component Value Date/Time   NA 141 01/16/2016   K 3.9 01/16/2016  CL 107 09/13/2015 0448   CO2 24 09/13/2015 0448   GLUCOSE 101 (H) 09/13/2015 0448   BUN 11 01/16/2016   CREATININE 0.6 01/16/2016   CREATININE 0.66 09/13/2015 0448   CALCIUM 8.7 (L) 09/13/2015 0448   PROT 6.9 09/08/2015 2325   ALBUMIN 4.1 09/08/2015 2325   AST 26 09/08/2015 2325   ALT 11 (L) 09/08/2015 2325   ALKPHOS 56 09/08/2015 2325   BILITOT 1.3 (H) 09/08/2015 2325   GFRNONAA >60 09/13/2015 0448   GFRAA >60 09/13/2015 0448    Assessment and Plan  PVD (peripheral vascular disease) ([place]) Pt has had no skin breakdowns or wounds; cont ASA 325 mg daily  Dementia without behavioral disturbance Pt continues to do well, dresses , is out of bed, can carry on a prolonged conversation; cont supportive care.  Depression Pt is doing well on remeron 15 mg QHS; will cont   Sarp Vernier D. Sheppard Coil, MD

## 2016-06-21 DIAGNOSIS — I739 Peripheral vascular disease, unspecified: Secondary | ICD-10-CM | POA: Diagnosis not present

## 2016-06-21 DIAGNOSIS — I519 Heart disease, unspecified: Secondary | ICD-10-CM | POA: Diagnosis not present

## 2016-06-21 DIAGNOSIS — I509 Heart failure, unspecified: Secondary | ICD-10-CM | POA: Diagnosis not present

## 2016-06-21 DIAGNOSIS — I4891 Unspecified atrial fibrillation: Secondary | ICD-10-CM | POA: Diagnosis not present

## 2016-06-21 DIAGNOSIS — I10 Essential (primary) hypertension: Secondary | ICD-10-CM | POA: Diagnosis not present

## 2016-06-21 DIAGNOSIS — R609 Edema, unspecified: Secondary | ICD-10-CM | POA: Diagnosis not present

## 2016-06-22 DIAGNOSIS — I4891 Unspecified atrial fibrillation: Secondary | ICD-10-CM | POA: Diagnosis not present

## 2016-06-22 DIAGNOSIS — I10 Essential (primary) hypertension: Secondary | ICD-10-CM | POA: Diagnosis not present

## 2016-06-22 DIAGNOSIS — I519 Heart disease, unspecified: Secondary | ICD-10-CM | POA: Diagnosis not present

## 2016-06-22 DIAGNOSIS — R609 Edema, unspecified: Secondary | ICD-10-CM | POA: Diagnosis not present

## 2016-06-22 DIAGNOSIS — I739 Peripheral vascular disease, unspecified: Secondary | ICD-10-CM | POA: Diagnosis not present

## 2016-06-22 DIAGNOSIS — I509 Heart failure, unspecified: Secondary | ICD-10-CM | POA: Diagnosis not present

## 2016-06-23 DIAGNOSIS — I519 Heart disease, unspecified: Secondary | ICD-10-CM | POA: Diagnosis not present

## 2016-06-23 DIAGNOSIS — R609 Edema, unspecified: Secondary | ICD-10-CM | POA: Diagnosis not present

## 2016-06-23 DIAGNOSIS — I4891 Unspecified atrial fibrillation: Secondary | ICD-10-CM | POA: Diagnosis not present

## 2016-06-23 DIAGNOSIS — I739 Peripheral vascular disease, unspecified: Secondary | ICD-10-CM | POA: Diagnosis not present

## 2016-06-23 DIAGNOSIS — I509 Heart failure, unspecified: Secondary | ICD-10-CM | POA: Diagnosis not present

## 2016-06-23 DIAGNOSIS — I10 Essential (primary) hypertension: Secondary | ICD-10-CM | POA: Diagnosis not present

## 2016-06-25 ENCOUNTER — Encounter: Payer: Self-pay | Admitting: Internal Medicine

## 2016-06-25 ENCOUNTER — Non-Acute Institutional Stay (SKILLED_NURSING_FACILITY): Payer: Medicare Other | Admitting: Internal Medicine

## 2016-06-25 DIAGNOSIS — M79671 Pain in right foot: Secondary | ICD-10-CM

## 2016-06-25 DIAGNOSIS — R609 Edema, unspecified: Secondary | ICD-10-CM | POA: Diagnosis not present

## 2016-06-25 DIAGNOSIS — I739 Peripheral vascular disease, unspecified: Secondary | ICD-10-CM | POA: Diagnosis not present

## 2016-06-25 DIAGNOSIS — I519 Heart disease, unspecified: Secondary | ICD-10-CM | POA: Diagnosis not present

## 2016-06-25 DIAGNOSIS — I4891 Unspecified atrial fibrillation: Secondary | ICD-10-CM | POA: Diagnosis not present

## 2016-06-25 DIAGNOSIS — I509 Heart failure, unspecified: Secondary | ICD-10-CM | POA: Diagnosis not present

## 2016-06-25 DIAGNOSIS — I10 Essential (primary) hypertension: Secondary | ICD-10-CM | POA: Diagnosis not present

## 2016-06-25 NOTE — Progress Notes (Signed)
MRN: [id number] Name: Mariah [name]  Sex: female Age: 82 y.o. DOB: [birth year]  [place] #:  Facility/Room: [facility]: SNF Provider: Noah Delaine. Sheppard Coil, MD Emergency Contacts: Extended Emergency Contact Information Primary Emergency Contact: [name],[name] Address: [address]          [place],  [address] [name] of [place] Phone: [phone] Relation: Daughter  Code Status: DNR  Allergies: Lyrica [pregabalin] and Penicillins  Chief Complaint  Patient presents with  . Acute Visit    Acute    HPI: Patient is 82 y.o. female who nursing asked me to see for heel pain. Pt says that it hurts on and off at night, described as a throbbing pain. Not worse when she walks on it , not better when her feet are elevated or down.  Past Medical History:  Diagnosis Date  . Atrial fibrillation ([place])   . CHF (congestive heart failure) ([place])   . COPD (chronic obstructive pulmonary disease) ([place])   . Glaucoma   . Hypertension     Past Surgical History:  Procedure Laterality Date  . FEMUR IM NAIL Left 09/09/2015   Procedure: INTRAMEDULLARY (IM) NAIL FEMORAL;  Surgeon: Rod Can, MD;  Location: [facility];  Service: Orthopedics;  Laterality: Left;  . HERNIA REPAIR        Medication List       Accurate as of 06/25/16 11:59 PM. Always use your most recent med list.          acetaminophen 325 MG tablet Commonly known as:  TYLENOL Take 650 mg by mouth every 4 (four) hours as needed for moderate pain.   aspirin 325 MG EC tablet Take 325 mg by mouth daily.   bisacodyl 5 MG EC tablet Commonly known as:  DULCOLAX Take 10 mg by mouth daily as needed for moderate constipation. Reported on 02/14/2016   brimonidine 0.1 % Soln Commonly known as:  ALPHAGAN P Place 1 drop into both eyes 2 (two) times daily.   ENSURE PLUS Liqd Take 1 Can by mouth 2 (two) times daily between meals. Offer at bedtime and in between meals   furosemide 40 MG tablet Commonly  known as:  LASIX Take 40 mg by mouth daily.   gabapentin 100 MG capsule Commonly known as:  NEURONTIN Take 100 mg by mouth at bedtime.   guaiFENesin 600 MG 12 hr tablet Commonly known as:  MUCINEX Take 600 mg by mouth 2 (two) times daily.   guaiFENesin 100 MG/5ML liquid Commonly known as:  ROBITUSSIN Give 10 cc by mouth every 4 hours as needed for cough   HYDROcodone-acetaminophen 5-325 MG tablet Commonly known as:  NORCO/VICODIN Take 1 tablet by mouth every 4 (four) hours as needed for moderate pain or severe pain. No more than 3 grams of Tylenol in 24 hours   ipratropium 0.02 % nebulizer solution Commonly known as:  ATROVENT Take 0.5 mg by nebulization every 6 (six) hours as needed for wheezing or shortness of breath.   latanoprost 0.005 % ophthalmic solution Commonly known as:  XALATAN Place 1 drop into both eyes at bedtime.   Melatonin 1 MG Tabs Take 1 mg by mouth at bedtime.   metoprolol succinate 50 MG 24 hr tablet Commonly known as:  TOPROL-XL Take 50 mg by mouth daily. Take with or immediately following a meal.   mirtazapine 30 MG tablet Commonly known as:  REMERON Take 30 mg by mouth at bedtime.   OXYGEN Inhale 2 L into  the lungs as needed. for dyspnea   pantoprazole 40 MG tablet Commonly known as:  PROTONIX Take 40 mg by mouth daily.   polyethylene glycol packet Commonly known as:  MIRALAX / GLYCOLAX Take 17 g by mouth every other day.   potassium chloride 10 MEQ CR capsule Commonly known as:  MICRO-K Take 10 mEq by mouth daily.   spironolactone 12.5 mg Tabs tablet Commonly known as:  ALDACTONE Take 6.25 mg by mouth daily.   traMADol 50 MG tablet Commonly known as:  ULTRAM Take 50 mg by mouth at bedtime.   Vitamin D3 5000 units Caps Take 5,000 Units by mouth daily.       No orders of the defined types were placed in this encounter.   Immunization History  Administered Date(s) Administered  . PPD Test 10/31/2015, 11/17/2015    Social  History  Substance Use Topics  . Smoking status: Never Smoker  . Smokeless tobacco: Never Used  . Alcohol use No    Review of systems DATA OBTAINED: from patient, nurse GENERAL:  no fevers, fatigue, appetite changes SKIN: No itching, rash HEENT: No complaint RESPIRATORY: No cough, wheezing, SOB CARDIAC: No chest pain, palpitations, lower extremity edema  GI: No abdominal pain, No N/V/D or constipation, No heartburn or reflux  GU: No dysuria, frequency or urgency, or incontinence  MUSCULOSKELETAL: No unrelieved bone/joint pain; heel pain as described NEUROLOGIC: No headache, dizziness  PSYCHIATRIC: appropriate  Vitals:   06/25/16 1413  BP: 105/65  Pulse: 72  Resp: 16  Temp: 98.5 F (36.9 C)    Physical Exam  GENERAL APPEARANCE: Alert, conversant, No acute distress SKIN: R heel without any lesion or change in color, no point tenderness, some dryness to skin but no open slin HEENT: Unremarkable RESPIRATORY: Breathing is even, unlabored. Lung sounds are clear   CARDIOVASCULAR: Heart RRR no murmurs, rubs or gallops. No peripheral edema  GASTROINTESTINAL: Abdomen is soft, non-tender, mild  distended w/ normal bowel sounds.  GENITOURINARY: Bladder non tender, not distended  MUSCULOSKELETAL: No abnormal joints or musculature NEUROLOGIC: Cranial nerves 2-12 grossly intact. Moves all extremities PSYCHIATRIC:  appropriate; no behavioral issues  Patient Active Problem List   Diagnosis Date Noted  . GERD (gastroesophageal reflux disease) 06/10/2016  . Cerumen debris on tympanic membrane 03/14/2016  . PVD (peripheral vascular disease) ([place]) 01/24/2016  . Dementia without behavioral disturbance 01/01/2016  . Encounter for family conference with patient present 01/01/2016  . Aspiration of food 12/17/2015  . Speech abnormality 11/29/2015  . Senile purpura ([place]) 11/29/2015  . Hip pain, acute 11/06/2015  . Acute encephalopathy 11/06/2015  . Bilateral lower extremity edema  11/06/2015  . UTI (urinary tract infection) 11/06/2015  . Chronic respiratory failure with hypoxia ([place]) 11/06/2015  . Closed left hip fracture ([place]) 09/09/2015  . CHF (congestive heart failure) ([place]) 09/09/2015  . COPD (chronic obstructive pulmonary disease) ([place])   . Atrial fibrillation ([place])   . Hypertensive heart disease with CHF (congestive heart failure) ([place])   . Chronic bronchitis ([place])   . Essential hypertension   . Fall   . Closed pertrochanteric fracture ([facility])     CBC    Component Value Date/Time   WBC 4.0 12/22/2015   WBC 9.0 09/13/2015 0448   RBC 2.89 (L) 09/13/2015 0448   HGB 12.9 12/22/2015   HCT 40 12/22/2015   PLT 127 (A) 12/22/2015   MCV 99.3 09/13/2015 0448   LYMPHSABS 1.1 09/08/2015 2325   MONOABS 0.8 09/08/2015 2325   EOSABS 0.1  09/08/2015 2325   BASOSABS 0.0 09/08/2015 2325    CMP     Component Value Date/Time   NA 141 01/16/2016   K 3.9 01/16/2016   CL 107 09/13/2015 0448   CO2 24 09/13/2015 0448   GLUCOSE 101 (H) 09/13/2015 0448   BUN 11 01/16/2016   CREATININE 0.6 01/16/2016   CREATININE 0.66 09/13/2015 0448   CALCIUM 8.7 (L) 09/13/2015 0448   PROT 6.9 09/08/2015 2325   ALBUMIN 4.1 09/08/2015 2325   AST 26 09/08/2015 2325   ALT 11 (L) 09/08/2015 2325   ALKPHOS 56 09/08/2015 2325   BILITOT 1.3 (H) 09/08/2015 2325   GFRNONAA >60 09/13/2015 0448   GFRAA >60 09/13/2015 0448    Assessment and Plan  HEEL PAIN -  Will have wound care start watching; will inc neurontin to 300 mg qHS and see if that has any impact.   Time spent > 25 min;> 50% of time with patient was spent reviewing records, labs, tests and studies, counseling and developing plan of care  Noah Delaine. Sheppard Coil,  MD

## 2016-06-27 DIAGNOSIS — I10 Essential (primary) hypertension: Secondary | ICD-10-CM | POA: Diagnosis not present

## 2016-06-27 DIAGNOSIS — I4891 Unspecified atrial fibrillation: Secondary | ICD-10-CM | POA: Diagnosis not present

## 2016-06-27 DIAGNOSIS — R609 Edema, unspecified: Secondary | ICD-10-CM | POA: Diagnosis not present

## 2016-06-27 DIAGNOSIS — I509 Heart failure, unspecified: Secondary | ICD-10-CM | POA: Diagnosis not present

## 2016-06-27 DIAGNOSIS — I739 Peripheral vascular disease, unspecified: Secondary | ICD-10-CM | POA: Diagnosis not present

## 2016-06-27 DIAGNOSIS — I519 Heart disease, unspecified: Secondary | ICD-10-CM | POA: Diagnosis not present

## 2016-06-28 DIAGNOSIS — I519 Heart disease, unspecified: Secondary | ICD-10-CM | POA: Diagnosis not present

## 2016-06-28 DIAGNOSIS — R609 Edema, unspecified: Secondary | ICD-10-CM | POA: Diagnosis not present

## 2016-06-28 DIAGNOSIS — I4891 Unspecified atrial fibrillation: Secondary | ICD-10-CM | POA: Diagnosis not present

## 2016-06-28 DIAGNOSIS — I10 Essential (primary) hypertension: Secondary | ICD-10-CM | POA: Diagnosis not present

## 2016-06-28 DIAGNOSIS — I739 Peripheral vascular disease, unspecified: Secondary | ICD-10-CM | POA: Diagnosis not present

## 2016-06-28 DIAGNOSIS — I509 Heart failure, unspecified: Secondary | ICD-10-CM | POA: Diagnosis not present

## 2016-06-29 ENCOUNTER — Encounter: Payer: Self-pay | Admitting: Internal Medicine

## 2016-06-29 DIAGNOSIS — R609 Edema, unspecified: Secondary | ICD-10-CM | POA: Diagnosis not present

## 2016-06-29 DIAGNOSIS — I509 Heart failure, unspecified: Secondary | ICD-10-CM | POA: Diagnosis not present

## 2016-06-29 DIAGNOSIS — I10 Essential (primary) hypertension: Secondary | ICD-10-CM | POA: Diagnosis not present

## 2016-06-29 DIAGNOSIS — I519 Heart disease, unspecified: Secondary | ICD-10-CM | POA: Diagnosis not present

## 2016-06-29 DIAGNOSIS — I4891 Unspecified atrial fibrillation: Secondary | ICD-10-CM | POA: Diagnosis not present

## 2016-06-29 DIAGNOSIS — I739 Peripheral vascular disease, unspecified: Secondary | ICD-10-CM | POA: Diagnosis not present

## 2016-07-02 DIAGNOSIS — I509 Heart failure, unspecified: Secondary | ICD-10-CM | POA: Diagnosis not present

## 2016-07-02 DIAGNOSIS — I4891 Unspecified atrial fibrillation: Secondary | ICD-10-CM | POA: Diagnosis not present

## 2016-07-02 DIAGNOSIS — I10 Essential (primary) hypertension: Secondary | ICD-10-CM | POA: Diagnosis not present

## 2016-07-02 DIAGNOSIS — R609 Edema, unspecified: Secondary | ICD-10-CM | POA: Diagnosis not present

## 2016-07-02 DIAGNOSIS — I519 Heart disease, unspecified: Secondary | ICD-10-CM | POA: Diagnosis not present

## 2016-07-02 DIAGNOSIS — I739 Peripheral vascular disease, unspecified: Secondary | ICD-10-CM | POA: Diagnosis not present

## 2016-07-03 DIAGNOSIS — Z961 Presence of intraocular lens: Secondary | ICD-10-CM | POA: Diagnosis not present

## 2016-07-03 DIAGNOSIS — H401134 Primary open-angle glaucoma, bilateral, indeterminate stage: Secondary | ICD-10-CM | POA: Diagnosis not present

## 2016-07-03 DIAGNOSIS — H26492 Other secondary cataract, left eye: Secondary | ICD-10-CM | POA: Diagnosis not present

## 2016-07-03 DIAGNOSIS — H353233 Exudative age-related macular degeneration, bilateral, with inactive scar: Secondary | ICD-10-CM | POA: Diagnosis not present

## 2016-07-04 DIAGNOSIS — I519 Heart disease, unspecified: Secondary | ICD-10-CM | POA: Diagnosis not present

## 2016-07-04 DIAGNOSIS — I4891 Unspecified atrial fibrillation: Secondary | ICD-10-CM | POA: Diagnosis not present

## 2016-07-04 DIAGNOSIS — I10 Essential (primary) hypertension: Secondary | ICD-10-CM | POA: Diagnosis not present

## 2016-07-04 DIAGNOSIS — I509 Heart failure, unspecified: Secondary | ICD-10-CM | POA: Diagnosis not present

## 2016-07-04 DIAGNOSIS — R609 Edema, unspecified: Secondary | ICD-10-CM | POA: Diagnosis not present

## 2016-07-04 DIAGNOSIS — I739 Peripheral vascular disease, unspecified: Secondary | ICD-10-CM | POA: Diagnosis not present

## 2016-07-05 ENCOUNTER — Encounter: Payer: Self-pay | Admitting: Internal Medicine

## 2016-07-05 DIAGNOSIS — I739 Peripheral vascular disease, unspecified: Secondary | ICD-10-CM | POA: Diagnosis not present

## 2016-07-05 DIAGNOSIS — F329 Major depressive disorder, single episode, unspecified: Secondary | ICD-10-CM | POA: Insufficient documentation

## 2016-07-05 DIAGNOSIS — I519 Heart disease, unspecified: Secondary | ICD-10-CM | POA: Diagnosis not present

## 2016-07-05 DIAGNOSIS — I4891 Unspecified atrial fibrillation: Secondary | ICD-10-CM | POA: Diagnosis not present

## 2016-07-05 DIAGNOSIS — F32A Depression, unspecified: Secondary | ICD-10-CM | POA: Insufficient documentation

## 2016-07-05 DIAGNOSIS — I10 Essential (primary) hypertension: Secondary | ICD-10-CM | POA: Diagnosis not present

## 2016-07-05 DIAGNOSIS — R609 Edema, unspecified: Secondary | ICD-10-CM | POA: Diagnosis not present

## 2016-07-05 DIAGNOSIS — I509 Heart failure, unspecified: Secondary | ICD-10-CM | POA: Diagnosis not present

## 2016-07-05 NOTE — Assessment & Plan Note (Signed)
Pt continues to do well, dresses , is out of bed, can carry on a prolonged conversation; cont supportive care.

## 2016-07-05 NOTE — Assessment & Plan Note (Signed)
Pt is doing well on remeron 15 mg QHS; will cont

## 2016-07-05 NOTE — Assessment & Plan Note (Signed)
Pt has had no skin breakdowns or wounds; cont ASA 325 mg daily

## 2016-07-06 DIAGNOSIS — I509 Heart failure, unspecified: Secondary | ICD-10-CM | POA: Diagnosis not present

## 2016-07-06 DIAGNOSIS — I4891 Unspecified atrial fibrillation: Secondary | ICD-10-CM | POA: Diagnosis not present

## 2016-07-06 DIAGNOSIS — I519 Heart disease, unspecified: Secondary | ICD-10-CM | POA: Diagnosis not present

## 2016-07-06 DIAGNOSIS — R609 Edema, unspecified: Secondary | ICD-10-CM | POA: Diagnosis not present

## 2016-07-06 DIAGNOSIS — I739 Peripheral vascular disease, unspecified: Secondary | ICD-10-CM | POA: Diagnosis not present

## 2016-07-06 DIAGNOSIS — I10 Essential (primary) hypertension: Secondary | ICD-10-CM | POA: Diagnosis not present

## 2016-07-09 DIAGNOSIS — I739 Peripheral vascular disease, unspecified: Secondary | ICD-10-CM | POA: Diagnosis not present

## 2016-07-09 DIAGNOSIS — I509 Heart failure, unspecified: Secondary | ICD-10-CM | POA: Diagnosis not present

## 2016-07-09 DIAGNOSIS — I10 Essential (primary) hypertension: Secondary | ICD-10-CM | POA: Diagnosis not present

## 2016-07-09 DIAGNOSIS — R609 Edema, unspecified: Secondary | ICD-10-CM | POA: Diagnosis not present

## 2016-07-09 DIAGNOSIS — I4891 Unspecified atrial fibrillation: Secondary | ICD-10-CM | POA: Diagnosis not present

## 2016-07-09 DIAGNOSIS — I519 Heart disease, unspecified: Secondary | ICD-10-CM | POA: Diagnosis not present

## 2016-07-10 DIAGNOSIS — I10 Essential (primary) hypertension: Secondary | ICD-10-CM | POA: Diagnosis not present

## 2016-07-10 DIAGNOSIS — I4891 Unspecified atrial fibrillation: Secondary | ICD-10-CM | POA: Diagnosis not present

## 2016-07-10 DIAGNOSIS — I509 Heart failure, unspecified: Secondary | ICD-10-CM | POA: Diagnosis not present

## 2016-07-10 DIAGNOSIS — R609 Edema, unspecified: Secondary | ICD-10-CM | POA: Diagnosis not present

## 2016-07-10 DIAGNOSIS — I519 Heart disease, unspecified: Secondary | ICD-10-CM | POA: Diagnosis not present

## 2016-07-10 DIAGNOSIS — I739 Peripheral vascular disease, unspecified: Secondary | ICD-10-CM | POA: Diagnosis not present

## 2016-07-11 DIAGNOSIS — I10 Essential (primary) hypertension: Secondary | ICD-10-CM | POA: Diagnosis not present

## 2016-07-11 DIAGNOSIS — I509 Heart failure, unspecified: Secondary | ICD-10-CM | POA: Diagnosis not present

## 2016-07-11 DIAGNOSIS — I739 Peripheral vascular disease, unspecified: Secondary | ICD-10-CM | POA: Diagnosis not present

## 2016-07-11 DIAGNOSIS — I519 Heart disease, unspecified: Secondary | ICD-10-CM | POA: Diagnosis not present

## 2016-07-11 DIAGNOSIS — R609 Edema, unspecified: Secondary | ICD-10-CM | POA: Diagnosis not present

## 2016-07-11 DIAGNOSIS — I4891 Unspecified atrial fibrillation: Secondary | ICD-10-CM | POA: Diagnosis not present

## 2016-07-12 DIAGNOSIS — I4891 Unspecified atrial fibrillation: Secondary | ICD-10-CM | POA: Diagnosis not present

## 2016-07-12 DIAGNOSIS — I509 Heart failure, unspecified: Secondary | ICD-10-CM | POA: Diagnosis not present

## 2016-07-12 DIAGNOSIS — I519 Heart disease, unspecified: Secondary | ICD-10-CM | POA: Diagnosis not present

## 2016-07-12 DIAGNOSIS — I739 Peripheral vascular disease, unspecified: Secondary | ICD-10-CM | POA: Diagnosis not present

## 2016-07-12 DIAGNOSIS — I10 Essential (primary) hypertension: Secondary | ICD-10-CM | POA: Diagnosis not present

## 2016-07-12 DIAGNOSIS — R609 Edema, unspecified: Secondary | ICD-10-CM | POA: Diagnosis not present

## 2016-07-13 DIAGNOSIS — I509 Heart failure, unspecified: Secondary | ICD-10-CM | POA: Diagnosis not present

## 2016-07-13 DIAGNOSIS — I519 Heart disease, unspecified: Secondary | ICD-10-CM | POA: Diagnosis not present

## 2016-07-13 DIAGNOSIS — I4891 Unspecified atrial fibrillation: Secondary | ICD-10-CM | POA: Diagnosis not present

## 2016-07-13 DIAGNOSIS — R609 Edema, unspecified: Secondary | ICD-10-CM | POA: Diagnosis not present

## 2016-07-13 DIAGNOSIS — I10 Essential (primary) hypertension: Secondary | ICD-10-CM | POA: Diagnosis not present

## 2016-07-13 DIAGNOSIS — I739 Peripheral vascular disease, unspecified: Secondary | ICD-10-CM | POA: Diagnosis not present

## 2016-07-16 DIAGNOSIS — I739 Peripheral vascular disease, unspecified: Secondary | ICD-10-CM | POA: Diagnosis not present

## 2016-07-16 DIAGNOSIS — I4891 Unspecified atrial fibrillation: Secondary | ICD-10-CM | POA: Diagnosis not present

## 2016-07-16 DIAGNOSIS — I509 Heart failure, unspecified: Secondary | ICD-10-CM | POA: Diagnosis not present

## 2016-07-16 DIAGNOSIS — I10 Essential (primary) hypertension: Secondary | ICD-10-CM | POA: Diagnosis not present

## 2016-07-16 DIAGNOSIS — R609 Edema, unspecified: Secondary | ICD-10-CM | POA: Diagnosis not present

## 2016-07-16 DIAGNOSIS — I519 Heart disease, unspecified: Secondary | ICD-10-CM | POA: Diagnosis not present

## 2016-07-17 DIAGNOSIS — R609 Edema, unspecified: Secondary | ICD-10-CM | POA: Diagnosis not present

## 2016-07-17 DIAGNOSIS — I739 Peripheral vascular disease, unspecified: Secondary | ICD-10-CM | POA: Diagnosis not present

## 2016-07-17 DIAGNOSIS — I4891 Unspecified atrial fibrillation: Secondary | ICD-10-CM | POA: Diagnosis not present

## 2016-07-17 DIAGNOSIS — I519 Heart disease, unspecified: Secondary | ICD-10-CM | POA: Diagnosis not present

## 2016-07-17 DIAGNOSIS — I10 Essential (primary) hypertension: Secondary | ICD-10-CM | POA: Diagnosis not present

## 2016-07-17 DIAGNOSIS — I509 Heart failure, unspecified: Secondary | ICD-10-CM | POA: Diagnosis not present

## 2016-07-18 DIAGNOSIS — I519 Heart disease, unspecified: Secondary | ICD-10-CM | POA: Diagnosis not present

## 2016-07-18 DIAGNOSIS — I4891 Unspecified atrial fibrillation: Secondary | ICD-10-CM | POA: Diagnosis not present

## 2016-07-18 DIAGNOSIS — I739 Peripheral vascular disease, unspecified: Secondary | ICD-10-CM | POA: Diagnosis not present

## 2016-07-18 DIAGNOSIS — I10 Essential (primary) hypertension: Secondary | ICD-10-CM | POA: Diagnosis not present

## 2016-07-18 DIAGNOSIS — R609 Edema, unspecified: Secondary | ICD-10-CM | POA: Diagnosis not present

## 2016-07-18 DIAGNOSIS — I509 Heart failure, unspecified: Secondary | ICD-10-CM | POA: Diagnosis not present

## 2016-07-19 ENCOUNTER — Non-Acute Institutional Stay (SKILLED_NURSING_FACILITY): Payer: Medicare Other | Admitting: Internal Medicine

## 2016-07-19 ENCOUNTER — Encounter: Payer: Self-pay | Admitting: Internal Medicine

## 2016-07-19 DIAGNOSIS — I4891 Unspecified atrial fibrillation: Secondary | ICD-10-CM | POA: Diagnosis not present

## 2016-07-19 DIAGNOSIS — I739 Peripheral vascular disease, unspecified: Secondary | ICD-10-CM | POA: Diagnosis not present

## 2016-07-19 DIAGNOSIS — R600 Localized edema: Secondary | ICD-10-CM

## 2016-07-19 DIAGNOSIS — R609 Edema, unspecified: Secondary | ICD-10-CM | POA: Diagnosis not present

## 2016-07-19 DIAGNOSIS — R2242 Localized swelling, mass and lump, left lower limb: Secondary | ICD-10-CM | POA: Diagnosis not present

## 2016-07-19 DIAGNOSIS — I10 Essential (primary) hypertension: Secondary | ICD-10-CM | POA: Diagnosis not present

## 2016-07-19 DIAGNOSIS — I519 Heart disease, unspecified: Secondary | ICD-10-CM | POA: Diagnosis not present

## 2016-07-19 DIAGNOSIS — I509 Heart failure, unspecified: Secondary | ICD-10-CM | POA: Diagnosis not present

## 2016-07-19 NOTE — Progress Notes (Signed)
MRN: [id number] Name: Mariah [name]  Sex: female Age: 82 y.o. DOB: July 30, 1919  [place] #:  Facility/Room: [facility]: SNF Provider: Noah Delaine. Sheppard Coil, MD Emergency Contacts: Extended Emergency Contact Information Primary Emergency Contact: [name],[name] Address: [address]          [place], [place] [address] [name] of [place] Phone: [phone] Relation: Daughter  Code Status: DNR  Allergies: Lyrica [pregabalin] and Penicillins  Chief Complaint  Patient presents with  . Acute Visit    Acute    HPI: Patient is 82 y.o. female who nursing asked me to see for swelling in her R leg, more than prior. Pt admits that she always has swelling in one leg more than the other at all times. Pt denies SOB. Pt admits some pain in R leg.  Past Medical History:  Diagnosis Date  . Atrial fibrillation ([place])   . CHF (congestive heart failure) ([place])   . COPD (chronic obstructive pulmonary disease) ([place])   . Glaucoma   . Hypertension     Past Surgical History:  Procedure Laterality Date  . FEMUR IM NAIL Left 09/09/2015   Procedure: INTRAMEDULLARY (IM) NAIL FEMORAL;  Surgeon: Rod Can, MD;  Location: [facility];  Service: Orthopedics;  Laterality: Left;  . HERNIA REPAIR        Medication List       Accurate as of 07/19/16 11:59 PM. Always use your most recent med list.          acetaminophen 325 MG tablet Commonly known as:  TYLENOL Take 650 mg by mouth every 4 (four) hours as needed for moderate pain.   aspirin 325 MG EC tablet Take 325 mg by mouth daily.   bisacodyl 5 MG EC tablet Commonly known as:  DULCOLAX Take 10 mg by mouth daily as needed for moderate constipation. Reported on 02/14/2016   brimonidine 0.1 % Soln Commonly known as:  ALPHAGAN P Place 1 drop into both eyes 2 (two) times daily.   ENSURE PLUS Liqd Take 1 Can by mouth 2 (two) times daily between meals. Offer at bedtime and in between meals   furosemide 40 MG  tablet Commonly known as:  LASIX Take 40 mg by mouth daily.   gabapentin 300 MG capsule Commonly known as:  NEURONTIN Take 300 mg by mouth at bedtime.   guaiFENesin 600 MG 12 hr tablet Commonly known as:  MUCINEX Take 600 mg by mouth 2 (two) times daily.   guaiFENesin 100 MG/5ML liquid Commonly known as:  ROBITUSSIN Give 10 cc by mouth every 4 hours as needed for cough   HYDROcodone-acetaminophen 5-325 MG tablet Commonly known as:  NORCO/VICODIN Take 1 tablet by mouth every 4 (four) hours as needed for moderate pain or severe pain. No more than 3 grams of Tylenol in 24 hours   ICAPS AREDS 2 Caps Take 1 capsule by mouth 2 (two) times daily.   ipratropium 0.02 % nebulizer solution Commonly known as:  ATROVENT Take 0.5 mg by nebulization every 6 (six) hours as needed for wheezing or shortness of breath.   latanoprost 0.005 % ophthalmic solution Commonly known as:  XALATAN Place 1 drop into both eyes at bedtime.   Melatonin 1 MG Tabs Take 1 mg by mouth at bedtime.   metoprolol succinate 50 MG 24 hr tablet Commonly known as:  TOPROL-XL Take 50 mg by mouth daily. Take with or immediately following a meal.   mirtazapine 30 MG tablet Commonly known as:  REMERON Take 30 mg by mouth at bedtime.   pantoprazole 40 MG tablet Commonly known as:  PROTONIX Take 40 mg by mouth daily.   polyethylene glycol packet Commonly known as:  MIRALAX / GLYCOLAX Take 17 g by mouth every other day.   potassium chloride 10 MEQ CR capsule Commonly known as:  MICRO-K Take 10 mEq by mouth daily.   spironolactone 12.5 mg Tabs tablet Commonly known as:  ALDACTONE Take 6.25 mg by mouth daily.   traMADol 50 MG tablet Commonly known as:  ULTRAM Take 50 mg by mouth at bedtime.   Vitamin D3 5000 units Caps Take 5,000 Units by mouth daily.       Meds ordered this encounter  Medications  . gabapentin (NEURONTIN) 300 MG capsule    Sig: Take 300 mg by mouth at bedtime.  . Multiple  Vitamins-Minerals (ICAPS AREDS 2) CAPS    Sig: Take 1 capsule by mouth 2 (two) times daily.    Immunization History  Administered Date(s) Administered  . PPD Test 10/31/2015, 11/17/2015    Social History  Substance Use Topics  . Smoking status: Never Smoker  . Smokeless tobacco: Never Used  . Alcohol use No    Review of Systems  DATA OBTAINED: from patient, nurse GENERAL:  no fevers, fatigue, appetite changes SKIN: No itching, rash HEENT: No complaint RESPIRATORY: No cough, wheezing, SOB CARDIAC: No chest pain, palpitations,+ lower extremity edema  GI: No abdominal pain, No N/V/D or constipation, No heartburn or reflux  GU: No dysuria, frequency or urgency, or incontinence  MUSCULOSKELETAL: No unrelieved bone/joint pain NEUROLOGIC: No headache, dizziness  PSYCHIATRIC: No overt anxiety or sadness  Vitals:   07/19/16 1453  BP: 105/65  Pulse: 86  Resp: 20  Temp: 98.5 F (36.9 C)    Physical Exam  GENERAL APPEARANCE: Alert, conversant, No acute distress  SKIN: No diaphoresis rash HEENT: Unremarkable RESPIRATORY: Breathing is even, unlabored. Lung sounds are clear   CARDIOVASCULAR: Heart RRR no murmurs, rubs or gallops. 1+ peripheral edema RLE into thigh with mild TTP post thigh GASTROINTESTINAL: Abdomen is soft, non-tender, not distended w/ normal bowel sounds.  GENITOURINARY: Bladder non tender, not distended  MUSCULOSKELETAL: No abnormal joints or musculature NEUROLOGIC: Cranial nerves 2-12 grossly intact. Moves all extremities PSYCHIATRIC: Mood and affect appropriate to situation with dementia, no behavioral issues  Patient Active Problem List   Diagnosis Date Noted  . Depression 07/05/2016  . GERD (gastroesophageal reflux disease) 06/10/2016  . Cerumen debris on tympanic membrane 03/14/2016  . PVD (peripheral vascular disease) ([place]) 01/24/2016  . Dementia without behavioral disturbance 01/01/2016  . Encounter for family conference with patient present  01/01/2016  . Aspiration of food 12/17/2015  . Speech abnormality 11/29/2015  . Senile purpura ([place]) 11/29/2015  . Hip pain, acute 11/06/2015  . Acute encephalopathy 11/06/2015  . Bilateral lower extremity edema 11/06/2015  . UTI (urinary tract infection) 11/06/2015  . Chronic respiratory failure with hypoxia ([place]) 11/06/2015  . Closed left hip fracture ([place]) 09/09/2015  . CHF (congestive heart failure) ([place]) 09/09/2015  . COPD (chronic obstructive pulmonary disease) ([place])   . Atrial fibrillation ([place])   . Hypertensive heart disease with CHF (congestive heart failure) ([place])   . Chronic bronchitis ([place])   . Essential hypertension   . Fall   . Closed pertrochanteric fracture ([facility])     CBC    Component Value Date/Time   WBC 4.0 12/22/2015   WBC 9.0 09/13/2015 0448   RBC 2.89 (L) 09/13/2015 0448  HGB 12.9 12/22/2015   HCT 40 12/22/2015   PLT 127 (A) 12/22/2015   MCV 99.3 09/13/2015 0448   LYMPHSABS 1.1 09/08/2015 2325   MONOABS 0.8 09/08/2015 2325   EOSABS 0.1 09/08/2015 2325   BASOSABS 0.0 09/08/2015 2325    CMP     Component Value Date/Time   NA 141 01/16/2016   K 3.9 01/16/2016   CL 107 09/13/2015 0448   CO2 24 09/13/2015 0448   GLUCOSE 101 (H) 09/13/2015 0448   BUN 11 01/16/2016   CREATININE 0.6 01/16/2016   CREATININE 0.66 09/13/2015 0448   CALCIUM 8.7 (L) 09/13/2015 0448   PROT 6.9 09/08/2015 2325   ALBUMIN 4.1 09/08/2015 2325   AST 26 09/08/2015 2325   ALT 11 (L) 09/08/2015 2325   ALKPHOS 56 09/08/2015 2325   BILITOT 1.3 (H) 09/08/2015 2325   GFRNONAA >60 09/13/2015 0448   GFRAA >60 09/13/2015 0448    Assessment and Plan  RLE EDEMA - has findings  c/w possible DVT; have ordered US   Time spent > 25 min;> 50% of time with patient was spent reviewing records, labs, tests and studies, counseling and developing plan of care  Webb Silversmith D. Sheppard Coil, MD

## 2016-07-20 ENCOUNTER — Non-Acute Institutional Stay (SKILLED_NURSING_FACILITY): Payer: Medicare Other | Admitting: Internal Medicine

## 2016-07-20 ENCOUNTER — Encounter: Payer: Self-pay | Admitting: Internal Medicine

## 2016-07-20 DIAGNOSIS — F339 Major depressive disorder, recurrent, unspecified: Secondary | ICD-10-CM | POA: Diagnosis not present

## 2016-07-20 DIAGNOSIS — H11149 Conjunctival xerosis, unspecified, unspecified eye: Secondary | ICD-10-CM | POA: Diagnosis not present

## 2016-07-20 DIAGNOSIS — R0902 Hypoxemia: Secondary | ICD-10-CM | POA: Diagnosis not present

## 2016-07-20 DIAGNOSIS — I110 Hypertensive heart disease with heart failure: Secondary | ICD-10-CM | POA: Diagnosis not present

## 2016-07-20 DIAGNOSIS — E559 Vitamin D deficiency, unspecified: Secondary | ICD-10-CM | POA: Diagnosis not present

## 2016-07-20 DIAGNOSIS — I4891 Unspecified atrial fibrillation: Secondary | ICD-10-CM

## 2016-07-20 DIAGNOSIS — I509 Heart failure, unspecified: Secondary | ICD-10-CM | POA: Diagnosis not present

## 2016-07-20 DIAGNOSIS — I5032 Chronic diastolic (congestive) heart failure: Secondary | ICD-10-CM | POA: Diagnosis not present

## 2016-07-20 DIAGNOSIS — N390 Urinary tract infection, site not specified: Secondary | ICD-10-CM | POA: Diagnosis not present

## 2016-07-20 DIAGNOSIS — I519 Heart disease, unspecified: Secondary | ICD-10-CM | POA: Diagnosis not present

## 2016-07-20 DIAGNOSIS — J449 Chronic obstructive pulmonary disease, unspecified: Secondary | ICD-10-CM | POA: Diagnosis not present

## 2016-07-20 DIAGNOSIS — I739 Peripheral vascular disease, unspecified: Secondary | ICD-10-CM | POA: Diagnosis not present

## 2016-07-20 DIAGNOSIS — H409 Unspecified glaucoma: Secondary | ICD-10-CM | POA: Diagnosis not present

## 2016-07-20 DIAGNOSIS — K219 Gastro-esophageal reflux disease without esophagitis: Secondary | ICD-10-CM | POA: Diagnosis not present

## 2016-07-20 DIAGNOSIS — R609 Edema, unspecified: Secondary | ICD-10-CM | POA: Diagnosis not present

## 2016-07-20 DIAGNOSIS — I10 Essential (primary) hypertension: Secondary | ICD-10-CM | POA: Diagnosis not present

## 2016-07-20 NOTE — Progress Notes (Signed)
MRN: [id number] Name: Mariah [name]  Sex: female Age: 82 y.o. DOB: 12-Jan-1919  [place] #:  Facility/Room: [facility]: SNF Provider: Noah Delaine. Sheppard Coil, MD Emergency Contacts: Extended Emergency Contact Information Primary Emergency Contact: [name],[name] Address: [address]          [place], [place] [address] [name] of [place] Phone: [phone] Relation: Daughter  Code Status: DNR  Allergies: Lyrica [pregabalin] and Penicillins  Chief Complaint  Patient presents with  . Medical Management of Chronic Issues    Routine Visit    HPI: Patient is 82 y.o. female who was seen yesterday for RLE edema, whose doppler study was negative for a DVT who is being seen today forroutine issues of AF, HTN and chronic dCHF.  Past Medical History:  Diagnosis Date  . Atrial fibrillation ([place])   . CHF (congestive heart failure) ([place])   . COPD (chronic obstructive pulmonary disease) ([place])   . Glaucoma   . Hypertension     Past Surgical History:  Procedure Laterality Date  . FEMUR IM NAIL Left 09/09/2015   Procedure: INTRAMEDULLARY (IM) NAIL FEMORAL;  Surgeon: Rod Can, MD;  Location: [facility];  Service: Orthopedics;  Laterality: Left;  . HERNIA REPAIR        Medication List       Accurate as of 07/20/16 11:59 PM. Always use your most recent med list.          acetaminophen 325 MG tablet Commonly known as:  TYLENOL Take 650 mg by mouth every 4 (four) hours as needed for moderate pain.   aspirin 325 MG EC tablet Take 325 mg by mouth daily.   bisacodyl 5 MG EC tablet Commonly known as:  DULCOLAX Take 10 mg by mouth daily as needed for moderate constipation. Reported on 02/14/2016   brimonidine 0.1 % Soln Commonly known as:  ALPHAGAN P Place 1 drop into both eyes 2 (two) times daily.   ENSURE PLUS Liqd Take 1 Can by mouth 2 (two) times daily between meals. Offer at bedtime and in between meals   furosemide 40 MG tablet Commonly known as:   LASIX Take 40 mg by mouth daily.   gabapentin 300 MG capsule Commonly known as:  NEURONTIN Take 300 mg by mouth at bedtime.   guaiFENesin 600 MG 12 hr tablet Commonly known as:  MUCINEX Take 600 mg by mouth 2 (two) times daily.   guaiFENesin 100 MG/5ML liquid Commonly known as:  ROBITUSSIN Give 10 cc by mouth every 4 hours as needed for cough   HYDROcodone-acetaminophen 5-325 MG tablet Commonly known as:  NORCO/VICODIN Take 1 tablet by mouth every 4 (four) hours as needed for moderate pain or severe pain. No more than 3 grams of Tylenol in 24 hours   ICAPS AREDS 2 Caps Take 1 capsule by mouth 2 (two) times daily.   ipratropium 0.02 % nebulizer solution Commonly known as:  ATROVENT Take 0.5 mg by nebulization every 6 (six) hours as needed for wheezing or shortness of breath.   latanoprost 0.005 % ophthalmic solution Commonly known as:  XALATAN Place 1 drop into both eyes at bedtime.   Melatonin 1 MG Tabs Take 1 mg by mouth at bedtime.   metoprolol succinate 50 MG 24 hr tablet Commonly known as:  TOPROL-XL Take 50 mg by mouth daily. Take with or immediately following a meal.   mirtazapine 30 MG tablet Commonly known as:  REMERON Take 30 mg by mouth at bedtime.  pantoprazole 40 MG tablet Commonly known as:  PROTONIX Take 40 mg by mouth daily.   polyethylene glycol packet Commonly known as:  MIRALAX / GLYCOLAX Take 17 g by mouth every other day.   potassium chloride 10 MEQ CR capsule Commonly known as:  MICRO-K Take 10 mEq by mouth daily.   spironolactone 12.5 mg Tabs tablet Commonly known as:  ALDACTONE Take 6.25 mg by mouth daily.   traMADol 50 MG tablet Commonly known as:  ULTRAM Take 50 mg by mouth at bedtime.   Vitamin D3 5000 units Caps Take 5,000 Units by mouth daily.       No orders of the defined types were placed in this encounter.   Immunization History  Administered Date(s) Administered  . PPD Test 10/31/2015, 11/17/2015    Social  History  Substance Use Topics  . Smoking status: Never Smoker  . Smokeless tobacco: Never Used  . Alcohol use No    Review of Systems  DATA OBTAINED: from patient, nurse GENERAL:  no fevers, fatigue, appetite changes SKIN: No itching, rash HEENT: No complaint RESPIRATORY: No cough, wheezing, SOB CARDIAC: No chest pain, palpitations, lower extremity edema  GI: No abdominal pain, No N/V/D or constipation, No heartburn or reflux  GU: No dysuria, frequency or urgency, or incontinence  MUSCULOSKELETAL: No unrelieved bone/joint pain NEUROLOGIC: No headache, dizziness  PSYCHIATRIC: No overt anxiety or sadness  Vitals:   07/20/16 1020  BP: 105/65  Pulse: 86  Resp: 20  Temp: 98.5 F (36.9 C)    Physical Exam  GENERAL APPEARANCE: Alert, conversant, No acute distress  SKIN: No diaphoresis rash HEENT: Unremarkable RESPIRATORY: Breathing is even, unlabored. Lung sounds are clear   CARDIOVASCULAR: Heart RRR no murmurs, rubs or gallops. RLE  peripheral edema  GASTROINTESTINAL: Abdomen is soft, non-tender, not distended w/ normal bowel sounds.  GENITOURINARY: Bladder non tender, not distended  MUSCULOSKELETAL: No abnormal joints or musculature NEUROLOGIC: Cranial nerves 2-12 grossly intact. Moves all extremities PSYCHIATRIC: Mood and affect appropriate to situation with dementia, no behavioral issues  Patient Active Problem List   Diagnosis Date Noted  . Depression 07/05/2016  . GERD (gastroesophageal reflux disease) 06/10/2016  . Cerumen debris on tympanic membrane 03/14/2016  . PVD (peripheral vascular disease) ([place]) 01/24/2016  . Dementia without behavioral disturbance 01/01/2016  . Encounter for family conference with patient present 01/01/2016  . Aspiration of food 12/17/2015  . Speech abnormality 11/29/2015  . Senile purpura ([place]) 11/29/2015  . Hip pain, acute 11/06/2015  . Acute encephalopathy 11/06/2015  . Bilateral lower extremity edema 11/06/2015  . UTI (urinary  tract infection) 11/06/2015  . Chronic respiratory failure with hypoxia ([place]) 11/06/2015  . Closed left hip fracture ([place]) 09/09/2015  . Chronic diastolic CHF (congestive heart failure) ([place]) 09/09/2015  . COPD (chronic obstructive pulmonary disease) ([place])   . Atrial fibrillation ([place])   . Hypertensive heart disease with CHF (congestive heart failure) ([place])   . Chronic bronchitis ([place])   . Essential hypertension   . Fall   . Closed pertrochanteric fracture ([facility])     CBC    Component Value Date/Time   WBC 4.0 12/22/2015   WBC 9.0 09/13/2015 0448   RBC 2.89 (L) 09/13/2015 0448   HGB 12.9 12/22/2015   HCT 40 12/22/2015   PLT 127 (A) 12/22/2015   MCV 99.3 09/13/2015 0448   LYMPHSABS 1.1 09/08/2015 2325   MONOABS 0.8 09/08/2015 2325   EOSABS 0.1 09/08/2015 2325   BASOSABS 0.0 09/08/2015 2325  CMP     Component Value Date/Time   NA 141 01/16/2016   K 3.9 01/16/2016   CL 107 09/13/2015 0448   CO2 24 09/13/2015 0448   GLUCOSE 101 (H) 09/13/2015 0448   BUN 11 01/16/2016   CREATININE 0.6 01/16/2016   CREATININE 0.66 09/13/2015 0448   CALCIUM 8.7 (L) 09/13/2015 0448   PROT 6.9 09/08/2015 2325   ALBUMIN 4.1 09/08/2015 2325   AST 26 09/08/2015 2325   ALT 11 (L) 09/08/2015 2325   ALKPHOS 56 09/08/2015 2325   BILITOT 1.3 (H) 09/08/2015 2325   GFRNONAA >60 09/13/2015 0448   GFRAA >60 09/13/2015 0448    Assessment and Plan  Atrial fibrillation ([facility]CC) Pt continues to be stable with metoprolol XL 50 mg daily for rate control and ASA 325 mg daily as prophylaxis;plan to make no changes  Chronic diastolic CHF (congestive heart failure) ([facility]) Pt has had no recent exacerbation ;plan to continue metoprolol XL 50 mg daily along with lasix 40 mg and spironolactone 6.125 mg daily  Hypertensive heart disease with CHF (congestive heart failure) ([place]) Well controlled on lasix 40 mg daily, aldactone 6.25 mg daily and toprol XL 50 mg daily; do not plan to change regimen   Webb Silversmith D.  Sheppard Coil , MD

## 2016-07-21 ENCOUNTER — Encounter: Payer: Self-pay | Admitting: Internal Medicine

## 2016-07-21 NOTE — Assessment & Plan Note (Signed)
Pt continues to be stable with metoprolol XL 50 mg daily for rate control and ASA 325 mg daily as prophylaxis;plan to make no changes

## 2016-07-21 NOTE — Assessment & Plan Note (Signed)
Well controlled on lasix 40 mg daily, aldactone 6.25 mg daily and toprol XL 50 mg daily; do not plan to change regimen

## 2016-07-21 NOTE — Assessment & Plan Note (Signed)
Pt has had no recent exacerbation ;plan to continue metoprolol XL 50 mg daily along with lasix 40 mg and spironolactone 6.125 mg daily

## 2016-07-24 DIAGNOSIS — I519 Heart disease, unspecified: Secondary | ICD-10-CM | POA: Diagnosis not present

## 2016-07-24 DIAGNOSIS — I509 Heart failure, unspecified: Secondary | ICD-10-CM | POA: Diagnosis not present

## 2016-07-24 DIAGNOSIS — I739 Peripheral vascular disease, unspecified: Secondary | ICD-10-CM | POA: Diagnosis not present

## 2016-07-24 DIAGNOSIS — I10 Essential (primary) hypertension: Secondary | ICD-10-CM | POA: Diagnosis not present

## 2016-07-24 DIAGNOSIS — I4891 Unspecified atrial fibrillation: Secondary | ICD-10-CM | POA: Diagnosis not present

## 2016-07-24 DIAGNOSIS — R609 Edema, unspecified: Secondary | ICD-10-CM | POA: Diagnosis not present

## 2016-07-25 DIAGNOSIS — I519 Heart disease, unspecified: Secondary | ICD-10-CM | POA: Diagnosis not present

## 2016-07-25 DIAGNOSIS — I509 Heart failure, unspecified: Secondary | ICD-10-CM | POA: Diagnosis not present

## 2016-07-25 DIAGNOSIS — I4891 Unspecified atrial fibrillation: Secondary | ICD-10-CM | POA: Diagnosis not present

## 2016-07-25 DIAGNOSIS — I10 Essential (primary) hypertension: Secondary | ICD-10-CM | POA: Diagnosis not present

## 2016-07-25 DIAGNOSIS — R609 Edema, unspecified: Secondary | ICD-10-CM | POA: Diagnosis not present

## 2016-07-25 DIAGNOSIS — I739 Peripheral vascular disease, unspecified: Secondary | ICD-10-CM | POA: Diagnosis not present

## 2016-07-26 DIAGNOSIS — I519 Heart disease, unspecified: Secondary | ICD-10-CM | POA: Diagnosis not present

## 2016-07-26 DIAGNOSIS — I10 Essential (primary) hypertension: Secondary | ICD-10-CM | POA: Diagnosis not present

## 2016-07-26 DIAGNOSIS — I4891 Unspecified atrial fibrillation: Secondary | ICD-10-CM | POA: Diagnosis not present

## 2016-07-26 DIAGNOSIS — I739 Peripheral vascular disease, unspecified: Secondary | ICD-10-CM | POA: Diagnosis not present

## 2016-07-26 DIAGNOSIS — R609 Edema, unspecified: Secondary | ICD-10-CM | POA: Diagnosis not present

## 2016-07-26 DIAGNOSIS — I509 Heart failure, unspecified: Secondary | ICD-10-CM | POA: Diagnosis not present

## 2016-07-27 DIAGNOSIS — I519 Heart disease, unspecified: Secondary | ICD-10-CM | POA: Diagnosis not present

## 2016-07-27 DIAGNOSIS — I10 Essential (primary) hypertension: Secondary | ICD-10-CM | POA: Diagnosis not present

## 2016-07-27 DIAGNOSIS — I509 Heart failure, unspecified: Secondary | ICD-10-CM | POA: Diagnosis not present

## 2016-07-27 DIAGNOSIS — I739 Peripheral vascular disease, unspecified: Secondary | ICD-10-CM | POA: Diagnosis not present

## 2016-07-27 DIAGNOSIS — I4891 Unspecified atrial fibrillation: Secondary | ICD-10-CM | POA: Diagnosis not present

## 2016-07-27 DIAGNOSIS — R609 Edema, unspecified: Secondary | ICD-10-CM | POA: Diagnosis not present

## 2016-07-30 DIAGNOSIS — I509 Heart failure, unspecified: Secondary | ICD-10-CM | POA: Diagnosis not present

## 2016-07-30 DIAGNOSIS — R609 Edema, unspecified: Secondary | ICD-10-CM | POA: Diagnosis not present

## 2016-07-30 DIAGNOSIS — I519 Heart disease, unspecified: Secondary | ICD-10-CM | POA: Diagnosis not present

## 2016-07-30 DIAGNOSIS — I10 Essential (primary) hypertension: Secondary | ICD-10-CM | POA: Diagnosis not present

## 2016-07-30 DIAGNOSIS — I4891 Unspecified atrial fibrillation: Secondary | ICD-10-CM | POA: Diagnosis not present

## 2016-07-30 DIAGNOSIS — I739 Peripheral vascular disease, unspecified: Secondary | ICD-10-CM | POA: Diagnosis not present

## 2016-08-01 ENCOUNTER — Other Ambulatory Visit: Payer: Self-pay | Admitting: *Deleted

## 2016-08-01 DIAGNOSIS — I739 Peripheral vascular disease, unspecified: Secondary | ICD-10-CM | POA: Diagnosis not present

## 2016-08-01 DIAGNOSIS — I10 Essential (primary) hypertension: Secondary | ICD-10-CM | POA: Diagnosis not present

## 2016-08-01 DIAGNOSIS — R609 Edema, unspecified: Secondary | ICD-10-CM | POA: Diagnosis not present

## 2016-08-01 DIAGNOSIS — I4891 Unspecified atrial fibrillation: Secondary | ICD-10-CM | POA: Diagnosis not present

## 2016-08-01 DIAGNOSIS — I519 Heart disease, unspecified: Secondary | ICD-10-CM | POA: Diagnosis not present

## 2016-08-01 DIAGNOSIS — I509 Heart failure, unspecified: Secondary | ICD-10-CM | POA: Diagnosis not present

## 2016-08-01 MED ORDER — TRAMADOL HCL 50 MG PO TABS: 50.0000 mg | ORAL_TABLET | Freq: Every day | ORAL | 0 refills | Status: DC

## 2016-08-01 NOTE — Telephone Encounter (Signed)
[facility] #[phone] Fax: [phone]  

## 2016-08-02 DIAGNOSIS — R609 Edema, unspecified: Secondary | ICD-10-CM | POA: Diagnosis not present

## 2016-08-02 DIAGNOSIS — I10 Essential (primary) hypertension: Secondary | ICD-10-CM | POA: Diagnosis not present

## 2016-08-02 DIAGNOSIS — I4891 Unspecified atrial fibrillation: Secondary | ICD-10-CM | POA: Diagnosis not present

## 2016-08-02 DIAGNOSIS — I519 Heart disease, unspecified: Secondary | ICD-10-CM | POA: Diagnosis not present

## 2016-08-02 DIAGNOSIS — I739 Peripheral vascular disease, unspecified: Secondary | ICD-10-CM | POA: Diagnosis not present

## 2016-08-02 DIAGNOSIS — I509 Heart failure, unspecified: Secondary | ICD-10-CM | POA: Diagnosis not present

## 2016-08-03 DIAGNOSIS — I509 Heart failure, unspecified: Secondary | ICD-10-CM | POA: Diagnosis not present

## 2016-08-03 DIAGNOSIS — I10 Essential (primary) hypertension: Secondary | ICD-10-CM | POA: Diagnosis not present

## 2016-08-03 DIAGNOSIS — R609 Edema, unspecified: Secondary | ICD-10-CM | POA: Diagnosis not present

## 2016-08-03 DIAGNOSIS — I519 Heart disease, unspecified: Secondary | ICD-10-CM | POA: Diagnosis not present

## 2016-08-03 DIAGNOSIS — I739 Peripheral vascular disease, unspecified: Secondary | ICD-10-CM | POA: Diagnosis not present

## 2016-08-03 DIAGNOSIS — I4891 Unspecified atrial fibrillation: Secondary | ICD-10-CM | POA: Diagnosis not present

## 2016-08-06 DIAGNOSIS — R609 Edema, unspecified: Secondary | ICD-10-CM | POA: Diagnosis not present

## 2016-08-06 DIAGNOSIS — I519 Heart disease, unspecified: Secondary | ICD-10-CM | POA: Diagnosis not present

## 2016-08-06 DIAGNOSIS — I739 Peripheral vascular disease, unspecified: Secondary | ICD-10-CM | POA: Diagnosis not present

## 2016-08-06 DIAGNOSIS — I4891 Unspecified atrial fibrillation: Secondary | ICD-10-CM | POA: Diagnosis not present

## 2016-08-06 DIAGNOSIS — I10 Essential (primary) hypertension: Secondary | ICD-10-CM | POA: Diagnosis not present

## 2016-08-06 DIAGNOSIS — I509 Heart failure, unspecified: Secondary | ICD-10-CM | POA: Diagnosis not present

## 2016-08-08 DIAGNOSIS — R609 Edema, unspecified: Secondary | ICD-10-CM | POA: Diagnosis not present

## 2016-08-08 DIAGNOSIS — I739 Peripheral vascular disease, unspecified: Secondary | ICD-10-CM | POA: Diagnosis not present

## 2016-08-08 DIAGNOSIS — I509 Heart failure, unspecified: Secondary | ICD-10-CM | POA: Diagnosis not present

## 2016-08-08 DIAGNOSIS — I4891 Unspecified atrial fibrillation: Secondary | ICD-10-CM | POA: Diagnosis not present

## 2016-08-08 DIAGNOSIS — I10 Essential (primary) hypertension: Secondary | ICD-10-CM | POA: Diagnosis not present

## 2016-08-08 DIAGNOSIS — I519 Heart disease, unspecified: Secondary | ICD-10-CM | POA: Diagnosis not present

## 2016-08-10 DIAGNOSIS — I10 Essential (primary) hypertension: Secondary | ICD-10-CM | POA: Diagnosis not present

## 2016-08-10 DIAGNOSIS — R609 Edema, unspecified: Secondary | ICD-10-CM | POA: Diagnosis not present

## 2016-08-10 DIAGNOSIS — I4891 Unspecified atrial fibrillation: Secondary | ICD-10-CM | POA: Diagnosis not present

## 2016-08-10 DIAGNOSIS — I519 Heart disease, unspecified: Secondary | ICD-10-CM | POA: Diagnosis not present

## 2016-08-10 DIAGNOSIS — I509 Heart failure, unspecified: Secondary | ICD-10-CM | POA: Diagnosis not present

## 2016-08-10 DIAGNOSIS — I739 Peripheral vascular disease, unspecified: Secondary | ICD-10-CM | POA: Diagnosis not present

## 2016-08-13 DIAGNOSIS — R609 Edema, unspecified: Secondary | ICD-10-CM | POA: Diagnosis not present

## 2016-08-13 DIAGNOSIS — I739 Peripheral vascular disease, unspecified: Secondary | ICD-10-CM | POA: Diagnosis not present

## 2016-08-13 DIAGNOSIS — I4891 Unspecified atrial fibrillation: Secondary | ICD-10-CM | POA: Diagnosis not present

## 2016-08-13 DIAGNOSIS — I509 Heart failure, unspecified: Secondary | ICD-10-CM | POA: Diagnosis not present

## 2016-08-13 DIAGNOSIS — I519 Heart disease, unspecified: Secondary | ICD-10-CM | POA: Diagnosis not present

## 2016-08-13 DIAGNOSIS — I10 Essential (primary) hypertension: Secondary | ICD-10-CM | POA: Diagnosis not present

## 2016-08-14 DIAGNOSIS — I739 Peripheral vascular disease, unspecified: Secondary | ICD-10-CM | POA: Diagnosis not present

## 2016-08-14 DIAGNOSIS — I10 Essential (primary) hypertension: Secondary | ICD-10-CM | POA: Diagnosis not present

## 2016-08-14 DIAGNOSIS — I509 Heart failure, unspecified: Secondary | ICD-10-CM | POA: Diagnosis not present

## 2016-08-14 DIAGNOSIS — I519 Heart disease, unspecified: Secondary | ICD-10-CM | POA: Diagnosis not present

## 2016-08-14 DIAGNOSIS — I4891 Unspecified atrial fibrillation: Secondary | ICD-10-CM | POA: Diagnosis not present

## 2016-08-14 DIAGNOSIS — R609 Edema, unspecified: Secondary | ICD-10-CM | POA: Diagnosis not present

## 2016-08-15 DIAGNOSIS — I739 Peripheral vascular disease, unspecified: Secondary | ICD-10-CM | POA: Diagnosis not present

## 2016-08-15 DIAGNOSIS — I4891 Unspecified atrial fibrillation: Secondary | ICD-10-CM | POA: Diagnosis not present

## 2016-08-15 DIAGNOSIS — I509 Heart failure, unspecified: Secondary | ICD-10-CM | POA: Diagnosis not present

## 2016-08-15 DIAGNOSIS — I519 Heart disease, unspecified: Secondary | ICD-10-CM | POA: Diagnosis not present

## 2016-08-15 DIAGNOSIS — I10 Essential (primary) hypertension: Secondary | ICD-10-CM | POA: Diagnosis not present

## 2016-08-15 DIAGNOSIS — R609 Edema, unspecified: Secondary | ICD-10-CM | POA: Diagnosis not present

## 2016-08-17 DIAGNOSIS — I10 Essential (primary) hypertension: Secondary | ICD-10-CM | POA: Diagnosis not present

## 2016-08-17 DIAGNOSIS — R609 Edema, unspecified: Secondary | ICD-10-CM | POA: Diagnosis not present

## 2016-08-17 DIAGNOSIS — I519 Heart disease, unspecified: Secondary | ICD-10-CM | POA: Diagnosis not present

## 2016-08-17 DIAGNOSIS — I509 Heart failure, unspecified: Secondary | ICD-10-CM | POA: Diagnosis not present

## 2016-08-17 DIAGNOSIS — I739 Peripheral vascular disease, unspecified: Secondary | ICD-10-CM | POA: Diagnosis not present

## 2016-08-17 DIAGNOSIS — I4891 Unspecified atrial fibrillation: Secondary | ICD-10-CM | POA: Diagnosis not present

## 2016-08-19 DIAGNOSIS — J449 Chronic obstructive pulmonary disease, unspecified: Secondary | ICD-10-CM | POA: Diagnosis not present

## 2016-08-19 DIAGNOSIS — K219 Gastro-esophageal reflux disease without esophagitis: Secondary | ICD-10-CM | POA: Diagnosis not present

## 2016-08-19 DIAGNOSIS — I4891 Unspecified atrial fibrillation: Secondary | ICD-10-CM | POA: Diagnosis not present

## 2016-08-19 DIAGNOSIS — N390 Urinary tract infection, site not specified: Secondary | ICD-10-CM | POA: Diagnosis not present

## 2016-08-19 DIAGNOSIS — I509 Heart failure, unspecified: Secondary | ICD-10-CM | POA: Diagnosis not present

## 2016-08-19 DIAGNOSIS — I519 Heart disease, unspecified: Secondary | ICD-10-CM | POA: Diagnosis not present

## 2016-08-19 DIAGNOSIS — I10 Essential (primary) hypertension: Secondary | ICD-10-CM | POA: Diagnosis not present

## 2016-08-19 DIAGNOSIS — R609 Edema, unspecified: Secondary | ICD-10-CM | POA: Diagnosis not present

## 2016-08-19 DIAGNOSIS — R0902 Hypoxemia: Secondary | ICD-10-CM | POA: Diagnosis not present

## 2016-08-19 DIAGNOSIS — I739 Peripheral vascular disease, unspecified: Secondary | ICD-10-CM | POA: Diagnosis not present

## 2016-08-19 DIAGNOSIS — H409 Unspecified glaucoma: Secondary | ICD-10-CM | POA: Diagnosis not present

## 2016-08-19 DIAGNOSIS — E559 Vitamin D deficiency, unspecified: Secondary | ICD-10-CM | POA: Diagnosis not present

## 2016-08-19 DIAGNOSIS — H11149 Conjunctival xerosis, unspecified, unspecified eye: Secondary | ICD-10-CM | POA: Diagnosis not present

## 2016-08-19 DIAGNOSIS — F339 Major depressive disorder, recurrent, unspecified: Secondary | ICD-10-CM | POA: Diagnosis not present

## 2016-08-20 ENCOUNTER — Encounter: Payer: Self-pay | Admitting: Internal Medicine

## 2016-08-20 ENCOUNTER — Non-Acute Institutional Stay (SKILLED_NURSING_FACILITY): Payer: Medicare Other | Admitting: Internal Medicine

## 2016-08-20 DIAGNOSIS — I739 Peripheral vascular disease, unspecified: Secondary | ICD-10-CM | POA: Diagnosis not present

## 2016-08-20 DIAGNOSIS — K219 Gastro-esophageal reflux disease without esophagitis: Secondary | ICD-10-CM

## 2016-08-20 DIAGNOSIS — R609 Edema, unspecified: Secondary | ICD-10-CM | POA: Diagnosis not present

## 2016-08-20 DIAGNOSIS — J9611 Chronic respiratory failure with hypoxia: Secondary | ICD-10-CM

## 2016-08-20 DIAGNOSIS — J42 Unspecified chronic bronchitis: Secondary | ICD-10-CM

## 2016-08-20 DIAGNOSIS — I509 Heart failure, unspecified: Secondary | ICD-10-CM | POA: Diagnosis not present

## 2016-08-20 DIAGNOSIS — I519 Heart disease, unspecified: Secondary | ICD-10-CM | POA: Diagnosis not present

## 2016-08-20 DIAGNOSIS — I10 Essential (primary) hypertension: Secondary | ICD-10-CM | POA: Diagnosis not present

## 2016-08-20 DIAGNOSIS — I4891 Unspecified atrial fibrillation: Secondary | ICD-10-CM | POA: Diagnosis not present

## 2016-08-20 NOTE — Progress Notes (Signed)
Location:  [place] Room Number: [place] of Service:  SNF [phone])  [name], MD  Patient Care Team: [name], MD as PCP - General (Internal Medicine)  Extended Emergency Contact Information Primary Emergency Contact: [name],[name] Address: [address]          [place], [place] [address] [name] of [place] Phone: [phone] Relation: Daughter    Allergies: Lyrica [pregabalin] and Penicillins  Chief Complaint  Patient presents with  . Medical Management of Chronic Issues    Routine Visit    HPI: Patient is 82 y.o. female who is being seen for routine issues of COPD, chronic resp failure and GERD.   Past Medical History:  Diagnosis Date  . Atrial fibrillation ([place])   . CHF (congestive heart failure) ([place])   . COPD (chronic obstructive pulmonary disease) ([place])   . Glaucoma   . Hypertension     Past Surgical History:  Procedure Laterality Date  . FEMUR IM NAIL Left 09/09/2015   Procedure: INTRAMEDULLARY (IM) NAIL FEMORAL;  Surgeon: Rod Can, MD;  Location: [facility];  Service: Orthopedics;  Laterality: Left;  . HERNIA REPAIR        Medication List       Accurate as of 08/20/16 11:59 PM. Always use your most recent med list.          acetaminophen 325 MG tablet Commonly known as:  TYLENOL Take 650 mg by mouth every 4 (four) hours as needed for moderate pain.   aspirin 325 MG EC tablet Take 325 mg by mouth daily.   bisacodyl 5 MG EC tablet Commonly known as:  DULCOLAX Take 10 mg by mouth daily as needed for moderate constipation. Reported on 02/14/2016   brimonidine 0.1 % Soln Commonly known as:  ALPHAGAN P Place 1 drop into both eyes 2 (two) times daily.   ENSURE PLUS Liqd Take 1 Can by mouth 2 (two) times daily between meals. Offer at bedtime and in between meals   furosemide 40 MG tablet Commonly known as:  LASIX Take 40 mg by mouth daily.   gabapentin 300 MG capsule Commonly known  as:  NEURONTIN Take 300 mg by mouth at bedtime.   guaiFENesin 600 MG 12 hr tablet Commonly known as:  MUCINEX Take 600 mg by mouth 2 (two) times daily.   guaiFENesin 100 MG/5ML liquid Commonly known as:  ROBITUSSIN Give 10 cc by mouth every 4 hours as needed for cough   HYDROcodone-acetaminophen 5-325 MG tablet Commonly known as:  NORCO/VICODIN Take 1 tablet by mouth every 4 (four) hours as needed for moderate pain or severe pain. No more than 3 grams of Tylenol in 24 hours   ICAPS AREDS 2 Caps Take 1 capsule by mouth 2 (two) times daily.   ipratropium 0.02 % nebulizer solution Commonly known as:  ATROVENT Take 0.5 mg by nebulization every 8 (eight) hours as needed for wheezing or shortness of breath.   latanoprost 0.005 % ophthalmic solution Commonly known as:  XALATAN Place 1 drop into both eyes at bedtime.   Melatonin 1 MG Tabs Take 1 mg by mouth at bedtime.   metoprolol succinate 50 MG 24 hr tablet Commonly known as:  TOPROL-XL Take 50 mg by mouth daily. Take with or immediately following a meal.   mirtazapine 30 MG tablet Commonly known as:  REMERON Take 30 mg by mouth at bedtime.   pantoprazole 40 MG tablet Commonly known as:  PROTONIX Take  40 mg by mouth daily.   polyethylene glycol packet Commonly known as:  MIRALAX / GLYCOLAX Take 17 g by mouth every other day.   potassium chloride 10 MEQ CR capsule Commonly known as:  MICRO-K Take 10 mEq by mouth daily.   spironolactone 12.5 mg Tabs tablet Commonly known as:  ALDACTONE Take 6.25 mg by mouth daily.   traMADol 50 MG tablet Commonly known as:  ULTRAM Take 1 tablet (50 mg total) by mouth at bedtime.   Vitamin D3 5000 units Caps Take 5,000 Units by mouth daily.       No orders of the defined types were placed in this encounter.   Immunization History  Administered Date(s) Administered  . Influenza-Unspecified 08/16/2016  . PPD Test 10/31/2015, 11/17/2015    Social History  Substance Use  Topics  . Smoking status: Never Smoker  . Smokeless tobacco: Never Used  . Alcohol use No    Review of Systems  DATA OBTAINED: from patient GENERAL:  no fevers, fatigue, appetite changes SKIN: No itching, rash HEENT: No complaint RESPIRATORY: No cough, wheezing, SOB CARDIAC: No chest pain, palpitations, lower extremity edema  GI: No abdominal pain, No N/V/D or constipation, No heartburn or reflux  GU: No dysuria, frequency or urgency, or incontinence  MUSCULOSKELETAL: No unrelieved bone/joint pain NEUROLOGIC: No headache, dizziness  PSYCHIATRIC: No overt anxiety or sadness  Vitals:   08/20/16 1110  BP: 105/65  Pulse: 84  Resp: 20  Temp: 98.5 F (36.9 C)   Body mass index is 22.52 kg/m. Physical Exam  GENERAL APPEARANCE: Alert, conversant, No acute distress  SKIN: No diaphoresis rash HEENT: Unremarkable RESPIRATORY: Breathing is even, unlabored. Lung sounds are clear   CARDIOVASCULAR: Heart RRR no murmurs, rubs or gallops. + peripheral edema  GASTROINTESTINAL: Abdomen is soft, non-tender, not distended w/ normal bowel sounds.  GENITOURINARY: Bladder non tender, not distended  MUSCULOSKELETAL: No abnormal joints or musculature NEUROLOGIC: Cranial nerves 2-12 grossly intact. Moves all extremities PSYCHIATRIC: Mood and affect appropriate to situation with dementia, no behavioral issues  Patient Active Problem List   Diagnosis Date Noted  . Depression 07/05/2016  . GERD (gastroesophageal reflux disease) 06/10/2016  . Cerumen debris on tympanic membrane 03/14/2016  . PVD (peripheral vascular disease) ([place]) 01/24/2016  . Dementia without behavioral disturbance 01/01/2016  . Encounter for family conference with patient present 01/01/2016  . Aspiration of food 12/17/2015  . Speech abnormality 11/29/2015  . Senile purpura ([place]) 11/29/2015  . Hip pain, acute 11/06/2015  . Acute encephalopathy 11/06/2015  . Bilateral lower extremity edema 11/06/2015  . UTI (urinary tract  infection) 11/06/2015  . Chronic respiratory failure with hypoxia ([place]) 11/06/2015  . Closed left hip fracture ([place]) 09/09/2015  . Chronic diastolic CHF (congestive heart failure) ([place]) 09/09/2015  . COPD (chronic obstructive pulmonary disease) ([place])   . Atrial fibrillation ([place])   . Hypertensive heart disease with CHF (congestive heart failure) ([place])   . Chronic bronchitis ([place])   . Essential hypertension   . Fall   . Closed pertrochanteric fracture ([facility])     CMP     Component Value Date/Time   NA 141 01/16/2016   K 3.9 01/16/2016   CL 107 09/13/2015 0448   CO2 24 09/13/2015 0448   GLUCOSE 101 (H) 09/13/2015 0448   BUN 11 01/16/2016   CREATININE 0.6 01/16/2016   CREATININE 0.66 09/13/2015 0448   CALCIUM 8.7 (L) 09/13/2015 0448   PROT 6.9 09/08/2015 2325   ALBUMIN 4.1 09/08/2015 2325  AST 26 09/08/2015 2325   ALT 11 (L) 09/08/2015 2325   ALKPHOS 56 09/08/2015 2325   BILITOT 1.3 (H) 09/08/2015 2325   GFRNONAA >60 09/13/2015 0448   GFRAA >60 09/13/2015 0448    Recent Labs  09/11/15 0513 09/12/15 0434 09/13/15 0448 01/16/16  NA 138 138 138 141  K 4.2 4.2 4.3 3.9  CL 106 109 107  --   CO2 25 24 24   --   GLUCOSE 118* 103* 101*  --   BUN 24* 21* 16 11  CREATININE 1.06* 0.88 0.66 0.6  CALCIUM 7.9* 8.1* 8.7*  --     Recent Labs  09/08/15 2325  AST 26  ALT 11*  ALKPHOS 56  BILITOT 1.3*  PROT 6.9  ALBUMIN 4.1    Recent Labs  09/08/15 2325  09/11/15 0513 09/12/15 0434 09/13/15 0448 12/22/15  WBC 8.6  < > 8.4 7.6 9.0 4.0  NEUTROABS 6.5  --   --   --   --   --   HGB 12.8  < > 6.8* 9.0* 9.4* 12.9  HCT 38.8  < > 20.3* 26.7* 28.7* 40  MCV 99.7  < > 101.5* 97.4 99.3  --   PLT 128*  < > 88* 98* 137* 127*  < > = values in this interval not displayed. No results for input(s): CHOL, LDLCALC, TRIG in the last 8760 hours.  Invalid input(s): HCL No results found for: [facility] Lab Results  Component Value Date   TSH 4.26 12/22/2015   No results found for:  HGBA1C No results found for: CHOL, HDL, LDLCALC, LDLDIRECT, TRIG, CHOLHDL  Significant Diagnostic Results in last 30 days:  No results found.  Assessment and Plan  COPD (chronic obstructive pulmonary disease) ([place]) No recent flares;well  On prn atrovent and micinex 600 mg BID  Chronic respiratory failure with hypoxia ([place]) Pt on chronic O2 but sometimes she is not wearing it; She has no SOB or work of breathing on O2 and not noticeable when she is off O2; encourage wearing  chronic O2  GERD (gastroesophageal reflux disease) No signs or sx of reflux;plAN TO CONT PROTONIX 40 MG DAILY     Neila Teem D. Sheppard Coil, MD

## 2016-08-22 DIAGNOSIS — I519 Heart disease, unspecified: Secondary | ICD-10-CM | POA: Diagnosis not present

## 2016-08-22 DIAGNOSIS — I10 Essential (primary) hypertension: Secondary | ICD-10-CM | POA: Diagnosis not present

## 2016-08-22 DIAGNOSIS — I509 Heart failure, unspecified: Secondary | ICD-10-CM | POA: Diagnosis not present

## 2016-08-22 DIAGNOSIS — I739 Peripheral vascular disease, unspecified: Secondary | ICD-10-CM | POA: Diagnosis not present

## 2016-08-22 DIAGNOSIS — I4891 Unspecified atrial fibrillation: Secondary | ICD-10-CM | POA: Diagnosis not present

## 2016-08-22 DIAGNOSIS — R609 Edema, unspecified: Secondary | ICD-10-CM | POA: Diagnosis not present

## 2016-08-23 DIAGNOSIS — R609 Edema, unspecified: Secondary | ICD-10-CM | POA: Diagnosis not present

## 2016-08-23 DIAGNOSIS — I509 Heart failure, unspecified: Secondary | ICD-10-CM | POA: Diagnosis not present

## 2016-08-23 DIAGNOSIS — I519 Heart disease, unspecified: Secondary | ICD-10-CM | POA: Diagnosis not present

## 2016-08-23 DIAGNOSIS — I10 Essential (primary) hypertension: Secondary | ICD-10-CM | POA: Diagnosis not present

## 2016-08-23 DIAGNOSIS — I4891 Unspecified atrial fibrillation: Secondary | ICD-10-CM | POA: Diagnosis not present

## 2016-08-23 DIAGNOSIS — I739 Peripheral vascular disease, unspecified: Secondary | ICD-10-CM | POA: Diagnosis not present

## 2016-08-24 DIAGNOSIS — I10 Essential (primary) hypertension: Secondary | ICD-10-CM | POA: Diagnosis not present

## 2016-08-24 DIAGNOSIS — I519 Heart disease, unspecified: Secondary | ICD-10-CM | POA: Diagnosis not present

## 2016-08-24 DIAGNOSIS — I509 Heart failure, unspecified: Secondary | ICD-10-CM | POA: Diagnosis not present

## 2016-08-24 DIAGNOSIS — I4891 Unspecified atrial fibrillation: Secondary | ICD-10-CM | POA: Diagnosis not present

## 2016-08-24 DIAGNOSIS — I739 Peripheral vascular disease, unspecified: Secondary | ICD-10-CM | POA: Diagnosis not present

## 2016-08-24 DIAGNOSIS — R609 Edema, unspecified: Secondary | ICD-10-CM | POA: Diagnosis not present

## 2016-08-27 DIAGNOSIS — R609 Edema, unspecified: Secondary | ICD-10-CM | POA: Diagnosis not present

## 2016-08-27 DIAGNOSIS — I509 Heart failure, unspecified: Secondary | ICD-10-CM | POA: Diagnosis not present

## 2016-08-27 DIAGNOSIS — I4891 Unspecified atrial fibrillation: Secondary | ICD-10-CM | POA: Diagnosis not present

## 2016-08-27 DIAGNOSIS — I739 Peripheral vascular disease, unspecified: Secondary | ICD-10-CM | POA: Diagnosis not present

## 2016-08-27 DIAGNOSIS — I10 Essential (primary) hypertension: Secondary | ICD-10-CM | POA: Diagnosis not present

## 2016-08-27 DIAGNOSIS — I519 Heart disease, unspecified: Secondary | ICD-10-CM | POA: Diagnosis not present

## 2016-08-29 ENCOUNTER — Non-Acute Institutional Stay (SKILLED_NURSING_FACILITY): Payer: Medicare Other | Admitting: Internal Medicine

## 2016-08-29 ENCOUNTER — Encounter: Payer: Self-pay | Admitting: Internal Medicine

## 2016-08-29 DIAGNOSIS — R600 Localized edema: Secondary | ICD-10-CM | POA: Diagnosis not present

## 2016-08-29 DIAGNOSIS — I4891 Unspecified atrial fibrillation: Secondary | ICD-10-CM | POA: Diagnosis not present

## 2016-08-29 DIAGNOSIS — I10 Essential (primary) hypertension: Secondary | ICD-10-CM | POA: Diagnosis not present

## 2016-08-29 DIAGNOSIS — I509 Heart failure, unspecified: Secondary | ICD-10-CM | POA: Diagnosis not present

## 2016-08-29 DIAGNOSIS — I519 Heart disease, unspecified: Secondary | ICD-10-CM | POA: Diagnosis not present

## 2016-08-29 DIAGNOSIS — I739 Peripheral vascular disease, unspecified: Secondary | ICD-10-CM | POA: Diagnosis not present

## 2016-08-29 DIAGNOSIS — R609 Edema, unspecified: Secondary | ICD-10-CM | POA: Diagnosis not present

## 2016-08-29 NOTE — Progress Notes (Signed)
Location:  [place] Room Number: [address] of Service:  SNF [phone])  [name], MD  Patient Care Team: [name], MD as PCP - General (Internal Medicine)  Extended Emergency Contact Information Primary Emergency Contact: [name],[name] Address: [address]          [place], [place] [address] [name] of [place] Phone: [phone] Relation: Daughter    Allergies: Lyrica [pregabalin] and Penicillins  Chief Complaint  Patient presents with  . Acute Visit    Acute    HPI: Patient is 82 y.o. female who nursing sked me to see for LE edema. Pt has had this problem before, she will not elevate her feet. If she is in bed long enough all the swelling goes away. She is wearing compression stockings now and doesn't like them.Pt denies SOB.  Past Medical History:  Diagnosis Date  . Atrial fibrillation ([place])   . CHF (congestive heart failure) ([place])   . COPD (chronic obstructive pulmonary disease) ([place])   . Glaucoma   . Hypertension     Past Surgical History:  Procedure Laterality Date  . FEMUR IM NAIL Left 09/09/2015   Procedure: INTRAMEDULLARY (IM) NAIL FEMORAL;  Surgeon: Rod Can, MD;  Location: [facility];  Service: Orthopedics;  Laterality: Left;  . HERNIA REPAIR        Medication List       Accurate as of 08/29/16  3:33 PM. Always use your most recent med list.          acetaminophen 325 MG tablet Commonly known as:  TYLENOL Take 650 mg by mouth every 4 (four) hours as needed for moderate pain.   aspirin 325 MG EC tablet Take 325 mg by mouth daily.   bisacodyl 5 MG EC tablet Commonly known as:  DULCOLAX Take 10 mg by mouth daily as needed for moderate constipation. Reported on 02/14/2016   brimonidine 0.1 % Soln Commonly known as:  ALPHAGAN P Place 1 drop into both eyes 2 (two) times daily.   ENSURE PLUS Liqd Take 1 Can by mouth 2 (two) times daily between meals. Offer at bedtime and in between meals   furosemide 40 MG tablet Commonly known as:  LASIX Take 40 mg by mouth daily.   gabapentin 300 MG capsule Commonly known as:  NEURONTIN Take 300 mg by mouth at bedtime.   guaiFENesin 600 MG 12 hr tablet Commonly known as:  MUCINEX Take 600 mg by mouth 2 (two) times daily.   guaiFENesin 100 MG/5ML liquid Commonly known as:  ROBITUSSIN Give 10 cc by mouth every 4 hours as needed for cough   HYDROcodone-acetaminophen 5-325 MG tablet Commonly known as:  NORCO/VICODIN Take 1 tablet by mouth every 4 (four) hours as needed for moderate pain or severe pain. No more than 3 grams of Tylenol in 24 hours   ICAPS AREDS 2 Caps Take 1 capsule by mouth 2 (two) times daily.   ipratropium 0.02 % nebulizer solution Commonly known as:  ATROVENT Take 0.5 mg by nebulization every 8 (eight) hours as needed for wheezing or shortness of breath.   latanoprost 0.005 % ophthalmic solution Commonly known as:  XALATAN Place 1 drop into both eyes at bedtime.   Melatonin 1 MG Tabs Take 1 mg by mouth at bedtime.   metoprolol succinate 50 MG 24 hr tablet Commonly known as:  TOPROL-XL Take 50 mg by mouth daily. Take with or immediately following a meal.   mirtazapine 30 MG  tablet Commonly known as:  REMERON Take 30 mg by mouth at bedtime.   pantoprazole 40 MG tablet Commonly known as:  PROTONIX Take 40 mg by mouth daily.   polyethylene glycol packet Commonly known as:  MIRALAX / GLYCOLAX Take 17 g by mouth every other day.   potassium chloride 10 MEQ CR capsule Commonly known as:  MICRO-K Take 10 mEq by mouth daily.   spironolactone 12.5 mg Tabs tablet Commonly known as:  ALDACTONE Take 6.25 mg by mouth daily.   traMADol 50 MG tablet Commonly known as:  ULTRAM Take 1 tablet (50 mg total) by mouth at bedtime.   Vitamin D3 5000 units Caps Take 5,000 Units by mouth daily.       No orders of the defined types were placed in this encounter.   Immunization History  Administered  Date(s) Administered  . Influenza-Unspecified 08/16/2016  . PPD Test 10/31/2015, 11/17/2015    Social History  Substance Use Topics  . Smoking status: Never Smoker  . Smokeless tobacco: Never Used  . Alcohol use No    Review of Systems  DATA OBTAINED: from patient, nurse GENERAL:  no fevers, fatigue, appetite changes SKIN: No itching, rash HEENT: No complaint RESPIRATORY: No cough, wheezing, SOB CARDIAC: No chest pain, palpitations,+ lower extremity edema  GI: No abdominal pain, No N/V/D or constipation, No heartburn or reflux  GU: No dysuria, frequency or urgency, or incontinence  MUSCULOSKELETAL: No unrelieved bone/joint pain NEUROLOGIC: No headache, dizziness  PSYCHIATRIC: No overt anxiety or sadness  Vitals:   08/29/16 1522  BP: 105/65  Pulse: 89  Resp: 16  Temp: 98.5 F (36.9 C)   Body mass index is 23.24 kg/m. Physical Exam  GENERAL APPEARANCE: Alert, conversant, No acute distress  SKIN: No diaphoresis rash HEENT: Unremarkable RESPIRATORY: Breathing is even, unlabored. Lung sounds are clear   CARDIOVASCULAR: Heart RRR no murmurs, rubs or gallops. 1- 1 1/2+  peripheral edema  GASTROINTESTINAL: Abdomen is soft, non-tender, not distended w/ normal bowel sounds.  GENITOURINARY: Bladder non tender, not distended  MUSCULOSKELETAL: No abnormal joints or musculature NEUROLOGIC: Cranial nerves 2-12 grossly intact. Moves all extremities PSYCHIATRIC: Mood and affect appropriate to situation with some dementia, no behavioral issues  Patient Active Problem List   Diagnosis Date Noted  . Depression 07/05/2016  . GERD (gastroesophageal reflux disease) 06/10/2016  . Cerumen debris on tympanic membrane 03/14/2016  . PVD (peripheral vascular disease) ([place]) 01/24/2016  . Dementia without behavioral disturbance 01/01/2016  . Encounter for family conference with patient present 01/01/2016  . Aspiration of food 12/17/2015  . Speech abnormality 11/29/2015  . Senile purpura  ([place]) 11/29/2015  . Hip pain, acute 11/06/2015  . Acute encephalopathy 11/06/2015  . Bilateral lower extremity edema 11/06/2015  . UTI (urinary tract infection) 11/06/2015  . Chronic respiratory failure with hypoxia ([place]) 11/06/2015  . Closed left hip fracture ([place]) 09/09/2015  . Chronic diastolic CHF (congestive heart failure) ([place]) 09/09/2015  . COPD (chronic obstructive pulmonary disease) ([place])   . Atrial fibrillation ([place])   . Hypertensive heart disease with CHF (congestive heart failure) ([place])   . Chronic bronchitis ([place])   . Essential hypertension   . Fall   . Closed pertrochanteric fracture ([facility])     CMP     Component Value Date/Time   NA 141 01/16/2016   K 3.9 01/16/2016   CL 107 09/13/2015 0448   CO2 24 09/13/2015 0448   GLUCOSE 101 (H) 09/13/2015 0448   BUN 11 01/16/2016  CREATININE 0.6 01/16/2016   CREATININE 0.66 09/13/2015 0448   CALCIUM 8.7 (L) 09/13/2015 0448   PROT 6.9 09/08/2015 2325   ALBUMIN 4.1 09/08/2015 2325   AST 26 09/08/2015 2325   ALT 11 (L) 09/08/2015 2325   ALKPHOS 56 09/08/2015 2325   BILITOT 1.3 (H) 09/08/2015 2325   GFRNONAA >60 09/13/2015 0448   GFRAA >60 09/13/2015 0448    Recent Labs  09/11/15 0513 09/12/15 0434 09/13/15 0448 01/16/16  NA 138 138 138 141  K 4.2 4.2 4.3 3.9  CL 106 109 107  --   CO2 25 24 24   --   GLUCOSE 118* 103* 101*  --   BUN 24* 21* 16 11  CREATININE 1.06* 0.88 0.66 0.6  CALCIUM 7.9* 8.1* 8.7*  --     Recent Labs  09/08/15 2325  AST 26  ALT 11*  ALKPHOS 56  BILITOT 1.3*  PROT 6.9  ALBUMIN 4.1    Recent Labs  09/08/15 2325  09/11/15 0513 09/12/15 0434 09/13/15 0448 12/22/15  WBC 8.6  < > 8.4 7.6 9.0 4.0  NEUTROABS 6.5  --   --   --   --   --   HGB 12.8  < > 6.8* 9.0* 9.4* 12.9  HCT 38.8  < > 20.3* 26.7* 28.7* 40  MCV 99.7  < > 101.5* 97.4 99.3  --   PLT 128*  < > 88* 98* 137* 127*  < > = values in this interval not displayed. No results for input(s): CHOL, LDLCALC, TRIG in the last  8760 hours.  Invalid input(s): HCL No results found for: [facility] Lab Results  Component Value Date   TSH 4.26 12/22/2015   No results found for: HGBA1C No results found for: CHOL, HDL, LDLCALC, LDLDIRECT, TRIG, CHOLHDL  Significant Diagnostic Results in last 30 days:  No results found.  Assessment and Plan  LE EDEMA -will inc spironolactone to 12.5 mg daily and inc lasix to 60 mg daily. Will start KCL 20 meq daily ; will monitor    Time spent > 35 mg ;> 50% of time with patient was spent reviewing records, labs, tests and studies, counseling and developing plan of care  Webb Silversmith D. Sheppard Coil, MD

## 2016-08-30 DIAGNOSIS — I509 Heart failure, unspecified: Secondary | ICD-10-CM | POA: Diagnosis not present

## 2016-08-30 DIAGNOSIS — I519 Heart disease, unspecified: Secondary | ICD-10-CM | POA: Diagnosis not present

## 2016-08-30 DIAGNOSIS — R609 Edema, unspecified: Secondary | ICD-10-CM | POA: Diagnosis not present

## 2016-08-30 DIAGNOSIS — I739 Peripheral vascular disease, unspecified: Secondary | ICD-10-CM | POA: Diagnosis not present

## 2016-08-30 DIAGNOSIS — I10 Essential (primary) hypertension: Secondary | ICD-10-CM | POA: Diagnosis not present

## 2016-08-30 DIAGNOSIS — I4891 Unspecified atrial fibrillation: Secondary | ICD-10-CM | POA: Diagnosis not present

## 2016-08-31 ENCOUNTER — Other Ambulatory Visit: Payer: Self-pay | Admitting: *Deleted

## 2016-08-31 DIAGNOSIS — I509 Heart failure, unspecified: Secondary | ICD-10-CM | POA: Diagnosis not present

## 2016-08-31 DIAGNOSIS — I4891 Unspecified atrial fibrillation: Secondary | ICD-10-CM | POA: Diagnosis not present

## 2016-08-31 DIAGNOSIS — W19XXXA Unspecified fall, initial encounter: Secondary | ICD-10-CM | POA: Diagnosis not present

## 2016-08-31 DIAGNOSIS — I10 Essential (primary) hypertension: Secondary | ICD-10-CM | POA: Diagnosis not present

## 2016-08-31 DIAGNOSIS — R609 Edema, unspecified: Secondary | ICD-10-CM | POA: Diagnosis not present

## 2016-08-31 DIAGNOSIS — I519 Heart disease, unspecified: Secondary | ICD-10-CM | POA: Diagnosis not present

## 2016-08-31 DIAGNOSIS — M545 Low back pain: Secondary | ICD-10-CM | POA: Diagnosis not present

## 2016-08-31 DIAGNOSIS — I739 Peripheral vascular disease, unspecified: Secondary | ICD-10-CM | POA: Diagnosis not present

## 2016-08-31 MED ORDER — TRAMADOL HCL 50 MG PO TABS: 50.0000 mg | ORAL_TABLET | Freq: Every day | ORAL | 0 refills | Status: DC

## 2016-08-31 NOTE — Telephone Encounter (Signed)
[facility] #[phone] Fax: [phone]  

## 2016-09-02 ENCOUNTER — Encounter: Payer: Self-pay | Admitting: Internal Medicine

## 2016-09-02 DIAGNOSIS — I4891 Unspecified atrial fibrillation: Secondary | ICD-10-CM | POA: Diagnosis not present

## 2016-09-02 DIAGNOSIS — I10 Essential (primary) hypertension: Secondary | ICD-10-CM | POA: Diagnosis not present

## 2016-09-02 DIAGNOSIS — I509 Heart failure, unspecified: Secondary | ICD-10-CM | POA: Diagnosis not present

## 2016-09-02 DIAGNOSIS — I739 Peripheral vascular disease, unspecified: Secondary | ICD-10-CM | POA: Diagnosis not present

## 2016-09-02 DIAGNOSIS — I519 Heart disease, unspecified: Secondary | ICD-10-CM | POA: Diagnosis not present

## 2016-09-02 DIAGNOSIS — R609 Edema, unspecified: Secondary | ICD-10-CM | POA: Diagnosis not present

## 2016-09-02 NOTE — Assessment & Plan Note (Signed)
No recent flares;well  On prn atrovent and micinex 600 mg BID

## 2016-09-02 NOTE — Assessment & Plan Note (Signed)
No signs or sx of reflux;plAN TO CONT PROTONIX 40 MG DAILY

## 2016-09-02 NOTE — Assessment & Plan Note (Signed)
Pt on chronic O2 but sometimes she is not wearing it; She has no SOB or work of breathing on O2 and not noticeable when she is off O2; encourage wearing  chronic O2

## 2016-09-03 DIAGNOSIS — I509 Heart failure, unspecified: Secondary | ICD-10-CM | POA: Diagnosis not present

## 2016-09-03 DIAGNOSIS — I519 Heart disease, unspecified: Secondary | ICD-10-CM | POA: Diagnosis not present

## 2016-09-03 DIAGNOSIS — I10 Essential (primary) hypertension: Secondary | ICD-10-CM | POA: Diagnosis not present

## 2016-09-03 DIAGNOSIS — I4891 Unspecified atrial fibrillation: Secondary | ICD-10-CM | POA: Diagnosis not present

## 2016-09-03 DIAGNOSIS — I739 Peripheral vascular disease, unspecified: Secondary | ICD-10-CM | POA: Diagnosis not present

## 2016-09-03 DIAGNOSIS — R609 Edema, unspecified: Secondary | ICD-10-CM | POA: Diagnosis not present

## 2016-09-06 DIAGNOSIS — I519 Heart disease, unspecified: Secondary | ICD-10-CM | POA: Diagnosis not present

## 2016-09-06 DIAGNOSIS — I509 Heart failure, unspecified: Secondary | ICD-10-CM | POA: Diagnosis not present

## 2016-09-06 DIAGNOSIS — R609 Edema, unspecified: Secondary | ICD-10-CM | POA: Diagnosis not present

## 2016-09-06 DIAGNOSIS — I10 Essential (primary) hypertension: Secondary | ICD-10-CM | POA: Diagnosis not present

## 2016-09-06 DIAGNOSIS — I4891 Unspecified atrial fibrillation: Secondary | ICD-10-CM | POA: Diagnosis not present

## 2016-09-06 DIAGNOSIS — I739 Peripheral vascular disease, unspecified: Secondary | ICD-10-CM | POA: Diagnosis not present

## 2016-09-07 DIAGNOSIS — R609 Edema, unspecified: Secondary | ICD-10-CM | POA: Diagnosis not present

## 2016-09-07 DIAGNOSIS — I739 Peripheral vascular disease, unspecified: Secondary | ICD-10-CM | POA: Diagnosis not present

## 2016-09-07 DIAGNOSIS — I509 Heart failure, unspecified: Secondary | ICD-10-CM | POA: Diagnosis not present

## 2016-09-07 DIAGNOSIS — I4891 Unspecified atrial fibrillation: Secondary | ICD-10-CM | POA: Diagnosis not present

## 2016-09-07 DIAGNOSIS — I10 Essential (primary) hypertension: Secondary | ICD-10-CM | POA: Diagnosis not present

## 2016-09-07 DIAGNOSIS — I519 Heart disease, unspecified: Secondary | ICD-10-CM | POA: Diagnosis not present

## 2016-09-10 DIAGNOSIS — I739 Peripheral vascular disease, unspecified: Secondary | ICD-10-CM | POA: Diagnosis not present

## 2016-09-10 DIAGNOSIS — I519 Heart disease, unspecified: Secondary | ICD-10-CM | POA: Diagnosis not present

## 2016-09-10 DIAGNOSIS — I509 Heart failure, unspecified: Secondary | ICD-10-CM | POA: Diagnosis not present

## 2016-09-10 DIAGNOSIS — R609 Edema, unspecified: Secondary | ICD-10-CM | POA: Diagnosis not present

## 2016-09-10 DIAGNOSIS — I10 Essential (primary) hypertension: Secondary | ICD-10-CM | POA: Diagnosis not present

## 2016-09-10 DIAGNOSIS — I4891 Unspecified atrial fibrillation: Secondary | ICD-10-CM | POA: Diagnosis not present

## 2016-09-11 DIAGNOSIS — I509 Heart failure, unspecified: Secondary | ICD-10-CM | POA: Diagnosis not present

## 2016-09-11 DIAGNOSIS — I519 Heart disease, unspecified: Secondary | ICD-10-CM | POA: Diagnosis not present

## 2016-09-11 DIAGNOSIS — I10 Essential (primary) hypertension: Secondary | ICD-10-CM | POA: Diagnosis not present

## 2016-09-11 DIAGNOSIS — R609 Edema, unspecified: Secondary | ICD-10-CM | POA: Diagnosis not present

## 2016-09-11 DIAGNOSIS — I4891 Unspecified atrial fibrillation: Secondary | ICD-10-CM | POA: Diagnosis not present

## 2016-09-11 DIAGNOSIS — I739 Peripheral vascular disease, unspecified: Secondary | ICD-10-CM | POA: Diagnosis not present

## 2016-09-12 DIAGNOSIS — I10 Essential (primary) hypertension: Secondary | ICD-10-CM | POA: Diagnosis not present

## 2016-09-12 DIAGNOSIS — I739 Peripheral vascular disease, unspecified: Secondary | ICD-10-CM | POA: Diagnosis not present

## 2016-09-12 DIAGNOSIS — I519 Heart disease, unspecified: Secondary | ICD-10-CM | POA: Diagnosis not present

## 2016-09-12 DIAGNOSIS — R609 Edema, unspecified: Secondary | ICD-10-CM | POA: Diagnosis not present

## 2016-09-12 DIAGNOSIS — I509 Heart failure, unspecified: Secondary | ICD-10-CM | POA: Diagnosis not present

## 2016-09-12 DIAGNOSIS — I4891 Unspecified atrial fibrillation: Secondary | ICD-10-CM | POA: Diagnosis not present

## 2016-09-13 DIAGNOSIS — I739 Peripheral vascular disease, unspecified: Secondary | ICD-10-CM | POA: Diagnosis not present

## 2016-09-13 DIAGNOSIS — I4891 Unspecified atrial fibrillation: Secondary | ICD-10-CM | POA: Diagnosis not present

## 2016-09-13 DIAGNOSIS — R609 Edema, unspecified: Secondary | ICD-10-CM | POA: Diagnosis not present

## 2016-09-13 DIAGNOSIS — I519 Heart disease, unspecified: Secondary | ICD-10-CM | POA: Diagnosis not present

## 2016-09-13 DIAGNOSIS — I10 Essential (primary) hypertension: Secondary | ICD-10-CM | POA: Diagnosis not present

## 2016-09-13 DIAGNOSIS — I509 Heart failure, unspecified: Secondary | ICD-10-CM | POA: Diagnosis not present

## 2016-09-14 DIAGNOSIS — R609 Edema, unspecified: Secondary | ICD-10-CM | POA: Diagnosis not present

## 2016-09-14 DIAGNOSIS — I10 Essential (primary) hypertension: Secondary | ICD-10-CM | POA: Diagnosis not present

## 2016-09-14 DIAGNOSIS — I509 Heart failure, unspecified: Secondary | ICD-10-CM | POA: Diagnosis not present

## 2016-09-14 DIAGNOSIS — I739 Peripheral vascular disease, unspecified: Secondary | ICD-10-CM | POA: Diagnosis not present

## 2016-09-14 DIAGNOSIS — I4891 Unspecified atrial fibrillation: Secondary | ICD-10-CM | POA: Diagnosis not present

## 2016-09-14 DIAGNOSIS — I519 Heart disease, unspecified: Secondary | ICD-10-CM | POA: Diagnosis not present

## 2016-09-17 DIAGNOSIS — I739 Peripheral vascular disease, unspecified: Secondary | ICD-10-CM | POA: Diagnosis not present

## 2016-09-17 DIAGNOSIS — I519 Heart disease, unspecified: Secondary | ICD-10-CM | POA: Diagnosis not present

## 2016-09-17 DIAGNOSIS — I10 Essential (primary) hypertension: Secondary | ICD-10-CM | POA: Diagnosis not present

## 2016-09-17 DIAGNOSIS — I4891 Unspecified atrial fibrillation: Secondary | ICD-10-CM | POA: Diagnosis not present

## 2016-09-17 DIAGNOSIS — I509 Heart failure, unspecified: Secondary | ICD-10-CM | POA: Diagnosis not present

## 2016-09-17 DIAGNOSIS — R609 Edema, unspecified: Secondary | ICD-10-CM | POA: Diagnosis not present

## 2016-09-18 DIAGNOSIS — I509 Heart failure, unspecified: Secondary | ICD-10-CM | POA: Diagnosis not present

## 2016-09-18 DIAGNOSIS — I10 Essential (primary) hypertension: Secondary | ICD-10-CM | POA: Diagnosis not present

## 2016-09-18 DIAGNOSIS — R609 Edema, unspecified: Secondary | ICD-10-CM | POA: Diagnosis not present

## 2016-09-18 DIAGNOSIS — I739 Peripheral vascular disease, unspecified: Secondary | ICD-10-CM | POA: Diagnosis not present

## 2016-09-18 DIAGNOSIS — I519 Heart disease, unspecified: Secondary | ICD-10-CM | POA: Diagnosis not present

## 2016-09-18 DIAGNOSIS — I4891 Unspecified atrial fibrillation: Secondary | ICD-10-CM | POA: Diagnosis not present

## 2016-09-19 ENCOUNTER — Encounter: Payer: Self-pay | Admitting: Internal Medicine

## 2016-09-19 ENCOUNTER — Non-Acute Institutional Stay (SKILLED_NURSING_FACILITY): Payer: Medicare Other | Admitting: Internal Medicine

## 2016-09-19 DIAGNOSIS — I739 Peripheral vascular disease, unspecified: Secondary | ICD-10-CM | POA: Diagnosis not present

## 2016-09-19 DIAGNOSIS — F0280 Dementia in other diseases classified elsewhere without behavioral disturbance: Secondary | ICD-10-CM | POA: Diagnosis not present

## 2016-09-19 DIAGNOSIS — K219 Gastro-esophageal reflux disease without esophagitis: Secondary | ICD-10-CM | POA: Diagnosis not present

## 2016-09-19 DIAGNOSIS — N390 Urinary tract infection, site not specified: Secondary | ICD-10-CM | POA: Diagnosis not present

## 2016-09-19 DIAGNOSIS — I519 Heart disease, unspecified: Secondary | ICD-10-CM | POA: Diagnosis not present

## 2016-09-19 DIAGNOSIS — H11149 Conjunctival xerosis, unspecified, unspecified eye: Secondary | ICD-10-CM | POA: Diagnosis not present

## 2016-09-19 DIAGNOSIS — I10 Essential (primary) hypertension: Secondary | ICD-10-CM | POA: Diagnosis not present

## 2016-09-19 DIAGNOSIS — I509 Heart failure, unspecified: Secondary | ICD-10-CM | POA: Diagnosis not present

## 2016-09-19 DIAGNOSIS — R0902 Hypoxemia: Secondary | ICD-10-CM | POA: Diagnosis not present

## 2016-09-19 DIAGNOSIS — E559 Vitamin D deficiency, unspecified: Secondary | ICD-10-CM | POA: Diagnosis not present

## 2016-09-19 DIAGNOSIS — G301 Alzheimer's disease with late onset: Secondary | ICD-10-CM

## 2016-09-19 DIAGNOSIS — F339 Major depressive disorder, recurrent, unspecified: Secondary | ICD-10-CM | POA: Diagnosis not present

## 2016-09-19 DIAGNOSIS — F329 Major depressive disorder, single episode, unspecified: Secondary | ICD-10-CM | POA: Diagnosis not present

## 2016-09-19 DIAGNOSIS — I4891 Unspecified atrial fibrillation: Secondary | ICD-10-CM | POA: Diagnosis not present

## 2016-09-19 DIAGNOSIS — J449 Chronic obstructive pulmonary disease, unspecified: Secondary | ICD-10-CM | POA: Diagnosis not present

## 2016-09-19 DIAGNOSIS — F32A Depression, unspecified: Secondary | ICD-10-CM

## 2016-09-19 DIAGNOSIS — R609 Edema, unspecified: Secondary | ICD-10-CM | POA: Diagnosis not present

## 2016-09-19 DIAGNOSIS — H409 Unspecified glaucoma: Secondary | ICD-10-CM | POA: Diagnosis not present

## 2016-09-19 NOTE — Progress Notes (Signed)
Location:  [facility] Room Number: 213W Place of Service:  SNF [phone])  [name], MD  Patient Care Team: [name], MD as PCP - General (Internal Medicine)  Extended Emergency Contact Information Primary Emergency Contact: [name],[name] Address: [address]          [place], [place] [address] [name] Phone: [phone] Relation: Daughter    Allergies: Lyrica [pregabalin] and Penicillins  Chief Complaint  Patient presents with  . Medical Management of Chronic Issues    Routine Visit    HPI: Patient is 82 y.o. female who is being seen for routine issues of depression, dementia and PVD.  Past Medical History:  Diagnosis Date  . Atrial fibrillation ([place])   . CHF (congestive heart failure) ([place])   . COPD (chronic obstructive pulmonary disease) ([place])   . Glaucoma   . Hypertension     Past Surgical History:  Procedure Laterality Date  . FEMUR IM NAIL Left 09/09/2015   Procedure: INTRAMEDULLARY (IM) NAIL FEMORAL;  Surgeon: Rod Can, MD;  Location: [facility];  Service: Orthopedics;  Laterality: Left;  . HERNIA REPAIR        Medication List       Accurate as of 09/19/16 11:59 PM. Always use your most recent med list.          acetaminophen 325 MG tablet Commonly known as:  TYLENOL Take 650 mg by mouth every 4 (four) hours as needed for moderate pain.   aspirin 325 MG EC tablet Take 325 mg by mouth daily.   bisacodyl 5 MG EC tablet Commonly known as:  DULCOLAX Take 10 mg by mouth daily as needed for moderate constipation. Reported on 02/14/2016   brimonidine 0.1 % Soln Commonly known as:  ALPHAGAN P Place 1 drop into both eyes 2 (two) times daily.   ENSURE PLUS Liqd Take 1 Can by mouth 2 (two) times daily between meals. Offer at bedtime and in between meals   gabapentin 300 MG capsule Commonly known as:  NEURONTIN Take 300 mg by mouth at bedtime.   guaiFENesin 600 MG 12 hr  tablet Commonly known as:  MUCINEX Take 600 mg by mouth 2 (two) times daily.   guaiFENesin 100 MG/5ML liquid Commonly known as:  ROBITUSSIN Give 10 cc by mouth every 4 hours as needed for cough   HYDROcodone-acetaminophen 5-325 MG tablet Commonly known as:  NORCO/VICODIN Take 1 tablet by mouth every 4 (four) hours as needed for moderate pain or severe pain. No more than 3 grams of Tylenol in 24 hours   ICAPS AREDS 2 Caps Take 1 capsule by mouth 2 (two) times daily.   ipratropium 0.02 % nebulizer solution Commonly known as:  ATROVENT Take 0.5 mg by nebulization every 6 (six) hours as needed for wheezing or shortness of breath.   LASIX 40 MG tablet Generic drug:  furosemide Take 40 mg by mouth daily. Take 1 tab with 20 mg to equal 60 mg by mouth daily   latanoprost 0.005 % ophthalmic solution Commonly known as:  XALATAN Place 1 drop into both eyes at bedtime.   Melatonin 1 MG Tabs Take 1 mg by mouth at bedtime.   metoprolol succinate 50 MG 24 hr tablet Commonly known as:  TOPROL-XL Take 50 mg by mouth daily. Take with or immediately following a meal.   mirtazapine 30 MG tablet Commonly known as:  REMERON Take 30 mg by mouth at bedtime.   pantoprazole 40  MG tablet Commonly known as:  PROTONIX Take 40 mg by mouth daily.   polyethylene glycol packet Commonly known as:  MIRALAX / GLYCOLAX Take 17 g by mouth every other day.   potassium chloride 10 MEQ CR capsule Commonly known as:  MICRO-K Take 20 mEq by mouth daily.   spironolactone 12.5 mg Tabs tablet Commonly known as:  ALDACTONE Take 6.25 mg by mouth daily.   traMADol 50 MG tablet Commonly known as:  ULTRAM Take 1 tablet (50 mg total) by mouth at bedtime.   Vitamin D3 5000 units Caps Take 5,000 Units by mouth daily.       Meds ordered this encounter  Medications  . furosemide (LASIX) 40 MG tablet    Sig: Take 40 mg by mouth daily. Take 1 tab with 20 mg to equal 60 mg by mouth daily    Immunization  History  Administered Date(s) Administered  . Influenza-Unspecified 08/16/2016  . PPD Test 10/31/2015, 11/17/2015    Social History  Substance Use Topics  . Smoking status: Never Smoker  . Smokeless tobacco: Never Used  . Alcohol use No    Review of Systems  DATA OBTAINED: from patient- can not fully participate;nurse - no concerns GENERAL:  no fevers, fatigue, appetite changes SKIN: No itching, rash HEENT: No complaint RESPIRATORY: No cough, wheezing, SOB CARDIAC: No chest pain, palpitations,+ lower extremity edema  GI: No abdominal pain, No N/V/D or constipation, No heartburn or reflux  GU: No dysuria, frequency or urgency, or incontinence  MUSCULOSKELETAL: No unrelieved bone/joint pain NEUROLOGIC: No headache, dizziness  PSYCHIATRIC: No overt anxiety or sadness  Vitals:   09/19/16 0927  BP: 129/70  Pulse: 77  Resp: 16  Temp: 97.3 F (36.3 C)   Body mass index is 23.24 kg/m. Physical Exam  GENERAL APPEARANCE: Alert, conversant, No acute distress  SKIN: No diaphoresis rash HEENT: Unremarkable RESPIRATORY: Breathing is even, unlabored. Lung sounds are clear   CARDIOVASCULAR: Heart RRR no murmurs, rubs or gallops. 1+ peripheral edema , chronic GASTROINTESTINAL: Abdomen is soft, non-tender, not distended w/ normal bowel sounds.  GENITOURINARY: Bladder non tender, not distended  MUSCULOSKELETAL: No abnormal joints or musculature NEUROLOGIC: Cranial nerves 2-12 grossly intact. Moves all extremities PSYCHIATRIC: Mood and affect appropriate to situation with dementia, no behavioral issues  Patient Active Problem List   Diagnosis Date Noted  . Depression 07/05/2016  . GERD (gastroesophageal reflux disease) 06/10/2016  . Cerumen debris on tympanic membrane 03/14/2016  . PVD (peripheral vascular disease) ([place]) 01/24/2016  . Dementia without behavioral disturbance 01/01/2016  . Encounter for family conference with patient present 01/01/2016  . Aspiration of food  12/17/2015  . Speech abnormality 11/29/2015  . Senile purpura ([place]) 11/29/2015  . Hip pain, acute 11/06/2015  . Acute encephalopathy 11/06/2015  . Bilateral lower extremity edema 11/06/2015  . UTI (urinary tract infection) 11/06/2015  . Chronic respiratory failure with hypoxia ([place]) 11/06/2015  . Closed left hip fracture ([place]) 09/09/2015  . Chronic diastolic CHF (congestive heart failure) ([place]) 09/09/2015  . COPD (chronic obstructive pulmonary disease) ([place])   . Atrial fibrillation ([place])   . Hypertensive heart disease with CHF (congestive heart failure) ([place])   . Chronic bronchitis ([place])   . Essential hypertension   . Fall   . Closed pertrochanteric fracture ([facility])     CMP     Component Value Date/Time   NA 141 01/16/2016   K 3.9 01/16/2016   CL 107 09/13/2015 0448   CO2 24 09/13/2015 0448  GLUCOSE 101 (H) 09/13/2015 0448   BUN 11 01/16/2016   CREATININE 0.6 01/16/2016   CREATININE 0.66 09/13/2015 0448   CALCIUM 8.7 (L) 09/13/2015 0448   PROT 6.9 09/08/2015 2325   ALBUMIN 4.1 09/08/2015 2325   AST 26 09/08/2015 2325   ALT 11 (L) 09/08/2015 2325   ALKPHOS 56 09/08/2015 2325   BILITOT 1.3 (H) 09/08/2015 2325   GFRNONAA >60 09/13/2015 0448   GFRAA >60 09/13/2015 0448    Recent Labs  01/16/16  NA 141  K 3.9  BUN 11  CREATININE 0.6   No results for input(s): AST, ALT, ALKPHOS, BILITOT, PROT, ALBUMIN in the last 8760 hours.  Recent Labs  12/22/15  WBC 4.0  HGB 12.9  HCT 40  PLT 127*   No results for input(s): CHOL, LDLCALC, TRIG in the last 8760 hours.  Invalid input(s): HCL No results found for: [facility] Lab Results  Component Value Date   TSH 4.26 12/22/2015   No results found for: HGBA1C No results found for: CHOL, HDL, LDLCALC, LDLDIRECT, TRIG, CHOLHDL  Significant Diagnostic Results in last 30 days:  No results found.  Assessment and Plan  Depression Pt continues to do well on remeron 15 mg qhs;plan to cont current meds  Dementia without  behavioral disturbance Pt is doing well; able to do for herself in her room, out of room with activities;plan to cont supportive care  PVD (peripheral vascular disease) ([place]) No reported wounds or breakdowns;plan to cont ASA 325 mg daily    Torrence Hammack D. Sheppard Coil, MD

## 2016-09-20 DIAGNOSIS — I4891 Unspecified atrial fibrillation: Secondary | ICD-10-CM | POA: Diagnosis not present

## 2016-09-20 DIAGNOSIS — E1151 Type 2 diabetes mellitus with diabetic peripheral angiopathy without gangrene: Secondary | ICD-10-CM | POA: Diagnosis not present

## 2016-09-20 DIAGNOSIS — I509 Heart failure, unspecified: Secondary | ICD-10-CM | POA: Diagnosis not present

## 2016-09-20 DIAGNOSIS — E785 Hyperlipidemia, unspecified: Secondary | ICD-10-CM | POA: Diagnosis not present

## 2016-09-20 DIAGNOSIS — I10 Essential (primary) hypertension: Secondary | ICD-10-CM | POA: Diagnosis not present

## 2016-09-20 DIAGNOSIS — I739 Peripheral vascular disease, unspecified: Secondary | ICD-10-CM | POA: Diagnosis not present

## 2016-09-20 DIAGNOSIS — I519 Heart disease, unspecified: Secondary | ICD-10-CM | POA: Diagnosis not present

## 2016-09-20 DIAGNOSIS — R609 Edema, unspecified: Secondary | ICD-10-CM | POA: Diagnosis not present

## 2016-09-20 DIAGNOSIS — E039 Hypothyroidism, unspecified: Secondary | ICD-10-CM | POA: Diagnosis not present

## 2016-09-20 LAB — TSH: TSH: 8.01 u[IU]/mL — AB (ref 0.41–5.90)

## 2016-09-20 LAB — CBC AND DIFFERENTIAL
HCT: 42 % (ref 36–46)
Hemoglobin: 13.7 g/dL (ref 12.0–16.0)
Platelets: 143 10*3/uL — AB (ref 150–399)
WBC: 4.7 10*3/mL

## 2016-09-20 LAB — LIPID PANEL
Cholesterol: 149 mg/dL (ref 0–200)
HDL: 68 mg/dL (ref 35–70)
LDL Cholesterol: 69 mg/dL
Triglycerides: 58 mg/dL (ref 40–160)

## 2016-09-20 LAB — HEPATIC FUNCTION PANEL
ALK PHOS: 77 U/L (ref 25–125)
ALT: 8 U/L (ref 7–35)
AST: 21 U/L (ref 13–35)
Bilirubin, Total: 1 mg/dL

## 2016-09-20 LAB — BASIC METABOLIC PANEL
BUN: 20 mg/dL (ref 4–21)
CREATININE: 0.9 mg/dL (ref 0.5–1.1)
GLUCOSE: 122 mg/dL
POTASSIUM: 4 mmol/L (ref 3.4–5.3)
Sodium: 141 mmol/L (ref 137–147)

## 2016-09-20 LAB — HEMOGLOBIN A1C: Hemoglobin A1C: 5.1

## 2016-09-21 DIAGNOSIS — R609 Edema, unspecified: Secondary | ICD-10-CM | POA: Diagnosis not present

## 2016-09-21 DIAGNOSIS — I739 Peripheral vascular disease, unspecified: Secondary | ICD-10-CM | POA: Diagnosis not present

## 2016-09-21 DIAGNOSIS — I4891 Unspecified atrial fibrillation: Secondary | ICD-10-CM | POA: Diagnosis not present

## 2016-09-21 DIAGNOSIS — I509 Heart failure, unspecified: Secondary | ICD-10-CM | POA: Diagnosis not present

## 2016-09-21 DIAGNOSIS — I519 Heart disease, unspecified: Secondary | ICD-10-CM | POA: Diagnosis not present

## 2016-09-21 DIAGNOSIS — I10 Essential (primary) hypertension: Secondary | ICD-10-CM | POA: Diagnosis not present

## 2016-09-23 ENCOUNTER — Encounter: Payer: Self-pay | Admitting: Internal Medicine

## 2016-09-23 NOTE — Assessment & Plan Note (Signed)
No reported wounds or breakdowns;plan to cont ASA 325 mg daily

## 2016-09-23 NOTE — Assessment & Plan Note (Signed)
Pt continues to do well on remeron 15 mg qhs;plan to cont current meds

## 2016-09-23 NOTE — Assessment & Plan Note (Signed)
Pt is doing well; able to do for herself in her room, out of room with activities;plan to cont supportive care

## 2016-09-24 DIAGNOSIS — I4891 Unspecified atrial fibrillation: Secondary | ICD-10-CM | POA: Diagnosis not present

## 2016-09-24 DIAGNOSIS — I519 Heart disease, unspecified: Secondary | ICD-10-CM | POA: Diagnosis not present

## 2016-09-24 DIAGNOSIS — I10 Essential (primary) hypertension: Secondary | ICD-10-CM | POA: Diagnosis not present

## 2016-09-24 DIAGNOSIS — R609 Edema, unspecified: Secondary | ICD-10-CM | POA: Diagnosis not present

## 2016-09-24 DIAGNOSIS — I739 Peripheral vascular disease, unspecified: Secondary | ICD-10-CM | POA: Diagnosis not present

## 2016-09-24 DIAGNOSIS — I509 Heart failure, unspecified: Secondary | ICD-10-CM | POA: Diagnosis not present

## 2016-09-26 DIAGNOSIS — I4891 Unspecified atrial fibrillation: Secondary | ICD-10-CM | POA: Diagnosis not present

## 2016-09-26 DIAGNOSIS — I739 Peripheral vascular disease, unspecified: Secondary | ICD-10-CM | POA: Diagnosis not present

## 2016-09-26 DIAGNOSIS — I509 Heart failure, unspecified: Secondary | ICD-10-CM | POA: Diagnosis not present

## 2016-09-26 DIAGNOSIS — I10 Essential (primary) hypertension: Secondary | ICD-10-CM | POA: Diagnosis not present

## 2016-09-26 DIAGNOSIS — R609 Edema, unspecified: Secondary | ICD-10-CM | POA: Diagnosis not present

## 2016-09-26 DIAGNOSIS — I519 Heart disease, unspecified: Secondary | ICD-10-CM | POA: Diagnosis not present

## 2016-09-28 DIAGNOSIS — R609 Edema, unspecified: Secondary | ICD-10-CM | POA: Diagnosis not present

## 2016-09-28 DIAGNOSIS — I739 Peripheral vascular disease, unspecified: Secondary | ICD-10-CM | POA: Diagnosis not present

## 2016-09-28 DIAGNOSIS — I10 Essential (primary) hypertension: Secondary | ICD-10-CM | POA: Diagnosis not present

## 2016-09-28 DIAGNOSIS — I4891 Unspecified atrial fibrillation: Secondary | ICD-10-CM | POA: Diagnosis not present

## 2016-09-28 DIAGNOSIS — I509 Heart failure, unspecified: Secondary | ICD-10-CM | POA: Diagnosis not present

## 2016-09-28 DIAGNOSIS — I519 Heart disease, unspecified: Secondary | ICD-10-CM | POA: Diagnosis not present

## 2016-09-30 DIAGNOSIS — I509 Heart failure, unspecified: Secondary | ICD-10-CM | POA: Diagnosis not present

## 2016-09-30 DIAGNOSIS — I4891 Unspecified atrial fibrillation: Secondary | ICD-10-CM | POA: Diagnosis not present

## 2016-09-30 DIAGNOSIS — I10 Essential (primary) hypertension: Secondary | ICD-10-CM | POA: Diagnosis not present

## 2016-09-30 DIAGNOSIS — R609 Edema, unspecified: Secondary | ICD-10-CM | POA: Diagnosis not present

## 2016-09-30 DIAGNOSIS — I739 Peripheral vascular disease, unspecified: Secondary | ICD-10-CM | POA: Diagnosis not present

## 2016-09-30 DIAGNOSIS — I519 Heart disease, unspecified: Secondary | ICD-10-CM | POA: Diagnosis not present

## 2016-10-02 DIAGNOSIS — I519 Heart disease, unspecified: Secondary | ICD-10-CM | POA: Diagnosis not present

## 2016-10-02 DIAGNOSIS — I10 Essential (primary) hypertension: Secondary | ICD-10-CM | POA: Diagnosis not present

## 2016-10-02 DIAGNOSIS — R609 Edema, unspecified: Secondary | ICD-10-CM | POA: Diagnosis not present

## 2016-10-02 DIAGNOSIS — I739 Peripheral vascular disease, unspecified: Secondary | ICD-10-CM | POA: Diagnosis not present

## 2016-10-02 DIAGNOSIS — I4891 Unspecified atrial fibrillation: Secondary | ICD-10-CM | POA: Diagnosis not present

## 2016-10-02 DIAGNOSIS — I509 Heart failure, unspecified: Secondary | ICD-10-CM | POA: Diagnosis not present

## 2016-10-04 DIAGNOSIS — R609 Edema, unspecified: Secondary | ICD-10-CM | POA: Diagnosis not present

## 2016-10-04 DIAGNOSIS — B351 Tinea unguium: Secondary | ICD-10-CM | POA: Diagnosis not present

## 2016-10-04 DIAGNOSIS — I509 Heart failure, unspecified: Secondary | ICD-10-CM | POA: Diagnosis not present

## 2016-10-04 DIAGNOSIS — M79674 Pain in right toe(s): Secondary | ICD-10-CM | POA: Diagnosis not present

## 2016-10-04 DIAGNOSIS — I70203 Unspecified atherosclerosis of native arteries of extremities, bilateral legs: Secondary | ICD-10-CM | POA: Diagnosis not present

## 2016-10-04 DIAGNOSIS — I739 Peripheral vascular disease, unspecified: Secondary | ICD-10-CM | POA: Diagnosis not present

## 2016-10-04 DIAGNOSIS — I519 Heart disease, unspecified: Secondary | ICD-10-CM | POA: Diagnosis not present

## 2016-10-04 DIAGNOSIS — I10 Essential (primary) hypertension: Secondary | ICD-10-CM | POA: Diagnosis not present

## 2016-10-04 DIAGNOSIS — I4891 Unspecified atrial fibrillation: Secondary | ICD-10-CM | POA: Diagnosis not present

## 2016-10-05 ENCOUNTER — Other Ambulatory Visit: Payer: Self-pay

## 2016-10-05 DIAGNOSIS — I10 Essential (primary) hypertension: Secondary | ICD-10-CM | POA: Diagnosis not present

## 2016-10-05 DIAGNOSIS — I739 Peripheral vascular disease, unspecified: Secondary | ICD-10-CM | POA: Diagnosis not present

## 2016-10-05 DIAGNOSIS — I509 Heart failure, unspecified: Secondary | ICD-10-CM | POA: Diagnosis not present

## 2016-10-05 DIAGNOSIS — I4891 Unspecified atrial fibrillation: Secondary | ICD-10-CM | POA: Diagnosis not present

## 2016-10-05 DIAGNOSIS — I519 Heart disease, unspecified: Secondary | ICD-10-CM | POA: Diagnosis not present

## 2016-10-05 DIAGNOSIS — R609 Edema, unspecified: Secondary | ICD-10-CM | POA: Diagnosis not present

## 2016-10-05 MED ORDER — TRAMADOL HCL 50 MG PO TABS: 50.0000 mg | ORAL_TABLET | Freq: Every day | ORAL | 5 refills | Status: AC

## 2016-10-05 NOTE — Telephone Encounter (Signed)
Faxed to [facility] Fax Number: [phone], Phone Number [phone]  

## 2016-10-07 DIAGNOSIS — R609 Edema, unspecified: Secondary | ICD-10-CM | POA: Diagnosis not present

## 2016-10-07 DIAGNOSIS — I739 Peripheral vascular disease, unspecified: Secondary | ICD-10-CM | POA: Diagnosis not present

## 2016-10-07 DIAGNOSIS — I4891 Unspecified atrial fibrillation: Secondary | ICD-10-CM | POA: Diagnosis not present

## 2016-10-07 DIAGNOSIS — I509 Heart failure, unspecified: Secondary | ICD-10-CM | POA: Diagnosis not present

## 2016-10-07 DIAGNOSIS — I10 Essential (primary) hypertension: Secondary | ICD-10-CM | POA: Diagnosis not present

## 2016-10-07 DIAGNOSIS — I519 Heart disease, unspecified: Secondary | ICD-10-CM | POA: Diagnosis not present

## 2016-10-08 DIAGNOSIS — I4891 Unspecified atrial fibrillation: Secondary | ICD-10-CM | POA: Diagnosis not present

## 2016-10-08 DIAGNOSIS — I10 Essential (primary) hypertension: Secondary | ICD-10-CM | POA: Diagnosis not present

## 2016-10-08 DIAGNOSIS — I509 Heart failure, unspecified: Secondary | ICD-10-CM | POA: Diagnosis not present

## 2016-10-08 DIAGNOSIS — I739 Peripheral vascular disease, unspecified: Secondary | ICD-10-CM | POA: Diagnosis not present

## 2016-10-08 DIAGNOSIS — R609 Edema, unspecified: Secondary | ICD-10-CM | POA: Diagnosis not present

## 2016-10-08 DIAGNOSIS — I519 Heart disease, unspecified: Secondary | ICD-10-CM | POA: Diagnosis not present

## 2016-10-09 DIAGNOSIS — I10 Essential (primary) hypertension: Secondary | ICD-10-CM | POA: Diagnosis not present

## 2016-10-09 DIAGNOSIS — R609 Edema, unspecified: Secondary | ICD-10-CM | POA: Diagnosis not present

## 2016-10-09 DIAGNOSIS — I4891 Unspecified atrial fibrillation: Secondary | ICD-10-CM | POA: Diagnosis not present

## 2016-10-09 DIAGNOSIS — I519 Heart disease, unspecified: Secondary | ICD-10-CM | POA: Diagnosis not present

## 2016-10-09 DIAGNOSIS — I509 Heart failure, unspecified: Secondary | ICD-10-CM | POA: Diagnosis not present

## 2016-10-09 DIAGNOSIS — I739 Peripheral vascular disease, unspecified: Secondary | ICD-10-CM | POA: Diagnosis not present

## 2016-10-10 DIAGNOSIS — R609 Edema, unspecified: Secondary | ICD-10-CM | POA: Diagnosis not present

## 2016-10-10 DIAGNOSIS — I739 Peripheral vascular disease, unspecified: Secondary | ICD-10-CM | POA: Diagnosis not present

## 2016-10-10 DIAGNOSIS — I4891 Unspecified atrial fibrillation: Secondary | ICD-10-CM | POA: Diagnosis not present

## 2016-10-10 DIAGNOSIS — I10 Essential (primary) hypertension: Secondary | ICD-10-CM | POA: Diagnosis not present

## 2016-10-10 DIAGNOSIS — I509 Heart failure, unspecified: Secondary | ICD-10-CM | POA: Diagnosis not present

## 2016-10-10 DIAGNOSIS — I519 Heart disease, unspecified: Secondary | ICD-10-CM | POA: Diagnosis not present

## 2016-10-12 DIAGNOSIS — I10 Essential (primary) hypertension: Secondary | ICD-10-CM | POA: Diagnosis not present

## 2016-10-12 DIAGNOSIS — I739 Peripheral vascular disease, unspecified: Secondary | ICD-10-CM | POA: Diagnosis not present

## 2016-10-12 DIAGNOSIS — I519 Heart disease, unspecified: Secondary | ICD-10-CM | POA: Diagnosis not present

## 2016-10-12 DIAGNOSIS — I4891 Unspecified atrial fibrillation: Secondary | ICD-10-CM | POA: Diagnosis not present

## 2016-10-12 DIAGNOSIS — R609 Edema, unspecified: Secondary | ICD-10-CM | POA: Diagnosis not present

## 2016-10-12 DIAGNOSIS — I509 Heart failure, unspecified: Secondary | ICD-10-CM | POA: Diagnosis not present

## 2016-10-15 DIAGNOSIS — I739 Peripheral vascular disease, unspecified: Secondary | ICD-10-CM | POA: Diagnosis not present

## 2016-10-15 DIAGNOSIS — I4891 Unspecified atrial fibrillation: Secondary | ICD-10-CM | POA: Diagnosis not present

## 2016-10-15 DIAGNOSIS — I10 Essential (primary) hypertension: Secondary | ICD-10-CM | POA: Diagnosis not present

## 2016-10-15 DIAGNOSIS — I509 Heart failure, unspecified: Secondary | ICD-10-CM | POA: Diagnosis not present

## 2016-10-15 DIAGNOSIS — I519 Heart disease, unspecified: Secondary | ICD-10-CM | POA: Diagnosis not present

## 2016-10-15 DIAGNOSIS — R609 Edema, unspecified: Secondary | ICD-10-CM | POA: Diagnosis not present

## 2016-10-16 DIAGNOSIS — I509 Heart failure, unspecified: Secondary | ICD-10-CM | POA: Diagnosis not present

## 2016-10-16 DIAGNOSIS — I10 Essential (primary) hypertension: Secondary | ICD-10-CM | POA: Diagnosis not present

## 2016-10-16 DIAGNOSIS — I739 Peripheral vascular disease, unspecified: Secondary | ICD-10-CM | POA: Diagnosis not present

## 2016-10-16 DIAGNOSIS — I4891 Unspecified atrial fibrillation: Secondary | ICD-10-CM | POA: Diagnosis not present

## 2016-10-16 DIAGNOSIS — I519 Heart disease, unspecified: Secondary | ICD-10-CM | POA: Diagnosis not present

## 2016-10-16 DIAGNOSIS — R609 Edema, unspecified: Secondary | ICD-10-CM | POA: Diagnosis not present

## 2016-10-17 DIAGNOSIS — I739 Peripheral vascular disease, unspecified: Secondary | ICD-10-CM | POA: Diagnosis not present

## 2016-10-17 DIAGNOSIS — R609 Edema, unspecified: Secondary | ICD-10-CM | POA: Diagnosis not present

## 2016-10-17 DIAGNOSIS — I4891 Unspecified atrial fibrillation: Secondary | ICD-10-CM | POA: Diagnosis not present

## 2016-10-17 DIAGNOSIS — I509 Heart failure, unspecified: Secondary | ICD-10-CM | POA: Diagnosis not present

## 2016-10-17 DIAGNOSIS — I10 Essential (primary) hypertension: Secondary | ICD-10-CM | POA: Diagnosis not present

## 2016-10-17 DIAGNOSIS — I519 Heart disease, unspecified: Secondary | ICD-10-CM | POA: Diagnosis not present

## 2016-10-18 DIAGNOSIS — M6281 Muscle weakness (generalized): Secondary | ICD-10-CM | POA: Diagnosis not present

## 2016-10-18 DIAGNOSIS — J449 Chronic obstructive pulmonary disease, unspecified: Secondary | ICD-10-CM | POA: Diagnosis not present

## 2016-10-18 DIAGNOSIS — R2681 Unsteadiness on feet: Secondary | ICD-10-CM | POA: Diagnosis not present

## 2016-10-19 DIAGNOSIS — J449 Chronic obstructive pulmonary disease, unspecified: Secondary | ICD-10-CM | POA: Diagnosis not present

## 2016-10-19 DIAGNOSIS — R2681 Unsteadiness on feet: Secondary | ICD-10-CM | POA: Diagnosis not present

## 2016-10-19 DIAGNOSIS — M6281 Muscle weakness (generalized): Secondary | ICD-10-CM | POA: Diagnosis not present

## 2016-10-20 DIAGNOSIS — R2681 Unsteadiness on feet: Secondary | ICD-10-CM | POA: Diagnosis not present

## 2016-10-20 DIAGNOSIS — M6281 Muscle weakness (generalized): Secondary | ICD-10-CM | POA: Diagnosis not present

## 2016-10-20 DIAGNOSIS — J449 Chronic obstructive pulmonary disease, unspecified: Secondary | ICD-10-CM | POA: Diagnosis not present

## 2016-10-21 DIAGNOSIS — M6281 Muscle weakness (generalized): Secondary | ICD-10-CM | POA: Diagnosis not present

## 2016-10-21 DIAGNOSIS — J449 Chronic obstructive pulmonary disease, unspecified: Secondary | ICD-10-CM | POA: Diagnosis not present

## 2016-10-21 DIAGNOSIS — R2681 Unsteadiness on feet: Secondary | ICD-10-CM | POA: Diagnosis not present

## 2016-10-22 ENCOUNTER — Encounter: Payer: Self-pay | Admitting: Internal Medicine

## 2016-10-22 ENCOUNTER — Non-Acute Institutional Stay (SKILLED_NURSING_FACILITY): Payer: Medicare Other | Admitting: Internal Medicine

## 2016-10-22 DIAGNOSIS — I5032 Chronic diastolic (congestive) heart failure: Secondary | ICD-10-CM | POA: Diagnosis not present

## 2016-10-22 DIAGNOSIS — I4891 Unspecified atrial fibrillation: Secondary | ICD-10-CM

## 2016-10-22 DIAGNOSIS — I110 Hypertensive heart disease with heart failure: Secondary | ICD-10-CM | POA: Diagnosis not present

## 2016-10-22 DIAGNOSIS — R2681 Unsteadiness on feet: Secondary | ICD-10-CM | POA: Diagnosis not present

## 2016-10-22 DIAGNOSIS — M6281 Muscle weakness (generalized): Secondary | ICD-10-CM | POA: Diagnosis not present

## 2016-10-22 DIAGNOSIS — J449 Chronic obstructive pulmonary disease, unspecified: Secondary | ICD-10-CM | POA: Diagnosis not present

## 2016-10-22 NOTE — Progress Notes (Signed)
2Location:  [facility] Room Number: 213W Place of Service:  SNF [phone])  Mariah Homes, MD  Patient Care Team: [name], MD as PCP - General (Internal Medicine)  Extended Emergency Contact Information Primary Emergency Contact: [name],Mariah Address: [address]          [place], [place] [address] [name] of [place] Phone: [phone] Relation: Daughter    Allergies: Lyrica [pregabalin] and Penicillins  Chief Complaint  Patient presents with  . Medical Management of Chronic Issues    Routine Visit    HPI: Patient is 82 y.o. female who is being seen for routine issues of AF, HTN and CHF. Pt has been doing so well that she is no longer eligable to be followed by Hospice.  Past Medical History:  Diagnosis Date  . Atrial fibrillation ([place])   . CHF (congestive heart failure) ([place])   . COPD (chronic obstructive pulmonary disease) ([place])   . Glaucoma   . Hypertension     Past Surgical History:  Procedure Laterality Date  . FEMUR IM NAIL Left 09/09/2015   Procedure: INTRAMEDULLARY (IM) NAIL FEMORAL;  Surgeon: Rod Can, MD;  Location: [facility];  Service: Orthopedics;  Laterality: Left;  . HERNIA REPAIR      Current Outpatient Prescriptions on File Prior to Visit  Medication Sig Dispense Refill  . acetaminophen (TYLENOL) 325 MG tablet Take 650 mg by mouth every 4 (four) hours as needed for moderate pain.     Marland Kitchen aspirin 325 MG EC tablet Take 325 mg by mouth daily.    . bisacodyl (DULCOLAX) 5 MG EC tablet Take 10 mg by mouth daily as needed for moderate constipation. Reported on 02/14/2016    . brimonidine (ALPHAGAN P) 0.1 % SOLN Place 1 drop into both eyes 2 (two) times daily.    . Cholecalciferol (VITAMIN D3) 5000 UNITS CAPS Take 5,000 Units by mouth daily.     Marland Kitchen ENSURE PLUS (ENSURE PLUS) LIQD Take 1 Can by mouth 2 (two) times daily between meals. Offer at bedtime and in between meals    . furosemide (LASIX) 20 MG tablet Take  20 mg by mouth daily. Take 1 tab with 40 mg to equal 60 mg by mouth daily    . furosemide (LASIX) 40 MG tablet Take 40 mg by mouth daily. Take 1 tab with 20 mg to equal 60 mg by mouth daily    . gabapentin (NEURONTIN) 300 MG capsule Take 300 mg by mouth at bedtime.    Marland Kitchen guaiFENesin (MUCINEX) 600 MG 12 hr tablet Take 600 mg by mouth 2 (two) times daily.    Marland Kitchen HYDROcodone-acetaminophen (NORCO/VICODIN) 5-325 MG tablet Take 1 tablet by mouth every 4 (four) hours as needed for moderate pain or severe pain. No more than 3 grams of Tylenol in 24 hours    . latanoprost (XALATAN) 0.005 % ophthalmic solution Place 1 drop into both eyes at bedtime.    Marland Kitchen levothyroxine (SYNTHROID, LEVOTHROID) 25 MCG tablet Take 25 mcg by mouth daily before breakfast.    . Melatonin 1 MG TABS Take 1 mg by mouth at bedtime.     . metoprolol succinate (TOPROL-XL) 50 MG 24 hr tablet Take 50 mg by mouth daily. Take with or immediately following a meal.    . mirtazapine (REMERON) 30 MG tablet Take 30 mg by mouth at bedtime.    . Multiple Vitamins-Minerals (ICAPS AREDS 2) CAPS Take 1 capsule by mouth 2 (two) times daily.    Marland Kitchen  pantoprazole (PROTONIX) 40 MG tablet Take 40 mg by mouth daily.    . polyethylene glycol (MIRALAX / GLYCOLAX) packet Take 17 g by mouth every other day.    . potassium chloride SA (K-DUR,KLOR-CON) 20 MEQ tablet Take 20 mEq by mouth daily.    Marland Kitchen spironolactone (ALDACTONE) 25 MG tablet Take 12.5 mg by mouth daily.    . traMADol (ULTRAM) 50 MG tablet Take 1 tablet (50 mg total) by mouth at bedtime. 30 tablet 5   No current facility-administered medications on file prior to visit.     No orders of the defined types were placed in this encounter.   Immunization History  Administered Date(s) Administered  . Influenza, High Dose Seasonal PF 08/19/2014  . Influenza-Unspecified 08/25/2015, 08/16/2016  . PPD Test 10/31/2015, 11/17/2015  . Pneumococcal Conjugate-13 02/04/2015  . Pneumococcal-Unspecified 01/17/2009   . Tdap 07/20/2013    Social History  Substance Use Topics  . Smoking status: Never Smoker  . Smokeless tobacco: Never Used  . Alcohol use No    Review of Systems  DATA OBTAINED: from patient, nurse GENERAL:  no fevers, fatigue, appetite changes SKIN: No itching, rash HEENT: No complaint RESPIRATORY: No cough, wheezing, SOB CARDIAC: No chest pain, palpitations, lower extremity edema  GI: No abdominal pain, No N/V/D or constipation, No heartburn or reflux  GU: No dysuria, frequency or urgency, or incontinence  MUSCULOSKELETAL: No unrelieved bone/joint pain NEUROLOGIC: No headache, dizziness  PSYCHIATRIC: No overt anxiety or sadness  Vitals:   10/22/16 0847  BP: 129/70  Pulse: 88  Resp: 18  Temp: 97.3 F (36.3 C)   Body mass index is 21.99 kg/m. Physical Exam  GENERAL APPEARANCE: Alert, conversant, No acute distress  SKIN: No diaphoresis rash HEENT: Unremarkable RESPIRATORY: Breathing is even, unlabored. Lung sounds are clear   CARDIOVASCULAR: Heart RRR no murmurs, rubs or gallops. trace peripheral edema  GASTROINTESTINAL: Abdomen is soft, non-tender, not distended w/ normal bowel sounds.  GENITOURINARY: Bladder non tender, not distended  MUSCULOSKELETAL: No abnormal joints or musculature NEUROLOGIC: Cranial nerves 2-12 grossly intact. Moves all extremities PSYCHIATRIC: Mood and affect appropriate to situation, no behavioral issues  Patient Active Problem List   Diagnosis Date Noted  . Depression 07/05/2016  . GERD (gastroesophageal reflux disease) 06/10/2016  . Cerumen debris on tympanic membrane 03/14/2016  . PVD (peripheral vascular disease) ([place]) 01/24/2016  . Dementia without behavioral disturbance 01/01/2016  . Encounter for family conference with patient present 01/01/2016  . Aspiration of food 12/17/2015  . Speech abnormality 11/29/2015  . Senile purpura ([place]) 11/29/2015  . Hip pain, acute 11/06/2015  . Acute encephalopathy 11/06/2015  . Bilateral  lower extremity edema 11/06/2015  . UTI (urinary tract infection) 11/06/2015  . Chronic respiratory failure with hypoxia ([place]) 11/06/2015  . Closed left hip fracture ([place]) 09/09/2015  . Chronic diastolic CHF (congestive heart failure) ([place]) 09/09/2015  . COPD (chronic obstructive pulmonary disease) ([place])   . Atrial fibrillation ([place])   . Hypertensive heart disease with CHF (congestive heart failure) ([place])   . Chronic bronchitis ([place])   . Essential hypertension   . Fall   . Closed pertrochanteric fracture ([facility])     CMP     Component Value Date/Time   NA 141 09/20/2016   K 4.0 09/20/2016   CL 107 09/13/2015 0448   CO2 24 09/13/2015 0448   GLUCOSE 101 (H) 09/13/2015 0448   BUN 20 09/20/2016   CREATININE 0.9 09/20/2016   CREATININE 0.66 09/13/2015 0448   CALCIUM 8.7 (  L) 09/13/2015 0448   PROT 6.9 09/08/2015 2325   ALBUMIN 4.1 09/08/2015 2325   AST 21 09/20/2016   ALT 8 09/20/2016   ALKPHOS 77 09/20/2016   BILITOT 1.3 (H) 09/08/2015 2325   GFRNONAA >60 09/13/2015 0448   GFRAA >60 09/13/2015 0448    Recent Labs  01/16/16 09/20/16  NA 141 141  K 3.9 4.0  BUN 11 20  CREATININE 0.6 0.9    Recent Labs  09/20/16  AST 21  ALT 8  ALKPHOS 77    Recent Labs  12/22/15 09/20/16  WBC 4.0 4.7  HGB 12.9 13.7  HCT 40 42  PLT 127* 143*    Recent Labs  09/20/16  CHOL 149  LDLCALC 69  TRIG 58   No results found for: [facility] Lab Results  Component Value Date   TSH 8.01 (A) 09/20/2016   Lab Results  Component Value Date   HGBA1C 5.1 09/20/2016   Lab Results  Component Value Date   CHOL 149 09/20/2016   HDL 68 09/20/2016   LDLCALC 69 09/20/2016   TRIG 58 09/20/2016    Significant Diagnostic Results in last 30 days:  No results found.  Assessment and Plan  Atrial fibrillation ([place]) Stable;plan to cont ASA 325 mg as prophylaxis and metoprolol XL50 mg daily for rate  Hypertensive heart disease with CHF (congestive heart failure) ([facility]) Controlled ; plan  to cont toprol XL 50 mg daily, aldactone 12.5  mg daily and lasix 60 mg daily   Chronic diastolic CHF (congestive heart failure) ([facility]) No reported exacerbations;plan to cont lasix 60 mg daily aldactone 12.5 mg BID and toprol XL 50 mg daily     Taytem Ghattas D. Sheppard Coil, MD

## 2016-10-23 DIAGNOSIS — M6281 Muscle weakness (generalized): Secondary | ICD-10-CM | POA: Diagnosis not present

## 2016-10-23 DIAGNOSIS — J449 Chronic obstructive pulmonary disease, unspecified: Secondary | ICD-10-CM | POA: Diagnosis not present

## 2016-10-23 DIAGNOSIS — R2681 Unsteadiness on feet: Secondary | ICD-10-CM | POA: Diagnosis not present

## 2016-10-24 DIAGNOSIS — J449 Chronic obstructive pulmonary disease, unspecified: Secondary | ICD-10-CM | POA: Diagnosis not present

## 2016-10-24 DIAGNOSIS — M6281 Muscle weakness (generalized): Secondary | ICD-10-CM | POA: Diagnosis not present

## 2016-10-24 DIAGNOSIS — R2681 Unsteadiness on feet: Secondary | ICD-10-CM | POA: Diagnosis not present

## 2016-10-25 DIAGNOSIS — J449 Chronic obstructive pulmonary disease, unspecified: Secondary | ICD-10-CM | POA: Diagnosis not present

## 2016-10-25 DIAGNOSIS — M6281 Muscle weakness (generalized): Secondary | ICD-10-CM | POA: Diagnosis not present

## 2016-10-25 DIAGNOSIS — R2681 Unsteadiness on feet: Secondary | ICD-10-CM | POA: Diagnosis not present

## 2016-10-26 DIAGNOSIS — J449 Chronic obstructive pulmonary disease, unspecified: Secondary | ICD-10-CM | POA: Diagnosis not present

## 2016-10-26 DIAGNOSIS — M6281 Muscle weakness (generalized): Secondary | ICD-10-CM | POA: Diagnosis not present

## 2016-10-26 DIAGNOSIS — R2681 Unsteadiness on feet: Secondary | ICD-10-CM | POA: Diagnosis not present

## 2016-10-27 DIAGNOSIS — M6281 Muscle weakness (generalized): Secondary | ICD-10-CM | POA: Diagnosis not present

## 2016-10-27 DIAGNOSIS — J449 Chronic obstructive pulmonary disease, unspecified: Secondary | ICD-10-CM | POA: Diagnosis not present

## 2016-10-27 DIAGNOSIS — R2681 Unsteadiness on feet: Secondary | ICD-10-CM | POA: Diagnosis not present

## 2016-10-29 DIAGNOSIS — R2681 Unsteadiness on feet: Secondary | ICD-10-CM | POA: Diagnosis not present

## 2016-10-29 DIAGNOSIS — M6281 Muscle weakness (generalized): Secondary | ICD-10-CM | POA: Diagnosis not present

## 2016-10-29 DIAGNOSIS — J449 Chronic obstructive pulmonary disease, unspecified: Secondary | ICD-10-CM | POA: Diagnosis not present

## 2016-10-30 DIAGNOSIS — R2681 Unsteadiness on feet: Secondary | ICD-10-CM | POA: Diagnosis not present

## 2016-10-30 DIAGNOSIS — J449 Chronic obstructive pulmonary disease, unspecified: Secondary | ICD-10-CM | POA: Diagnosis not present

## 2016-10-30 DIAGNOSIS — M6281 Muscle weakness (generalized): Secondary | ICD-10-CM | POA: Diagnosis not present

## 2016-10-31 DIAGNOSIS — R2681 Unsteadiness on feet: Secondary | ICD-10-CM | POA: Diagnosis not present

## 2016-10-31 DIAGNOSIS — M6281 Muscle weakness (generalized): Secondary | ICD-10-CM | POA: Diagnosis not present

## 2016-10-31 DIAGNOSIS — J449 Chronic obstructive pulmonary disease, unspecified: Secondary | ICD-10-CM | POA: Diagnosis not present

## 2016-11-01 DIAGNOSIS — J449 Chronic obstructive pulmonary disease, unspecified: Secondary | ICD-10-CM | POA: Diagnosis not present

## 2016-11-01 DIAGNOSIS — E039 Hypothyroidism, unspecified: Secondary | ICD-10-CM | POA: Diagnosis not present

## 2016-11-01 DIAGNOSIS — M6281 Muscle weakness (generalized): Secondary | ICD-10-CM | POA: Diagnosis not present

## 2016-11-01 DIAGNOSIS — R2681 Unsteadiness on feet: Secondary | ICD-10-CM | POA: Diagnosis not present

## 2016-11-01 LAB — TSH: TSH: 5.83 u[IU]/mL (ref 0.41–5.90)

## 2016-11-02 DIAGNOSIS — M6281 Muscle weakness (generalized): Secondary | ICD-10-CM | POA: Diagnosis not present

## 2016-11-02 DIAGNOSIS — R2681 Unsteadiness on feet: Secondary | ICD-10-CM | POA: Diagnosis not present

## 2016-11-02 DIAGNOSIS — J449 Chronic obstructive pulmonary disease, unspecified: Secondary | ICD-10-CM | POA: Diagnosis not present

## 2016-11-03 ENCOUNTER — Encounter: Payer: Self-pay | Admitting: Internal Medicine

## 2016-11-03 NOTE — Assessment & Plan Note (Addendum)
Controlled ; plan to cont toprol XL 50 mg daily, aldactone 12.5  mg daily and lasix 60 mg daily

## 2016-11-03 NOTE — Assessment & Plan Note (Signed)
Stable;plan to cont ASA 325 mg as prophylaxis and metoprolol XL50 mg daily for rate

## 2016-11-03 NOTE — Assessment & Plan Note (Signed)
No reported exacerbations;plan to cont lasix 60 mg daily aldactone 12.5 mg BID and toprol XL 50 mg daily

## 2016-11-05 ENCOUNTER — Encounter: Payer: Self-pay | Admitting: Internal Medicine

## 2016-11-05 ENCOUNTER — Non-Acute Institutional Stay (SKILLED_NURSING_FACILITY): Payer: Medicare Other | Admitting: Internal Medicine

## 2016-11-05 DIAGNOSIS — S91312A Laceration without foreign body, left foot, initial encounter: Secondary | ICD-10-CM | POA: Diagnosis not present

## 2016-11-05 DIAGNOSIS — J449 Chronic obstructive pulmonary disease, unspecified: Secondary | ICD-10-CM | POA: Diagnosis not present

## 2016-11-05 DIAGNOSIS — R2681 Unsteadiness on feet: Secondary | ICD-10-CM | POA: Diagnosis not present

## 2016-11-05 DIAGNOSIS — M6281 Muscle weakness (generalized): Secondary | ICD-10-CM | POA: Diagnosis not present

## 2016-11-05 NOTE — Progress Notes (Signed)
Location:  [facility] Room Number: 213W Place of Service:  SNF [phone])  [name], MD  Patient Care Team: [name], MD as PCP - General (Internal Medicine)  Extended Emergency Contact Information Primary Emergency Contact: [name],[name] Address: [address]          [place], [place] [address] [name] of [place] Phone: [phone] Relation: Daughter    Allergies: Lyrica [pregabalin] and Penicillins  Chief Complaint  Patient presents with  . Acute Visit    Acute    HPI: Patient is 82 y.o. female who nursing asked me to see because the pt sustained a cut on her foot when she dropped a pair of scissors. Pt denies pain or much blood; she is fine.  Past Medical History:  Diagnosis Date  . Atrial fibrillation ([place])   . CHF (congestive heart failure) ([place])   . COPD (chronic obstructive pulmonary disease) ([place])   . Glaucoma   . Hypertension     Past Surgical History:  Procedure Laterality Date  . FEMUR IM NAIL Left 09/09/2015   Procedure: INTRAMEDULLARY (IM) NAIL FEMORAL;  Surgeon: Rod Can, MD;  Location: [facility];  Service: Orthopedics;  Laterality: Left;  . HERNIA REPAIR      Allergies as of 11/05/2016      Reactions   Lyrica [pregabalin] Other (See Comments)   Caused her to be lethargic and her legs felt as if she could not stand   Penicillins Rash      Medication List       Accurate as of 11/05/16 11:31 AM. Always use your most recent med list.          acetaminophen 325 MG tablet Commonly known as:  TYLENOL Take 650 mg by mouth every 4 (four) hours as needed for moderate pain.   aspirin 325 MG EC tablet Take 325 mg by mouth daily.   bisacodyl 5 MG EC tablet Commonly known as:  DULCOLAX Take 10 mg by mouth daily as needed for moderate constipation. Reported on 02/14/2016   brimonidine 0.1 % Soln Commonly known as:  ALPHAGAN P Place 1 drop into both eyes 2 (two) times daily.   ENSURE PLUS  Liqd Take 1 Can by mouth 2 (two) times daily between meals. Offer at bedtime and in between meals   gabapentin 300 MG capsule Commonly known as:  NEURONTIN Take 300 mg by mouth at bedtime.   guaiFENesin 600 MG 12 hr tablet Commonly known as:  MUCINEX Take 600 mg by mouth 2 (two) times daily.   HYDROcodone-acetaminophen 5-325 MG tablet Commonly known as:  NORCO/VICODIN Take 1 tablet by mouth every 4 (four) hours as needed for moderate pain or severe pain. No more than 3 grams of Tylenol in 24 hours   ICAPS AREDS 2 Caps Take 1 capsule by mouth 2 (two) times daily.   LASIX 40 MG tablet Generic drug:  furosemide Take 40 mg by mouth daily. Take 1 tab with 20 mg to equal 60 mg by mouth daily   furosemide 20 MG tablet Commonly known as:  LASIX Take 20 mg by mouth daily. Take 1 tab with 40 mg to equal 60 mg by mouth daily   latanoprost 0.005 % ophthalmic solution Commonly known as:  XALATAN Place 1 drop into both eyes at bedtime.   levothyroxine 50 MCG tablet Commonly known as:  SYNTHROID, LEVOTHROID Take 50 mcg by mouth daily before breakfast.   Melatonin 1 MG Tabs Take 1  mg by mouth at bedtime.   metoprolol succinate 50 MG 24 hr tablet Commonly known as:  TOPROL-XL Take 50 mg by mouth daily. Take with or immediately following a meal.   mirtazapine 30 MG tablet Commonly known as:  REMERON Take 30 mg by mouth at bedtime.   pantoprazole 40 MG tablet Commonly known as:  PROTONIX Take 40 mg by mouth daily.   polyethylene glycol packet Commonly known as:  MIRALAX / GLYCOLAX Take 17 g by mouth every other day.   potassium chloride SA 20 MEQ tablet Commonly known as:  K-DUR,KLOR-CON Take 20 mEq by mouth daily.   spironolactone 25 MG tablet Commonly known as:  ALDACTONE Take 12.5 mg by mouth daily.   traMADol 50 MG tablet Commonly known as:  ULTRAM Take 1 tablet (50 mg total) by mouth at bedtime.   Vitamin D3 5000 units Caps Take 5,000 Units by mouth daily.        Meds ordered this encounter  Medications  . levothyroxine (SYNTHROID, LEVOTHROID) 50 MCG tablet    Sig: Take 50 mcg by mouth daily before breakfast.    Immunization History  Administered Date(s) Administered  . Influenza, High Dose Seasonal PF 08/19/2014  . Influenza-Unspecified 08/25/2015, 08/16/2016  . PPD Test 10/31/2015, 11/17/2015  . Pneumococcal Conjugate-13 02/04/2015  . Pneumococcal-Unspecified 01/17/2009  . Tdap 07/20/2013    Social History  Substance Use Topics  . Smoking status: Never Smoker  . Smokeless tobacco: Never Used  . Alcohol use No    Review of Systems  DATA OBTAINED: from patient, nurse GENERAL:  no fevers, fatigue, appetite changes SKIN: small cut on top of foot HEENT: No complaint RESPIRATORY: No cough, wheezing, SOB CARDIAC: No chest pain, palpitations, lower extremity edema  GI: No abdominal pain, No N/V/D or constipation, No heartburn or reflux  GU: No dysuria, frequency or urgency, or incontinence  MUSCULOSKELETAL: No unrelieved bone/joint pain NEUROLOGIC: No headache, dizziness  PSYCHIATRIC: No overt anxiety or sadness  Vitals:   11/05/16 1120  BP: 130/71  Pulse: 92  Resp: 18  Temp: 97.9 F (36.6 C)   Body mass index is 21.94 kg/m. Physical Exam  GENERAL APPEARANCE: Alert, conversant, No acute distress  SKIN: small cut volar L foot, not deep, just a dressing needed HEENT: Unremarkable RESPIRATORY: Breathing is even, unlabored. Lung sounds are clear   CARDIOVASCULAR: Heart RRR no murmurs, rubs or gallops. No peripheral edema  GASTROINTESTINAL: Abdomen is soft, non-tender, not distended w/ normal bowel sounds.  GENITOURINARY: Bladder non tender, not distended  MUSCULOSKELETAL: No abnormal joints or musculature NEUROLOGIC: Cranial nerves 2-12 grossly intact. Moves all extremities PSYCHIATRIC: Mood and affect appropriate to situation with dementia, no behavioral issues  Patient Active Problem List   Diagnosis Date Noted  .  Depression 07/05/2016  . GERD (gastroesophageal reflux disease) 06/10/2016  . Cerumen debris on tympanic membrane 03/14/2016  . PVD (peripheral vascular disease) ([place]) 01/24/2016  . Dementia without behavioral disturbance 01/01/2016  . Encounter for family conference with patient present 01/01/2016  . Aspiration of food 12/17/2015  . Speech abnormality 11/29/2015  . Senile purpura ([place]) 11/29/2015  . Hip pain, acute 11/06/2015  . Acute encephalopathy 11/06/2015  . Bilateral lower extremity edema 11/06/2015  . UTI (urinary tract infection) 11/06/2015  . Chronic respiratory failure with hypoxia ([place]) 11/06/2015  . Closed left hip fracture ([place]) 09/09/2015  . Chronic diastolic CHF (congestive heart failure) ([place]) 09/09/2015  . COPD (chronic obstructive pulmonary disease) ([place])   . Atrial fibrillation ([place])   .  Hypertensive heart disease with CHF (congestive heart failure) ([place])   . Chronic bronchitis ([place])   . Essential hypertension   . Fall   . Closed pertrochanteric fracture ([facility])     CMP     Component Value Date/Time   NA 141 09/20/2016   K 4.0 09/20/2016   CL 107 09/13/2015 0448   CO2 24 09/13/2015 0448   GLUCOSE 101 (H) 09/13/2015 0448   BUN 20 09/20/2016   CREATININE 0.9 09/20/2016   CREATININE 0.66 09/13/2015 0448   CALCIUM 8.7 (L) 09/13/2015 0448   PROT 6.9 09/08/2015 2325   ALBUMIN 4.1 09/08/2015 2325   AST 21 09/20/2016   ALT 8 09/20/2016   ALKPHOS 77 09/20/2016   BILITOT 1.3 (H) 09/08/2015 2325   GFRNONAA >60 09/13/2015 0448   GFRAA >60 09/13/2015 0448    Recent Labs  01/16/16 09/20/16  NA 141 141  K 3.9 4.0  BUN 11 20  CREATININE 0.6 0.9    Recent Labs  09/20/16  AST 21  ALT 8  ALKPHOS 77    Recent Labs  12/22/15 09/20/16  WBC 4.0 4.7  HGB 12.9 13.7  HCT 40 42  PLT 127* 143*    Recent Labs  09/20/16  CHOL 149  LDLCALC 69  TRIG 58   No results found for: [facility] Lab Results  Component Value Date   TSH 8.01 (A) 09/20/2016    Lab Results  Component Value Date   HGBA1C 5.1 09/20/2016   Lab Results  Component Value Date   CHOL 149 09/20/2016   HDL 68 09/20/2016   LDLCALC 69 09/20/2016   TRIG 58 09/20/2016    Significant Diagnostic Results in last 30 days:  No results found.  Assessment and Plan  Laceration L foot - not deep-best left to heal by itself with a dressing; pt spoke of numerous other issues    Time spent > 25 min;> 50% of time with patient was spent reviewing records, labs, tests and studies, counseling and developing plan of care  Webb Silversmith D. Sheppard Coil, MD

## 2016-11-06 DIAGNOSIS — M6281 Muscle weakness (generalized): Secondary | ICD-10-CM | POA: Diagnosis not present

## 2016-11-06 DIAGNOSIS — J449 Chronic obstructive pulmonary disease, unspecified: Secondary | ICD-10-CM | POA: Diagnosis not present

## 2016-11-06 DIAGNOSIS — R2681 Unsteadiness on feet: Secondary | ICD-10-CM | POA: Diagnosis not present

## 2016-11-18 ENCOUNTER — Encounter: Payer: Self-pay | Admitting: Internal Medicine

## 2016-11-27 ENCOUNTER — Encounter: Payer: Self-pay | Admitting: Internal Medicine

## 2016-11-27 ENCOUNTER — Non-Acute Institutional Stay (SKILLED_NURSING_FACILITY): Payer: Medicare Other | Admitting: Internal Medicine

## 2016-11-27 DIAGNOSIS — J42 Unspecified chronic bronchitis: Secondary | ICD-10-CM | POA: Diagnosis not present

## 2016-11-27 DIAGNOSIS — J9611 Chronic respiratory failure with hypoxia: Secondary | ICD-10-CM

## 2016-11-27 DIAGNOSIS — K219 Gastro-esophageal reflux disease without esophagitis: Secondary | ICD-10-CM | POA: Diagnosis not present

## 2016-11-27 NOTE — Progress Notes (Signed)
Location:  [facility] Room Number: 213W Place of Service:  SNF [phone])  Mariah Homes, MD  Patient Care Team: [name], MD as PCP - General (Internal Medicine)  Extended Emergency Contact Information Primary Emergency Contact: [name],[name] Address: [address]          [place], [place] [address] [name] of [place] Phone: [phone] Relation: Daughter    Allergies: Lyrica [pregabalin] and Penicillins  Chief Complaint  Patient presents with  . Medical Management of Chronic Issues    Routine Visit    HPI: Patient is 83 y.o. female who has recemtly been dropped by Hospice because she is doing so well who is  Being seen for routine issues of chronic resp failure, COPD, and GERD.  Past Medical History:  Diagnosis Date  . Atrial fibrillation ([place])   . CHF (congestive heart failure) ([place])   . COPD (chronic obstructive pulmonary disease) ([place])   . Glaucoma   . Hypertension     Past Surgical History:  Procedure Laterality Date  . FEMUR IM NAIL Left 09/09/2015   Procedure: INTRAMEDULLARY (IM) NAIL FEMORAL;  Surgeon: Rod Can, MD;  Location: [facility];  Service: Orthopedics;  Laterality: Left;  . HERNIA REPAIR      Allergies as of 11/27/2016      Reactions   Lyrica [pregabalin] Other (See Comments)   Caused her to be lethargic and her legs felt as if she could not stand   Penicillins Rash      Medication List       Accurate as of 11/27/16 11:59 PM. Always use your most recent med list.          acetaminophen 325 MG tablet Commonly known as:  TYLENOL Take 650 mg by mouth every 4 (four) hours as needed for moderate pain.   aspirin 325 MG EC tablet Take 325 mg by mouth daily.   brimonidine 0.1 % Soln Commonly known as:  ALPHAGAN P Place 1 drop into both eyes 2 (two) times daily.   ENSURE PLUS Liqd Take 1 Can by mouth 2 (two) times daily between meals. Offer at bedtime and in between meals   gabapentin 300 MG  capsule Commonly known as:  NEURONTIN Take 300 mg by mouth at bedtime.   guaiFENesin 600 MG 12 hr tablet Commonly known as:  MUCINEX Take 600 mg by mouth 2 (two) times daily.   HYDROcodone-acetaminophen 5-325 MG tablet Commonly known as:  NORCO/VICODIN Take 1 tablet by mouth every 4 (four) hours as needed for moderate pain or severe pain. No more than 3 grams of Tylenol in 24 hours   ICAPS AREDS 2 Caps Take 1 capsule by mouth 2 (two) times daily.   LASIX 40 MG tablet Generic drug:  furosemide Take 40 mg by mouth daily. Take 1 tab with 20 mg to equal 60 mg by mouth daily   furosemide 20 MG tablet Commonly known as:  LASIX Take 20 mg by mouth daily. Take 1 tab with 40 mg to equal 60 mg by mouth daily   latanoprost 0.005 % ophthalmic solution Commonly known as:  XALATAN Place 1 drop into both eyes at bedtime.   levothyroxine 50 MCG tablet Commonly known as:  SYNTHROID, LEVOTHROID Take 50 mcg by mouth daily before breakfast.   Melatonin 1 MG Tabs Take 1 mg by mouth at bedtime.   metoprolol succinate 50 MG 24 hr tablet Commonly known as:  TOPROL-XL Take 50 mg by mouth daily.  Take with or immediately following a meal.   mirtazapine 30 MG tablet Commonly known as:  REMERON Take 30 mg by mouth at bedtime.   pantoprazole 40 MG tablet Commonly known as:  PROTONIX Take 40 mg by mouth daily.   polyethylene glycol packet Commonly known as:  MIRALAX / GLYCOLAX Take 17 g by mouth every other day.   potassium chloride SA 20 MEQ tablet Commonly known as:  K-DUR,KLOR-CON Take 20 mEq by mouth daily.   spironolactone 25 MG tablet Commonly known as:  ALDACTONE Take 12.5 mg by mouth daily.   traMADol 50 MG tablet Commonly known as:  ULTRAM Take 1 tablet (50 mg total) by mouth at bedtime.   Vitamin D3 5000 units Caps Take 5,000 Units by mouth daily.       No orders of the defined types were placed in this encounter.   Immunization History  Administered Date(s)  Administered  . Influenza, High Dose Seasonal PF 08/19/2014  . Influenza-Unspecified 08/25/2015, 08/16/2016  . PPD Test 10/31/2015, 11/17/2015  . Pneumococcal Conjugate-13 02/04/2015  . Pneumococcal-Unspecified 01/17/2009  . Tdap 07/20/2013    Social History  Substance Use Topics  . Smoking status: Never Smoker  . Smokeless tobacco: Never Used  . Alcohol use No    Review of Systems  DATA OBTAINED: from patient, nurse GENERAL:  no fevers, fatigue, appetite changes SKIN: No itching, rash HEENT: No complaint RESPIRATORY: No cough, wheezing, SOB CARDIAC: No chest pain, palpitations, lower extremity edema  GI: No abdominal pain, No N/V/D or constipation, No heartburn or reflux  GU: No dysuria, frequency or urgency, or incontinence  MUSCULOSKELETAL: No unrelieved bone/joint pain NEUROLOGIC: No headache, dizziness  PSYCHIATRIC: No overt anxiety or sadness  Vitals:   11/27/16 1050  BP: 130/71  Pulse: 75  Resp: 18  Temp: 97.9 F (36.6 C)   Body mass index is 21.94 kg/m. Physical Exam  GENERAL APPEARANCE: Alert, conversant, No acute distress  SKIN: No diaphoresis rash HEENT: Unremarkable RESPIRATORY: Breathing is even, unlabored. Lung sounds are clear   CARDIOVASCULAR: Heart RRR no murmurs, rubs or gallops. No peripheral edema  GASTROINTESTINAL: Abdomen is soft, non-tender, not distended w/ normal bowel sounds.  GENITOURINARY: Bladder non tender, not distended  MUSCULOSKELETAL: No abnormal joints or musculature NEUROLOGIC: Cranial nerves 2-12 grossly intact. Moves all extremities PSYCHIATRIC: Mood and affect appropriate to situation with dementia, no behavioral issues  Patient Active Problem List   Diagnosis Date Noted  . Depression 07/05/2016  . GERD (gastroesophageal reflux disease) 06/10/2016  . Cerumen debris on tympanic membrane 03/14/2016  . PVD (peripheral vascular disease) ([place]) 01/24/2016  . Dementia without behavioral disturbance 01/01/2016  . Encounter for  family conference with patient present 01/01/2016  . Aspiration of food 12/17/2015  . Speech abnormality 11/29/2015  . Senile purpura ([place]) 11/29/2015  . Hip pain, acute 11/06/2015  . Acute encephalopathy 11/06/2015  . Bilateral lower extremity edema 11/06/2015  . UTI (urinary tract infection) 11/06/2015  . Chronic respiratory failure with hypoxia ([place]) 11/06/2015  . Closed left hip fracture ([place]) 09/09/2015  . Chronic diastolic CHF (congestive heart failure) ([place]) 09/09/2015  . COPD (chronic obstructive pulmonary disease) ([place])   . Atrial fibrillation ([place])   . Hypertensive heart disease with CHF (congestive heart failure) ([place])   . Chronic bronchitis ([place])   . Essential hypertension   . Fall   . Closed pertrochanteric fracture ([facility])     CMP     Component Value Date/Time   NA 141 09/20/2016  K 4.0 09/20/2016   CL 107 09/13/2015 0448   CO2 24 09/13/2015 0448   GLUCOSE 101 (H) 09/13/2015 0448   BUN 20 09/20/2016   CREATININE 0.9 09/20/2016   CREATININE 0.66 09/13/2015 0448   CALCIUM 8.7 (L) 09/13/2015 0448   PROT 6.9 09/08/2015 2325   ALBUMIN 4.1 09/08/2015 2325   AST 21 09/20/2016   ALT 8 09/20/2016   ALKPHOS 77 09/20/2016   BILITOT 1.3 (H) 09/08/2015 2325   GFRNONAA >60 09/13/2015 0448   GFRAA >60 09/13/2015 0448    Recent Labs  01/16/16 09/20/16  NA 141 141  K 3.9 4.0  BUN 11 20  CREATININE 0.6 0.9    Recent Labs  09/20/16  AST 21  ALT 8  ALKPHOS 77    Recent Labs  12/22/15 09/20/16  WBC 4.0 4.7  HGB 12.9 13.7  HCT 40 42  PLT 127* 143*    Recent Labs  09/20/16  CHOL 149  LDLCALC 69  TRIG 58   No results found for: [facility] Lab Results  Component Value Date   TSH 8.01 (A) 09/20/2016   Lab Results  Component Value Date   HGBA1C 5.1 09/20/2016   Lab Results  Component Value Date   CHOL 149 09/20/2016   HDL 68 09/20/2016   LDLCALC 69 09/20/2016   TRIG 58 09/20/2016    Significant Diagnostic Results in last 30 days:  No results  found.  Assessment and Plan  Chronic respiratory failure with hypoxia (HCC) No recent infections; pt wears O2 intermiddently while in her room; will cont to monitor  COPD (chronic obstructive pulmonary disease) (HCC) No recent flares, no wheezing; cont only resp med guifenesin 600 mg BID  GERD (gastroesophageal reflux disease) No known eisodes of reflux or aspiration; plan to conr protonix 40 mg daily    Shasha Buchbinder D. Sheppard Coil, MD

## 2016-12-02 ENCOUNTER — Encounter: Payer: Self-pay | Admitting: Internal Medicine

## 2016-12-02 NOTE — Assessment & Plan Note (Signed)
No recent infections; pt wears O2 intermiddently while in her room; will cont to monitor

## 2016-12-02 NOTE — Assessment & Plan Note (Signed)
No recent flares, no wheezing; cont only resp med guifenesin 600 mg BID

## 2016-12-02 NOTE — Assessment & Plan Note (Signed)
No known eisodes of reflux or aspiration; plan to conr protonix 40 mg daily

## 2016-12-20 ENCOUNTER — Non-Acute Institutional Stay (SKILLED_NURSING_FACILITY): Payer: Medicare Other | Admitting: Internal Medicine

## 2016-12-20 DIAGNOSIS — Z20828 Contact with and (suspected) exposure to other viral communicable diseases: Secondary | ICD-10-CM

## 2016-12-25 ENCOUNTER — Encounter: Payer: Self-pay | Admitting: Internal Medicine

## 2016-12-25 ENCOUNTER — Non-Acute Institutional Stay (SKILLED_NURSING_FACILITY): Payer: Medicare Other | Admitting: Internal Medicine

## 2016-12-25 DIAGNOSIS — I739 Peripheral vascular disease, unspecified: Secondary | ICD-10-CM

## 2016-12-25 DIAGNOSIS — F0280 Dementia in other diseases classified elsewhere without behavioral disturbance: Secondary | ICD-10-CM

## 2016-12-25 DIAGNOSIS — F329 Major depressive disorder, single episode, unspecified: Secondary | ICD-10-CM | POA: Diagnosis not present

## 2016-12-25 DIAGNOSIS — F32A Depression, unspecified: Secondary | ICD-10-CM

## 2016-12-25 DIAGNOSIS — G301 Alzheimer's disease with late onset: Secondary | ICD-10-CM

## 2016-12-25 NOTE — Progress Notes (Signed)
Location:  [facility] Room Number: 213W Place of Service:  SNF [phone])  [name], MD  Patient Care Team: [name], MD as PCP - General (Internal Medicine)  Extended Emergency Contact Information Primary Emergency Contact: [name],[name] Address: [address]          [place], [place] [address] [name] of [place] Phone: [phone] Relation: Daughter    Allergies: Lyrica [pregabalin] and Penicillins  Chief Complaint  Patient presents with  . Medical Management of Chronic Issues    Routine Visit    HPI: Patient is 83 y.o. female who is being seen for routine issues of dementia, depression and PVD.  Past Medical History:  Diagnosis Date  . Atrial fibrillation ([place])   . CHF (congestive heart failure) ([place])   . COPD (chronic obstructive pulmonary disease) ([place])   . Glaucoma   . Hypertension     Past Surgical History:  Procedure Laterality Date  . FEMUR IM NAIL Left 09/09/2015   Procedure: INTRAMEDULLARY (IM) NAIL FEMORAL;  Surgeon: Rod Can, MD;  Location: [facility];  Service: Orthopedics;  Laterality: Left;  . HERNIA REPAIR      Allergies as of 12/25/2016      Reactions   Lyrica [pregabalin] Other (See Comments)   Caused her to be lethargic and her legs felt as if she could not stand   Penicillins Rash      Medication List       Accurate as of 12/25/16 11:59 PM. Always use your most recent med list.          acetaminophen 325 MG tablet Commonly known as:  TYLENOL Take 650 mg by mouth every 4 (four) hours as needed for moderate pain.   aspirin 325 MG EC tablet Take 325 mg by mouth daily.   brimonidine 0.1 % Soln Commonly known as:  ALPHAGAN P Place 1 drop into both eyes 2 (two) times daily.   ENSURE PLUS Liqd Take 1 Can by mouth 2 (two) times daily between meals. Offer at bedtime and in between meals   gabapentin 300 MG capsule Commonly known as:  NEURONTIN Take 300 mg by mouth at bedtime.     guaiFENesin 600 MG 12 hr tablet Commonly known as:  MUCINEX Take 600 mg by mouth 2 (two) times daily.   HYDROcodone-acetaminophen 5-325 MG tablet Commonly known as:  NORCO/VICODIN Take 1 tablet by mouth every 4 (four) hours as needed for moderate pain or severe pain. No more than 3 grams of Tylenol in 24 hours   ICAPS AREDS 2 Caps Take 1 capsule by mouth 2 (two) times daily.   LASIX 40 MG tablet Generic drug:  furosemide Take 40 mg by mouth daily. Take 1 tab with 20 mg to equal 60 mg by mouth daily   furosemide 20 MG tablet Commonly known as:  LASIX Take 20 mg by mouth daily. Take 1 tab with 40 mg to equal 60 mg by mouth daily   latanoprost 0.005 % ophthalmic solution Commonly known as:  XALATAN Place 1 drop into both eyes at bedtime.   levothyroxine 50 MCG tablet Commonly known as:  SYNTHROID, LEVOTHROID Take 50 mcg by mouth daily before breakfast.   Melatonin 1 MG Tabs Take 1 mg by mouth at bedtime.   metoprolol succinate 50 MG 24 hr tablet Commonly known as:  TOPROL-XL Take 50 mg by mouth daily. Take with or immediately following a meal.   mirtazapine 30 MG tablet Commonly known  as:  REMERON Take 30 mg by mouth at bedtime.   pantoprazole 40 MG tablet Commonly known as:  PROTONIX Take 40 mg by mouth daily.   polyethylene glycol packet Commonly known as:  MIRALAX / GLYCOLAX Take 17 g by mouth every other day.   potassium chloride SA 20 MEQ tablet Commonly known as:  K-DUR,KLOR-CON Take 20 mEq by mouth daily.   spironolactone 25 MG tablet Commonly known as:  ALDACTONE Take 12.5 mg by mouth daily.   traMADol 50 MG tablet Commonly known as:  ULTRAM Take 1 tablet (50 mg total) by mouth at bedtime.   Vitamin D3 5000 units Caps Take 5,000 Units by mouth daily.       No orders of the defined types were placed in this encounter.   Immunization History  Administered Date(s) Administered  . Influenza, High Dose Seasonal PF 08/19/2014  .  Influenza-Unspecified 08/25/2015, 08/16/2016  . PPD Test 10/31/2015, 11/17/2015  . Pneumococcal Conjugate-13 02/04/2015  . Pneumococcal-Unspecified 01/17/2009  . Tdap 07/20/2013    Social History  Substance Use Topics  . Smoking status: Never Smoker  . Smokeless tobacco: Never Used  . Alcohol use No    Review of Systems  DATA OBTAINED: from patient, nurse GENERAL:  no fevers, fatigue, appetite changes SKIN: No itching, rash HEENT: No complaint RESPIRATORY: No cough, wheezing, SOB CARDIAC: No chest pain, palpitations, lower extremity edema  GI: No abdominal pain, No N/V/D or constipation, No heartburn or reflux  GU: No dysuria, frequency or urgency, or incontinence  MUSCULOSKELETAL: No unrelieved bone/joint pain NEUROLOGIC: No headache, dizziness  PSYCHIATRIC: No overt anxiety or sadness  Vitals:   12/25/16 1208  BP: 130/71  Pulse: 74  Resp: 18  Temp: 97.9 F (36.6 C)   Body mass index is 22.03 kg/m. Physical Exam  GENERAL APPEARANCE: Alert, conversant, No acute distress  SKIN: No diaphoresis rash HEENT: Unremarkable RESPIRATORY: Breathing is even, unlabored. Lung sounds are clear   CARDIOVASCULAR: Heart RRR no murmurs, rubs or gallops. No peripheral edema  GASTROINTESTINAL: Abdomen is soft, non-tender, not distended w/ normal bowel sounds.  GENITOURINARY: Bladder non tender, not distended  MUSCULOSKELETAL: No abnormal joints or musculature NEUROLOGIC: Cranial nerves 2-12 grossly intact. Moves all extremities PSYCHIATRIC: Mood and affect appropriate with dementia, no behavioral issues  Patient Active Problem List   Diagnosis Date Noted  . Depression 07/05/2016  . GERD (gastroesophageal reflux disease) 06/10/2016  . Cerumen debris on tympanic membrane 03/14/2016  . PVD (peripheral vascular disease) ([place]) 01/24/2016  . Dementia without behavioral disturbance 01/01/2016  . Encounter for family conference with patient present 01/01/2016  . Aspiration of food  12/17/2015  . Speech abnormality 11/29/2015  . Senile purpura ([place]) 11/29/2015  . Hip pain, acute 11/06/2015  . Acute encephalopathy 11/06/2015  . Bilateral lower extremity edema 11/06/2015  . UTI (urinary tract infection) 11/06/2015  . Chronic respiratory failure with hypoxia ([place]) 11/06/2015  . Closed left hip fracture ([place]) 09/09/2015  . Chronic diastolic CHF (congestive heart failure) ([place]) 09/09/2015  . COPD (chronic obstructive pulmonary disease) ([place])   . Atrial fibrillation ([place])   . Hypertensive heart disease with CHF (congestive heart failure) ([place])   . Chronic bronchitis ([place])   . Essential hypertension   . Fall   . Closed pertrochanteric fracture ([facility])     CMP     Component Value Date/Time   NA 141 09/20/2016   K 4.0 09/20/2016   CL 107 09/13/2015 0448   CO2 24 09/13/2015 0448  GLUCOSE 101 (H) 09/13/2015 0448   BUN 20 09/20/2016   CREATININE 0.9 09/20/2016   CREATININE 0.66 09/13/2015 0448   CALCIUM 8.7 (L) 09/13/2015 0448   PROT 6.9 09/08/2015 2325   ALBUMIN 4.1 09/08/2015 2325   AST 21 09/20/2016   ALT 8 09/20/2016   ALKPHOS 77 09/20/2016   BILITOT 1.3 (H) 09/08/2015 2325   GFRNONAA >60 09/13/2015 0448   GFRAA >60 09/13/2015 0448    Recent Labs  09/20/16  NA 141  K 4.0  BUN 20  CREATININE 0.9    Recent Labs  09/20/16  AST 21  ALT 8  ALKPHOS 77    Recent Labs  09/20/16  WBC 4.7  HGB 13.7  HCT 42  PLT 143*    Recent Labs  09/20/16  CHOL 149  LDLCALC 69  TRIG 58   No results found for: [facility] Lab Results  Component Value Date   TSH 4.53 01/09/2017   Lab Results  Component Value Date   HGBA1C 5.1 09/20/2016   Lab Results  Component Value Date   CHOL 149 09/20/2016   HDL 68 09/20/2016   LDLCALC 69 09/20/2016   TRIG 58 09/20/2016    Significant Diagnostic Results in last 30 days:  No results found.  Assessment and Plan  Dementia without behavioral disturbance Pt continues to do well with  activities in and out  of room; plan to cont supportive care  Depression Pt now on remeron 30 mg qHS;plan cont current meds  PVD (peripheral vascular disease) ([place]) No reported breakdowns or wounds;plan to cont ASA 325 mg daily     Anne D. Sheppard Coil, MD

## 2017-01-09 DIAGNOSIS — E039 Hypothyroidism, unspecified: Secondary | ICD-10-CM | POA: Diagnosis not present

## 2017-01-09 LAB — TSH: TSH: 4.53 u[IU]/mL (ref 0.41–5.90)

## 2017-01-15 DIAGNOSIS — Z961 Presence of intraocular lens: Secondary | ICD-10-CM | POA: Diagnosis not present

## 2017-01-15 DIAGNOSIS — H353233 Exudative age-related macular degeneration, bilateral, with inactive scar: Secondary | ICD-10-CM | POA: Diagnosis not present

## 2017-01-15 DIAGNOSIS — H26492 Other secondary cataract, left eye: Secondary | ICD-10-CM | POA: Diagnosis not present

## 2017-01-15 DIAGNOSIS — H401134 Primary open-angle glaucoma, bilateral, indeterminate stage: Secondary | ICD-10-CM | POA: Diagnosis not present

## 2017-01-15 DIAGNOSIS — H353223 Exudative age-related macular degeneration, left eye, with inactive scar: Secondary | ICD-10-CM | POA: Diagnosis not present

## 2017-01-22 ENCOUNTER — Encounter: Payer: Self-pay | Admitting: Internal Medicine

## 2017-01-22 ENCOUNTER — Non-Acute Institutional Stay (SKILLED_NURSING_FACILITY): Payer: Medicare Other | Admitting: Internal Medicine

## 2017-01-22 DIAGNOSIS — I4891 Unspecified atrial fibrillation: Secondary | ICD-10-CM

## 2017-01-22 DIAGNOSIS — I5032 Chronic diastolic (congestive) heart failure: Secondary | ICD-10-CM | POA: Diagnosis not present

## 2017-01-22 DIAGNOSIS — E034 Atrophy of thyroid (acquired): Secondary | ICD-10-CM | POA: Diagnosis not present

## 2017-01-22 NOTE — Progress Notes (Signed)
Location:  [facility] Room Number: 213W Place of Service:  SNF [phone])  Mariah Homes, MD  Patient Care Team: [name], MD as PCP - General (Internal Medicine)  Extended Emergency Contact Information Primary Emergency Contact: Mariah [name] Address: [address]          [place], [place] [address] [name] of [place] Phone: [phone] Relation: Daughter    Allergies: Lyrica [pregabalin] and Penicillins  Chief Complaint  Patient presents with  . Medical Management of Chronic Issues    Routine Visit    HPI: Patient is 83 y.o. female who is being seen for routine issues of AF, CHF and hypothyroidism.   Past Medical History:  Diagnosis Date  . Atrial fibrillation ([place])   . CHF (congestive heart failure) ([place])   . COPD (chronic obstructive pulmonary disease) ([place])   . Glaucoma   . Hypertension     Past Surgical History:  Procedure Laterality Date  . FEMUR IM NAIL Left 09/09/2015   Procedure: INTRAMEDULLARY (IM) NAIL FEMORAL;  Surgeon: Rod Can, MD;  Location: [facility];  Service: Orthopedics;  Laterality: Left;  . HERNIA REPAIR      Allergies as of 01/22/2017      Reactions   Lyrica [pregabalin] Other (See Comments)   Caused her to be lethargic and her legs felt as if she could not stand   Penicillins Rash      Medication List       Accurate as of 01/22/17 11:59 PM. Always use your most recent med list.          acetaminophen 325 MG tablet Commonly known as:  TYLENOL Take 650 mg by mouth every 4 (four) hours as needed for moderate pain.   aspirin 325 MG EC tablet Take 325 mg by mouth daily.   brimonidine 0.1 % Soln Commonly known as:  ALPHAGAN P Place 1 drop into both eyes 2 (two) times daily.   carboxymethylcellulose 1 % ophthalmic solution Place 1 drop into both eyes 2 (two) times daily.   ENSURE PLUS Liqd Take 1 Can by mouth 2 (two) times daily between meals. Offer at bedtime and in between  meals   gabapentin 300 MG capsule Commonly known as:  NEURONTIN Take 300 mg by mouth at bedtime.   guaiFENesin 600 MG 12 hr tablet Commonly known as:  MUCINEX Take 600 mg by mouth 2 (two) times daily.   HYDROcodone-acetaminophen 5-325 MG tablet Commonly known as:  NORCO/VICODIN Take 1 tablet by mouth every 4 (four) hours as needed for moderate pain or severe pain. No more than 3 grams of Tylenol in 24 hours   ICAPS AREDS 2 Caps Take 1 capsule by mouth 2 (two) times daily.   LASIX 40 MG tablet Generic drug:  furosemide Take 40 mg by mouth daily. Take 1 tab with 20 mg to equal 60 mg by mouth daily   furosemide 20 MG tablet Commonly known as:  LASIX Take 20 mg by mouth daily. Take 1 tab with 40 mg to equal 60 mg by mouth daily   latanoprost 0.005 % ophthalmic solution Commonly known as:  XALATAN Place 1 drop into both eyes at bedtime.   levothyroxine 50 MCG tablet Commonly known as:  SYNTHROID, LEVOTHROID Take 50 mcg by mouth daily before breakfast.   Melatonin 1 MG Tabs Take 1 mg by mouth at bedtime.   metoprolol succinate 50 MG 24 hr tablet Commonly known as:  TOPROL-XL Take 50 mg by  mouth daily. Take with or immediately following a meal.   mirtazapine 30 MG tablet Commonly known as:  REMERON Take 30 mg by mouth at bedtime.   pantoprazole 40 MG tablet Commonly known as:  PROTONIX Take 40 mg by mouth daily.   polyethylene glycol packet Commonly known as:  MIRALAX / GLYCOLAX Take 17 g by mouth every other day.   potassium chloride SA 20 MEQ tablet Commonly known as:  K-DUR,KLOR-CON Take 20 mEq by mouth daily.   spironolactone 25 MG tablet Commonly known as:  ALDACTONE Take 12.5 mg by mouth daily.   traMADol 50 MG tablet Commonly known as:  ULTRAM Take 1 tablet (50 mg total) by mouth at bedtime.   Vitamin D3 5000 units Caps Take 5,000 Units by mouth daily.       Meds ordered this encounter  Medications  . carboxymethylcellulose 1 % ophthalmic  solution    Sig: Place 1 drop into both eyes 2 (two) times daily.    Immunization History  Administered Date(s) Administered  . Influenza, High Dose Seasonal PF 08/19/2014  . Influenza-Unspecified 08/25/2015, 08/16/2016  . PPD Test 10/31/2015, 11/17/2015  . Pneumococcal Conjugate-13 02/04/2015  . Pneumococcal-Unspecified 01/17/2009  . Tdap 07/20/2013    Social History  Substance Use Topics  . Smoking status: Never Smoker  . Smokeless tobacco: Never Used  . Alcohol use No    Review of Systems  DATA OBTAINED: from patient- can not fully participate; nursing without concerns GENERAL:  no fevers, fatigue, appetite changes SKIN: No itching, rash HEENT: No complaint RESPIRATORY: No cough, wheezing, SOB CARDIAC: No chest pain, palpitations, lower extremity edema  GI: No abdominal pain, No N/V/D or constipation, No heartburn or reflux  GU: No dysuria, frequency or urgency, or incontinence  MUSCULOSKELETAL: No unrelieved bone/joint pain NEUROLOGIC: No headache, dizziness  PSYCHIATRIC: No overt anxiety or sadness  Vitals:   01/22/17 1323  BP: 130/71  Pulse: 76  Resp: 18  Temp: 97.9 F (36.6 C)   Body mass index is 22.64 kg/m. Physical Exam  GENERAL APPEARANCE: Alert, conversant, No acute distress  SKIN: No diaphoresis rash HEENT: Unremarkable RESPIRATORY: Breathing is even, unlabored. Lung sounds are clear   CARDIOVASCULAR: Heart RRR no murmurs, rubs or gallops. No peripheral edema  GASTROINTESTINAL: Abdomen is soft, non-tender, not distended w/ normal bowel sounds.  GENITOURINARY: Bladder non tender, not distended  MUSCULOSKELETAL: No abnormal joints or musculature NEUROLOGIC: Cranial nerves 2-12 grossly intact. Moves all extremities PSYCHIATRIC: Mood and affect appropriate to situation with dementia, no behavioral issues  Patient Active Problem List   Diagnosis Date Noted  . Hypothyroidism 02/02/2017  . Depression 07/05/2016  . GERD (gastroesophageal reflux  disease) 06/10/2016  . Cerumen debris on tympanic membrane 03/14/2016  . PVD (peripheral vascular disease) ([place]) 01/24/2016  . Dementia without behavioral disturbance 01/01/2016  . Encounter for family conference with patient present 01/01/2016  . Aspiration of food 12/17/2015  . Speech abnormality 11/29/2015  . Senile purpura ([place]) 11/29/2015  . Hip pain, acute 11/06/2015  . Acute encephalopathy 11/06/2015  . Bilateral lower extremity edema 11/06/2015  . UTI (urinary tract infection) 11/06/2015  . Chronic respiratory failure with hypoxia ([place]) 11/06/2015  . Closed left hip fracture ([place]) 09/09/2015  . Chronic diastolic CHF (congestive heart failure) ([place]) 09/09/2015  . COPD (chronic obstructive pulmonary disease) ([place])   . Atrial fibrillation ([place])   . Hypertensive heart disease with CHF (congestive heart failure) ([place])   . Chronic bronchitis ([place])   . Essential hypertension   .  Fall   . Closed pertrochanteric fracture ([facility])     CMP     Component Value Date/Time   NA 141 09/20/2016   K 4.0 09/20/2016   CL 107 09/13/2015 0448   CO2 24 09/13/2015 0448   GLUCOSE 101 (H) 09/13/2015 0448   BUN 20 09/20/2016   CREATININE 0.9 09/20/2016   CREATININE 0.66 09/13/2015 0448   CALCIUM 8.7 (L) 09/13/2015 0448   PROT 6.9 09/08/2015 2325   ALBUMIN 4.1 09/08/2015 2325   AST 21 09/20/2016   ALT 8 09/20/2016   ALKPHOS 77 09/20/2016   BILITOT 1.3 (H) 09/08/2015 2325   GFRNONAA >60 09/13/2015 0448   GFRAA >60 09/13/2015 0448    Recent Labs  09/20/16  NA 141  K 4.0  BUN 20  CREATININE 0.9    Recent Labs  09/20/16  AST 21  ALT 8  ALKPHOS 77    Recent Labs  09/20/16  WBC 4.7  HGB 13.7  HCT 42  PLT 143*    Recent Labs  09/20/16  CHOL 149  LDLCALC 69  TRIG 58   No results found for: [facility] Lab Results  Component Value Date   TSH 4.53 01/09/2017   Lab Results  Component Value Date   HGBA1C 5.1 09/20/2016   Lab Results  Component Value Date   CHOL 149  09/20/2016   HDL 68 09/20/2016   LDLCALC 69 09/20/2016   TRIG 58 09/20/2016    Significant Diagnostic Results in last 30 days:  No results found.  Assessment and Plan  Atrial fibrillation ([facility]) Stable; cont metoprolol XL 50 mg daily for rate and ASA 325 mg daily as propylaxis  Chronic diastolic CHF (congestive heart failure) ([facility]) No reported exacerbations; plan to cont toprol XL 50 mg daily, aldactone 12.5 mg BID and lasix 60 mg daily  Hypothyroidism TSH 4.53 on synthroid 50 mcg dailyl plan to inc to 75 mcg daily and repeat TSH on 3/26     Mariah [name] D. Sheppard Coil, MD

## 2017-01-24 ENCOUNTER — Encounter: Payer: Self-pay | Admitting: Internal Medicine

## 2017-01-24 NOTE — Assessment & Plan Note (Signed)
No reported breakdowns or wounds;plan to cont ASA 325 mg daily

## 2017-01-24 NOTE — Assessment & Plan Note (Signed)
Pt continues to do well with  activities in and out of room; plan to cont supportive care

## 2017-01-24 NOTE — Assessment & Plan Note (Signed)
Pt now on remeron 30 mg qHS;plan cont current meds

## 2017-01-30 ENCOUNTER — Encounter: Payer: Self-pay | Admitting: Internal Medicine

## 2017-01-30 NOTE — Progress Notes (Signed)
Location:  [facility] Room Number: 213W Place of Service:  SNF [phone])  [name], MD  Patient Care Team: [name], MD as PCP - General (Internal Medicine)  Extended Emergency Contact Information Primary Emergency Contact: [name],[name] Address: [address]          [place],  [address] [name] of [place] Phone: [phone] Relation: Daughter    Allergies: Lyrica [pregabalin] and Penicillins  Chief Complaint  Patient presents with  . Acute Visit    HPI: Patient is 83 y.o. female who is being seen acutely because an outbreak of Influenza A per CDC guidelines was recognized on 12/19/2016. Pt has no c/o flu like symptoms;therefore pt will need to be prophylaxed with Tamiflu for a minimum of 14 days per CDC protocol.  Past Medical History:  Diagnosis Date  . Atrial fibrillation ([place])   . CHF (congestive heart failure) ([place])   . COPD (chronic obstructive pulmonary disease) ([place])   . Glaucoma   . Hypertension     Past Surgical History:  Procedure Laterality Date  . FEMUR IM NAIL Left 09/09/2015   Procedure: INTRAMEDULLARY (IM) NAIL FEMORAL;  Surgeon: Rod Can, MD;  Location: [facility];  Service: Orthopedics;  Laterality: Left;  . HERNIA REPAIR      Allergies as of 12/20/2016      Reactions   Lyrica [pregabalin] Other (See Comments)   Caused her to be lethargic and her legs felt as if she could not stand   Penicillins Rash      Medication List       Accurate as of 12/20/16 11:59 PM. Always use your most recent med list.          acetaminophen 325 MG tablet Commonly known as:  TYLENOL Take 650 mg by mouth every 4 (four) hours as needed for moderate pain.   aspirin 325 MG EC tablet Take 325 mg by mouth daily.   brimonidine 0.1 % Soln Commonly known as:  ALPHAGAN P Place 1 drop into both eyes 2 (two) times daily.   carboxymethylcellulose 1 % ophthalmic solution Place 1 drop into both eyes 2 (two)  times daily.   ENSURE PLUS Liqd Take 1 Can by mouth 2 (two) times daily between meals. Offer at bedtime and in between meals   gabapentin 300 MG capsule Commonly known as:  NEURONTIN Take 300 mg by mouth at bedtime.   guaiFENesin 600 MG 12 hr tablet Commonly known as:  MUCINEX Take 600 mg by mouth 2 (two) times daily.   HYDROcodone-acetaminophen 5-325 MG tablet Commonly known as:  NORCO/VICODIN Take 1 tablet by mouth every 4 (four) hours as needed for moderate pain or severe pain. No more than 3 grams of Tylenol in 24 hours   ICAPS AREDS 2 Caps Take 1 capsule by mouth 2 (two) times daily.   LASIX 40 MG tablet Generic drug:  furosemide Take 40 mg by mouth daily. Take 1 tab with 20 mg to equal 60 mg by mouth daily   furosemide 20 MG tablet Commonly known as:  LASIX Take 20 mg by mouth daily. Take 1 tab with 40 mg to equal 60 mg by mouth daily   latanoprost 0.005 % ophthalmic solution Commonly known as:  XALATAN Place 1 drop into both eyes at bedtime.   levothyroxine 50 MCG tablet Commonly known as:  SYNTHROID, LEVOTHROID Take 50 mcg by mouth daily before breakfast.   Melatonin 1 MG Tabs Take 1 mg by  mouth at bedtime.   metoprolol succinate 50 MG 24 hr tablet Commonly known as:  TOPROL-XL Take 50 mg by mouth daily. Take with or immediately following a meal.   mirtazapine 30 MG tablet Commonly known as:  REMERON Take 30 mg by mouth at bedtime.   pantoprazole 40 MG tablet Commonly known as:  PROTONIX Take 40 mg by mouth daily.   polyethylene glycol packet Commonly known as:  MIRALAX / GLYCOLAX Take 17 g by mouth every other day.   potassium chloride SA 20 MEQ tablet Commonly known as:  K-DUR,KLOR-CON Take 20 mEq by mouth daily.   spironolactone 25 MG tablet Commonly known as:  ALDACTONE Take 12.5 mg by mouth daily.   traMADol 50 MG tablet Commonly known as:  ULTRAM Take 1 tablet (50 mg total) by mouth at bedtime.   Vitamin D3 5000 units Caps Take 5,000  Units by mouth daily.       No orders of the defined types were placed in this encounter.   Immunization History  Administered Date(s) Administered  . Influenza, High Dose Seasonal PF 08/19/2014  . Influenza-Unspecified 08/25/2015, 08/16/2016  . PPD Test 10/31/2015, 11/17/2015  . Pneumococcal Conjugate-13 02/04/2015  . Pneumococcal-Unspecified 01/17/2009  . Tdap 07/20/2013    Social History  Substance Use Topics  . Smoking status: Never Smoker  . Smokeless tobacco: Never Used  . Alcohol use No    Review of Systems  DATA OBTAINED: from patient, nurse GENERAL:  no fevers SKIN: No itching, rash HEENT: no rhinorrhea, congestion, ST or ear pain RESPIRATORY: No cough, wheezing, SOB CARDIAC: No chest pain, palpitations, lower extremity edema  GI: No abdominal pain, No N/V/D or constipation, No heartburn or reflux  MUSCULOSKELETAL: No muscle aches NEUROLOGIC: No headache, dizziness   Vitals:   12/20/16 1459  BP: 130/71  Pulse: 76  Resp: 18  Temp: 97.9 F (36.6 C)   Body mass index is 22.64 kg/m. Physical Exam  GENERAL APPEARANCE: Alert, conversant, No acute distress  SKIN: No diaphoresis rash HEENT: Unremarkable RESPIRATORY: Breathing is even, unlabored. Lung sounds are clear   CARDIOVASCULAR: Heart RRR no murmurs, rubs or gallops. No peripheral edema  GASTROINTESTINAL: Abdomen is soft, non-tender, not distended w/ normal bowel sounds.   NEUROLOGIC: Cranial nerves 2-12 grossly intact PSYCHIATRIC: baseline, no mental status changes  Patient Active Problem List   Diagnosis Date Noted  . Depression 07/05/2016  . GERD (gastroesophageal reflux disease) 06/10/2016  . Cerumen debris on tympanic membrane 03/14/2016  . PVD (peripheral vascular disease) ([place]) 01/24/2016  . Dementia without behavioral disturbance 01/01/2016  . Encounter for family conference with patient present 01/01/2016  . Aspiration of food 12/17/2015  . Speech abnormality 11/29/2015  . Senile  purpura ([place]) 11/29/2015  . Hip pain, acute 11/06/2015  . Acute encephalopathy 11/06/2015  . Bilateral lower extremity edema 11/06/2015  . UTI (urinary tract infection) 11/06/2015  . Chronic respiratory failure with hypoxia ([place]) 11/06/2015  . Closed left hip fracture ([place]) 09/09/2015  . Chronic diastolic CHF (congestive heart failure) ([place]) 09/09/2015  . COPD (chronic obstructive pulmonary disease) ([place])   . Atrial fibrillation ([place])   . Hypertensive heart disease with CHF (congestive heart failure) ([place])   . Chronic bronchitis ([place])   . Essential hypertension   . Fall   . Closed pertrochanteric fracture ([facility])     CMP     Component Value Date/Time   NA 141 09/20/2016   K 4.0 09/20/2016   CL 107 09/13/2015 0448   CO2  24 09/13/2015 0448   GLUCOSE 101 (H) 09/13/2015 0448   BUN 20 09/20/2016   CREATININE 0.9 09/20/2016   CREATININE 0.66 09/13/2015 0448   CALCIUM 8.7 (L) 09/13/2015 0448   PROT 6.9 09/08/2015 2325   ALBUMIN 4.1 09/08/2015 2325   AST 21 09/20/2016   ALT 8 09/20/2016   ALKPHOS 77 09/20/2016   BILITOT 1.3 (H) 09/08/2015 2325   GFRNONAA >60 09/13/2015 0448   GFRAA >60 09/13/2015 0448    Recent Labs  09/20/16  NA 141  K 4.0  BUN 20  CREATININE 0.9    Recent Labs  09/20/16  AST 21  ALT 8  ALKPHOS 77    Recent Labs  09/20/16  WBC 4.7  HGB 13.7  HCT 42  PLT 143*    Recent Labs  09/20/16  CHOL 149  LDLCALC 69  TRIG 58   No results found for: [facility] Lab Results  Component Value Date   TSH 4.53 01/09/2017   Lab Results  Component Value Date   HGBA1C 5.1 09/20/2016   Lab Results  Component Value Date   CHOL 149 09/20/2016   HDL 68 09/20/2016   LDLCALC 69 09/20/2016   TRIG 58 09/20/2016    Significant Diagnostic Results in last 30 days:  No results found.  Assessment and Plan  EXPOSURE TO FLU/ INFLUENZA OUTBREAK AT SNF-   CrCl calculated by me-  33.6      Dose for 14 days-  30 mg daily Pt will be monitored daily for flu  like symptoms                                                                                 Webb Silversmith D. Sheppard Coil, MD

## 2017-02-02 ENCOUNTER — Encounter: Payer: Self-pay | Admitting: Internal Medicine

## 2017-02-02 DIAGNOSIS — E039 Hypothyroidism, unspecified: Secondary | ICD-10-CM | POA: Insufficient documentation

## 2017-02-02 HISTORY — DX: Hypothyroidism, unspecified: E03.9

## 2017-02-02 NOTE — Assessment & Plan Note (Signed)
TSH 4.53 on synthroid 50 mcg dailyl plan to inc to 75 mcg daily and repeat TSH on 3/26

## 2017-02-02 NOTE — Assessment & Plan Note (Signed)
Stable; cont metoprolol XL 50 mg daily for rate and ASA 325 mg daily as propylaxis

## 2017-02-02 NOTE — Assessment & Plan Note (Signed)
No reported exacerbations; plan to cont toprol XL 50 mg daily, aldactone 12.5 mg BID and lasix 60 mg daily

## 2017-02-06 DIAGNOSIS — I739 Peripheral vascular disease, unspecified: Secondary | ICD-10-CM | POA: Diagnosis not present

## 2017-02-06 DIAGNOSIS — B351 Tinea unguium: Secondary | ICD-10-CM | POA: Diagnosis not present

## 2017-02-15 DIAGNOSIS — M545 Low back pain: Secondary | ICD-10-CM | POA: Diagnosis not present

## 2017-02-15 DIAGNOSIS — M546 Pain in thoracic spine: Secondary | ICD-10-CM | POA: Diagnosis not present

## 2017-02-20 ENCOUNTER — Encounter: Payer: Self-pay | Admitting: Internal Medicine

## 2017-02-20 NOTE — Progress Notes (Signed)
Opened in error; Disregard.

## 2017-02-21 ENCOUNTER — Other Ambulatory Visit: Payer: Self-pay | Admitting: Internal Medicine

## 2017-02-26 DIAGNOSIS — E039 Hypothyroidism, unspecified: Secondary | ICD-10-CM | POA: Diagnosis not present

## 2017-02-26 LAB — TSH: TSH: 4.23 (ref 0.41–5.90)

## 2017-03-04 ENCOUNTER — Non-Acute Institutional Stay (SKILLED_NURSING_FACILITY): Payer: Medicare Other | Admitting: Internal Medicine

## 2017-03-04 ENCOUNTER — Encounter: Payer: Self-pay | Admitting: Internal Medicine

## 2017-03-04 DIAGNOSIS — I5032 Chronic diastolic (congestive) heart failure: Secondary | ICD-10-CM | POA: Diagnosis not present

## 2017-03-04 DIAGNOSIS — I110 Hypertensive heart disease with heart failure: Secondary | ICD-10-CM | POA: Diagnosis not present

## 2017-03-04 DIAGNOSIS — R600 Localized edema: Secondary | ICD-10-CM | POA: Diagnosis not present

## 2017-03-04 NOTE — Progress Notes (Signed)
Location:  [facility] Room Number: 213W Place of Service:  SNF ([id number])  [name], MD  Patient Care Team: [name], MD as PCP - General (Internal Medicine)  Extended Emergency Contact Information Primary Emergency Contact: [name],[name] Address: [address]          [place], [place] [address] [name] of [place] Phone: [phone] Relation: Daughter    Allergies: Lyrica [pregabalin] and Penicillins  Chief Complaint  Patient presents with  . Medical Management of Chronic Issues    Routine Visit    HPI: Patient is 83 y.o. female who is being seen for routine issues of BLE edema, HTN and CHF.  Past Medical History:  Diagnosis Date  . Atrial fibrillation ([place])   . CHF (congestive heart failure) ([place])   . COPD (chronic obstructive pulmonary disease) ([place])   . Glaucoma   . Hypertension     Past Surgical History:  Procedure Laterality Date  . FEMUR IM NAIL Left 09/09/2015   Procedure: INTRAMEDULLARY (IM) NAIL FEMORAL;  Surgeon: Rod Can, MD;  [facility];  Service: Orthopedics;  Laterality: Left;  . HERNIA REPAIR      Allergies as of 03/04/2017      Reactions   Lyrica [pregabalin] Other (See Comments)   Caused her to be lethargic and her legs felt as if she could not stand   Penicillins Rash      Medication List       Accurate as of 03/04/17 11:59 PM. Always use your most recent med list.          acetaminophen 325 MG tablet Commonly known as:  TYLENOL Take 650 mg by mouth every 4 (four) hours as needed for moderate pain.   aspirin 325 MG EC tablet Take 325 mg by mouth daily.   brimonidine 0.1 % Soln Commonly known as:  ALPHAGAN P Place 1 drop into both eyes 2 (two) times daily.   carboxymethylcellulose 1 % ophthalmic solution Place 1 drop into both eyes 2 (two) times daily.   ENSURE PLUS Liqd Take 1 Can by mouth 2 (two) times daily between meals. Offer at bedtime and in between  meals   gabapentin 300 MG capsule Commonly known as:  NEURONTIN Take 300 mg by mouth at bedtime.   guaiFENesin 600 MG 12 hr tablet Commonly known as:  MUCINEX Take 600 mg by mouth 2 (two) times daily.   HYDROcodone-acetaminophen 5-325 MG tablet Commonly known as:  NORCO/VICODIN Take 1 tablet by mouth every 4 (four) hours as needed for moderate pain or severe pain. No more than 3 grams of Tylenol in 24 hours   ICAPS AREDS 2 Caps Take 1 capsule by mouth 2 (two) times daily.   LASIX 40 MG tablet Generic drug:  furosemide Take 40 mg by mouth daily. Take 1 tab with 20 mg to equal 60 mg by mouth daily   furosemide 20 MG tablet Commonly known as:  LASIX Take 20 mg by mouth daily. Take 1 tab with 40 mg to equal 60 mg by mouth daily   latanoprost 0.005 % ophthalmic solution Commonly known as:  XALATAN Place 1 drop into both eyes at bedtime.   levothyroxine 75 MCG tablet Commonly known as:  SYNTHROID, LEVOTHROID Take 75 mcg by mouth daily before breakfast.   Melatonin 1 MG Tabs Take 1 mg by mouth at bedtime.   metoprolol succinate 50 MG 24 hr tablet Commonly known as:  TOPROL-XL Take 50 mg  by mouth daily. Take with or immediately following a meal.   mirtazapine 15 MG tablet Commonly known as:  REMERON Take 15 mg by mouth at bedtime.   pantoprazole 40 MG tablet Commonly known as:  PROTONIX Take 40 mg by mouth daily.   polyethylene glycol packet Commonly known as:  MIRALAX / GLYCOLAX Take 17 g by mouth every other day.   potassium chloride SA 20 MEQ tablet Commonly known as:  K-DUR,KLOR-CON Take 20 mEq by mouth daily.   spironolactone 25 MG tablet Commonly known as:  ALDACTONE Take 12.5 mg by mouth daily.   traMADol 50 MG tablet Commonly known as:  ULTRAM Take 1 tablet (50 mg total) by mouth at bedtime.   Vitamin D3 5000 units Caps Take 5,000 Units by mouth daily.       No orders of the defined types were placed in this encounter.   Immunization History   Administered Date(s) Administered  . Influenza, High Dose Seasonal PF 08/19/2014  . Influenza-Unspecified 08/25/2015, 08/16/2016  . PPD Test 10/31/2015, 11/17/2015  . Pneumococcal Conjugate-13 02/04/2015  . Pneumococcal-Unspecified 01/17/2009  . Tdap 07/20/2013    Social History  Substance Use Topics  . Smoking status: Never Smoker  . Smokeless tobacco: Never Used  . Alcohol use No    Review of Systems  DATA OBTAINED: from patient, nurse GENERAL:  no fevers, fatigue, appetite changes SKIN: No itching, rash HEENT: No complaint RESPIRATORY: No cough, wheezing, SOB CARDIAC: No chest pain, palpitations, lower extremity edema  GI: No abdominal pain, No N/V/D or constipation, No heartburn or reflux  GU: No dysuria, frequency or urgency, or incontinence  MUSCULOSKELETAL: No unrelieved bone/joint pain NEUROLOGIC: No headache, dizziness  PSYCHIATRIC: No overt anxiety or sadness  Vitals:   03/04/17 0847  BP: 130/71  Pulse: 79  Resp: 16  Temp: 97.9 F (36.6 C)   Body mass index is 24.34 kg/m. Physical Exam  GENERAL APPEARANCE: Alert, conversant, No acute distress  SKIN: No diaphoresis rash HEENT: Unremarkable RESPIRATORY: Breathing is even, unlabored. Lung sounds are clear   CARDIOVASCULAR: Heart RRR no murmurs, rubs or gallops. trace peripheral edema  GASTROINTESTINAL: Abdomen is soft, non-tender, not distended w/ normal bowel sounds.  GENITOURINARY: Bladder non tender, not distended  MUSCULOSKELETAL: No abnormal joints or musculature NEUROLOGIC: Cranial nerves 2-12 grossly intact. Moves all extremities PSYCHIATRIC: Mood and affect with dementia, no behavioral issues  Patient Active Problem List   Diagnosis Date Noted  . Hypothyroidism 02/02/2017  . Depression 07/05/2016  . GERD (gastroesophageal reflux disease) 06/10/2016  . Cerumen debris on tympanic membrane 03/14/2016  . PVD (peripheral vascular disease) ([place]) 01/24/2016  . Dementia without behavioral  disturbance 01/01/2016  . Encounter for family conference with patient present 01/01/2016  . Aspiration of food 12/17/2015  . Speech abnormality 11/29/2015  . Senile purpura ([place]) 11/29/2015  . Hip pain, acute 11/06/2015  . Acute encephalopathy 11/06/2015  . Bilateral lower extremity edema 11/06/2015  . UTI (urinary tract infection) 11/06/2015  . Chronic respiratory failure with hypoxia ([place]) 11/06/2015  . Closed left hip fracture ([place]) 09/09/2015  . Chronic diastolic CHF (congestive heart failure) ([place]) 09/09/2015  . COPD (chronic obstructive pulmonary disease) ([place])   . Atrial fibrillation ([place])   . Hypertensive heart disease with CHF (congestive heart failure) ([place])   . Chronic bronchitis ([place])   . Essential hypertension   . Fall   . Closed pertrochanteric fracture ([facility])     CMP     Component Value Date/Time   NA  141 03/27/2017   K 4.7 03/27/2017   CL 107 09/13/2015 0448   CO2 24 09/13/2015 0448   GLUCOSE 101 (H) 09/13/2015 0448   BUN 24 (A) 03/27/2017   CREATININE 0.9 03/27/2017   CREATININE 0.66 09/13/2015 0448   CALCIUM 8.7 (L) 09/13/2015 0448   PROT 6.9 09/08/2015 2325   ALBUMIN 4.1 09/08/2015 2325   AST 21 09/20/2016   ALT 8 09/20/2016   ALKPHOS 77 09/20/2016   BILITOT 1.3 (H) 09/08/2015 2325   GFRNONAA >60 09/13/2015 0448   GFRAA >60 09/13/2015 0448    Recent Labs  09/20/16 03/27/17  NA 141 141  K 4.0 4.7  BUN 20 24*  CREATININE 0.9 0.9    Recent Labs  09/20/16  AST 21  ALT 8  ALKPHOS 77    Recent Labs  09/20/16  WBC 4.7  HGB 13.7  HCT 42  PLT 143*    Recent Labs  09/20/16  CHOL 149  LDLCALC 69  TRIG 58   No results found for: [facility] Lab Results  Component Value Date   TSH 4.53 01/09/2017   Lab Results  Component Value Date   HGBA1C 5.1 09/20/2016   Lab Results  Component Value Date   CHOL 149 09/20/2016   HDL 68 09/20/2016   LDLCALC 69 09/20/2016   TRIG 58 09/20/2016    Significant Diagnostic Results in last 30  days:  No results found.  Assessment and Plan  Hypertensive heart disease with CHF (congestive heart failure) ([facility]) Controlled ;plan to cont aldactone 12.5 mg daily, lasix 60 mg daily and torpol XL 50 mg daily  Chronic diastolic CHF (congestive heart failure) ([facility]) No reported exacerbations or weight gains;plan to cont lasix 60 mg daily, aldactone 12.5 mg daily and toprolXl 50 mg daily  Bilateral lower extremity edema Waxes ans wanes but lately stable; plan top cont lasix daily, aldactone 12.5 ng daily, and encourage leg elevation    Duane Trias D. Sheppard Coil, MD

## 2017-03-24 IMAGING — CR DG KNEE 1-2V*L*
1 series · 2 of 2 positions shown · non-contrast
Comparison: None.

CLINICAL DATA: [AGE] female with left hip fracture after
falling yesterday. Pain radiates toward the knee.

EXAM:
LEFT KNEE - 1-2 VIEW

[Series 1: AP · left · 2 of 2 slices shown]
[im 1/2]
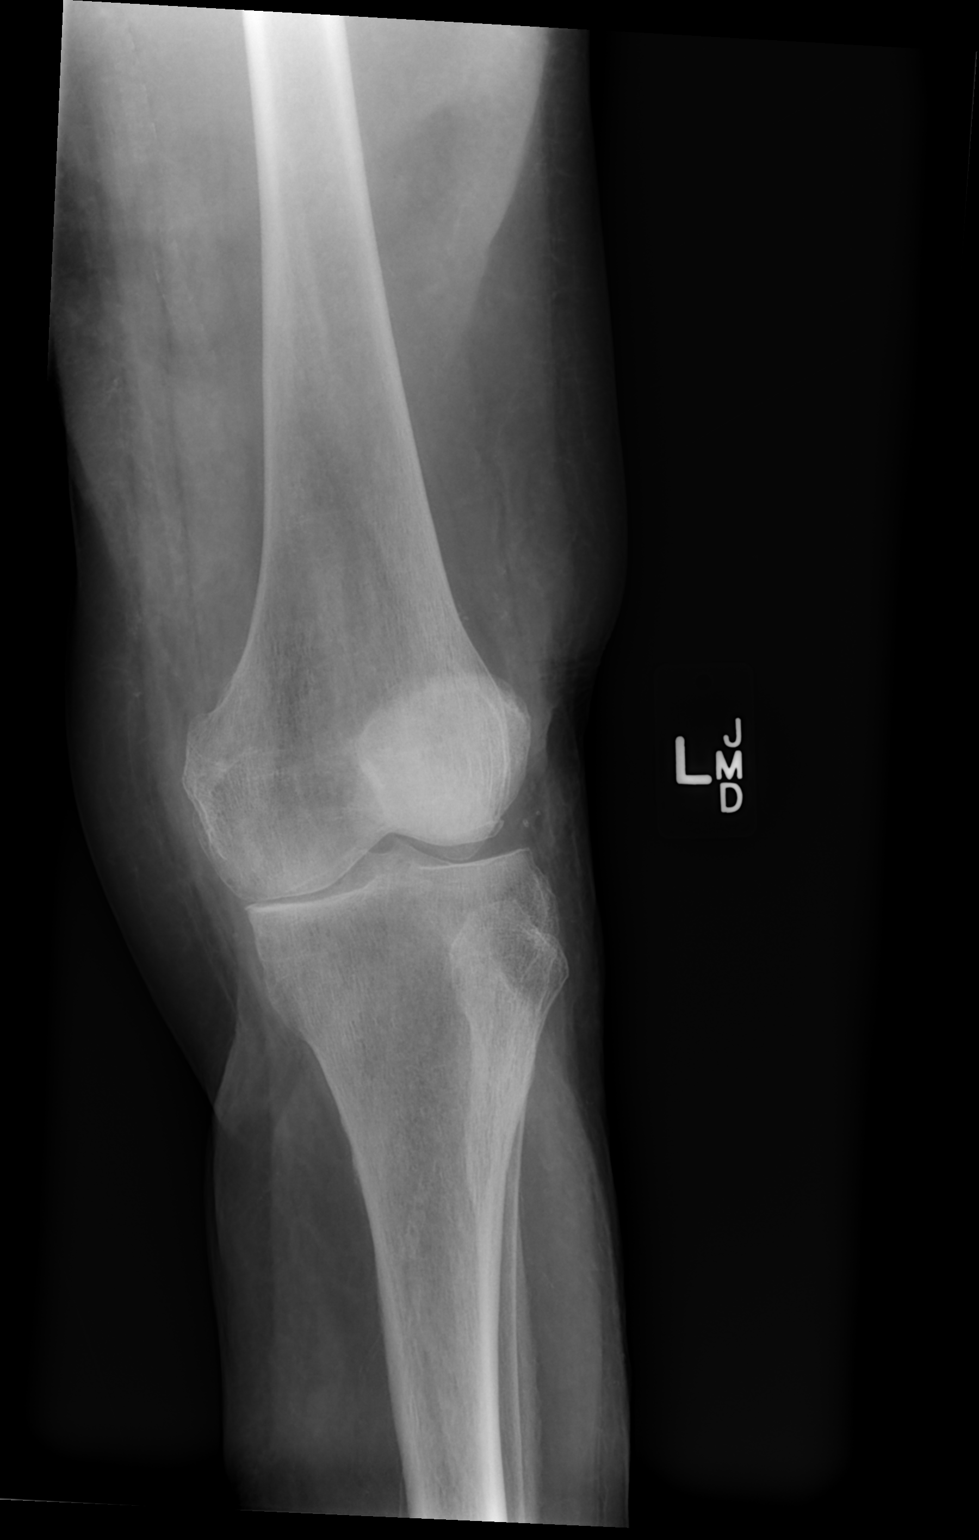
[im 2/2]
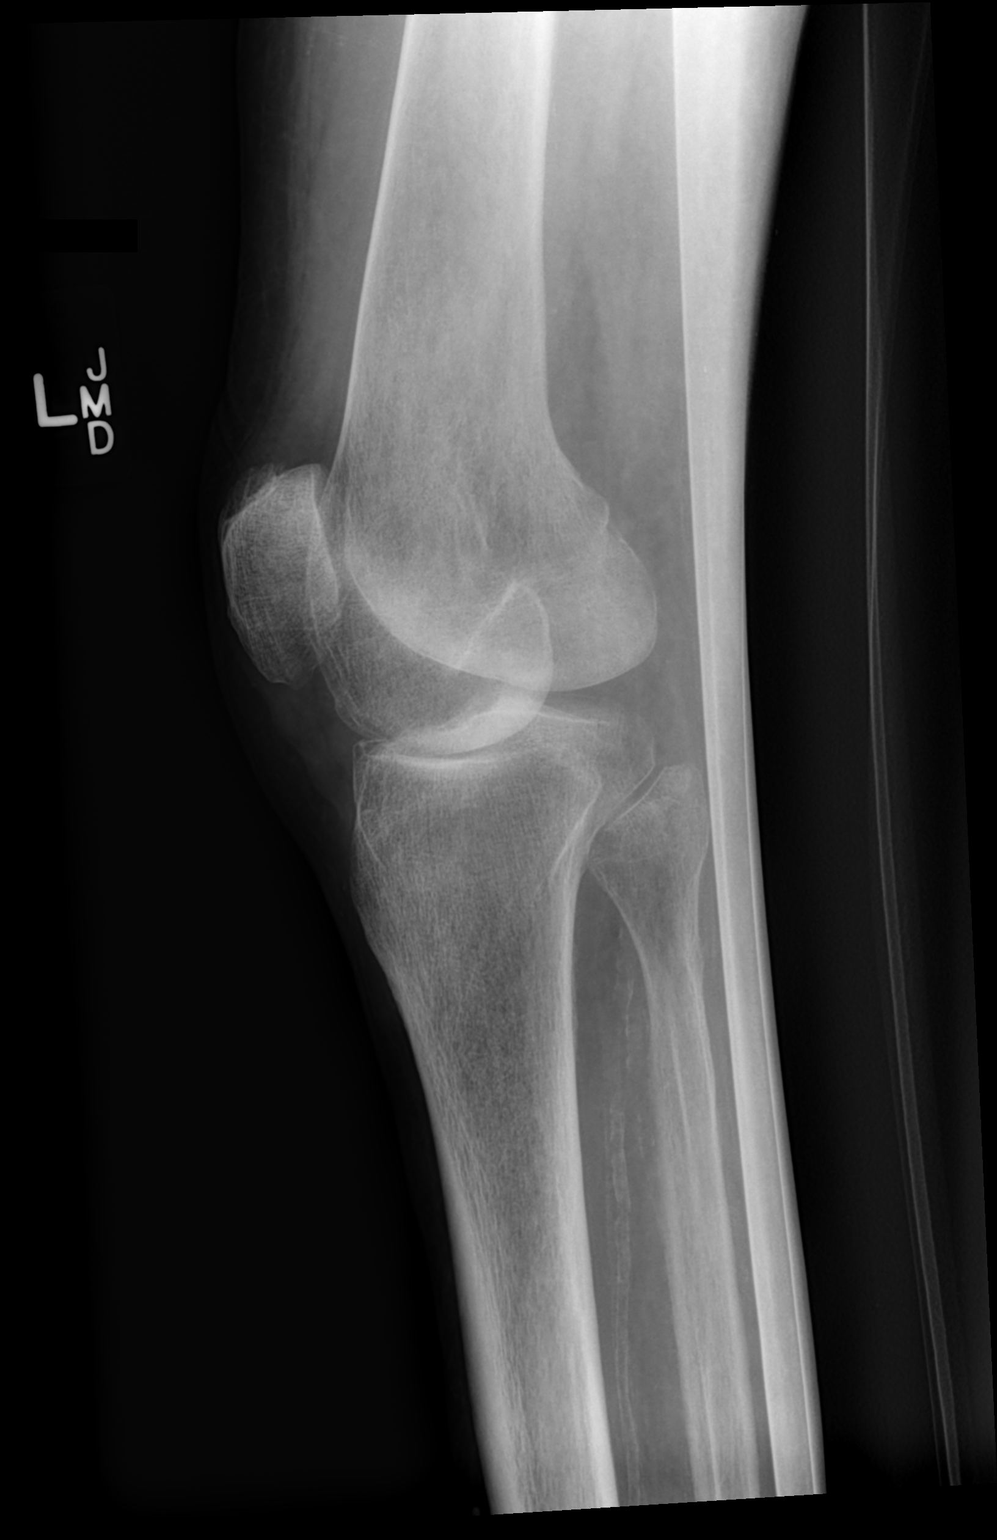

[2 of 2 positions shown; findings below may reference images not displayed]

FINDINGS: No evidence of acute fracture, malalignment or joint effusion. There
is narrowing of the medial compartmental joint space consistent with
osteoarthritis. Atherosclerotic calcifications noted in the
superficial femoral artery and runoff vessels.
IMPRESSION: 1. No acute fracture, malalignment or joint effusion.
2. Medial compartmental osteoarthritis.
3. Atherosclerotic vascular calcifications.

## 2017-03-24 IMAGING — RF DG FEMUR 2+V*L*
1 series · 4 of 4 positions shown · non-contrast
Comparison: 09/08/2015

CLINICAL DATA: Intertrochanteric hip fracture fixation

EXAM:
DG C-ARM 61-120 MIN-NO REPORT; LEFT FEMUR 2 VIEWS

[Series 1: run · 4 of 4 slices shown]
[im 1/4]
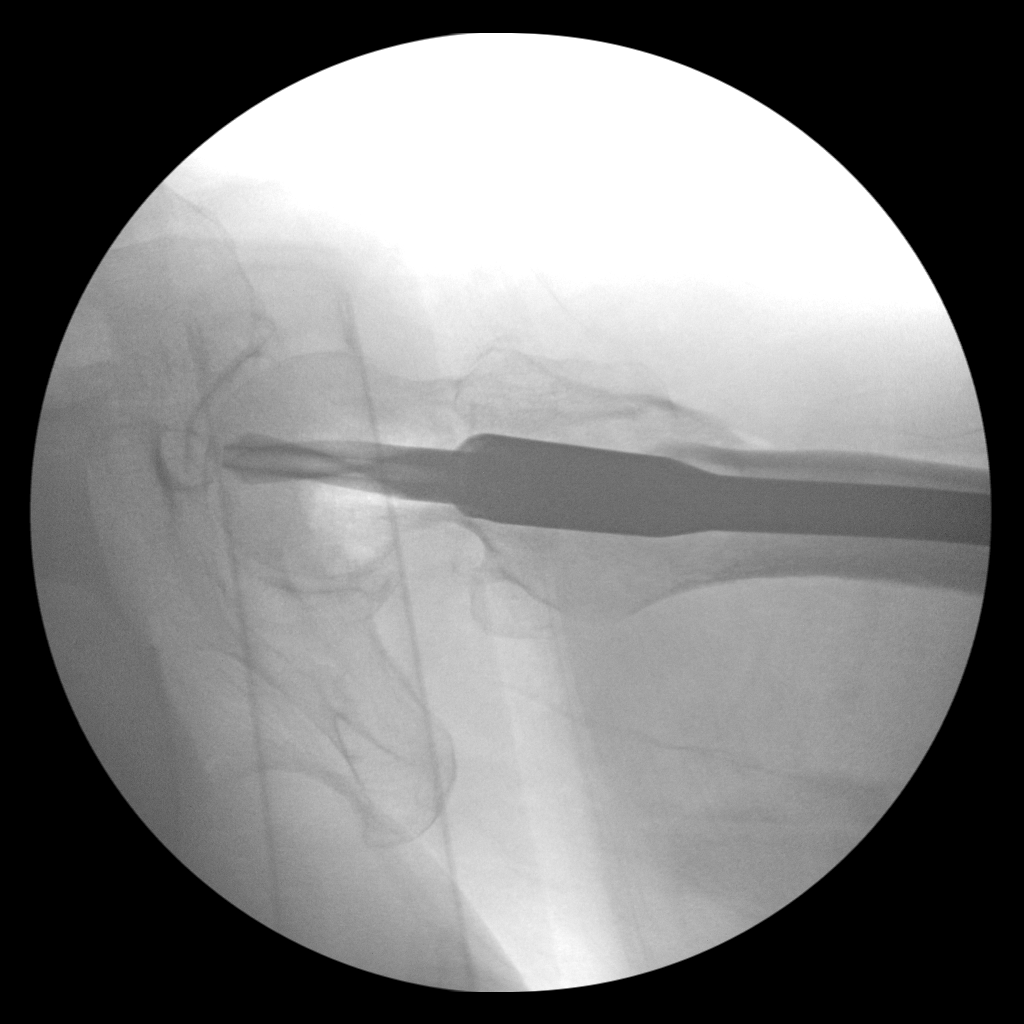
[im 2/4]
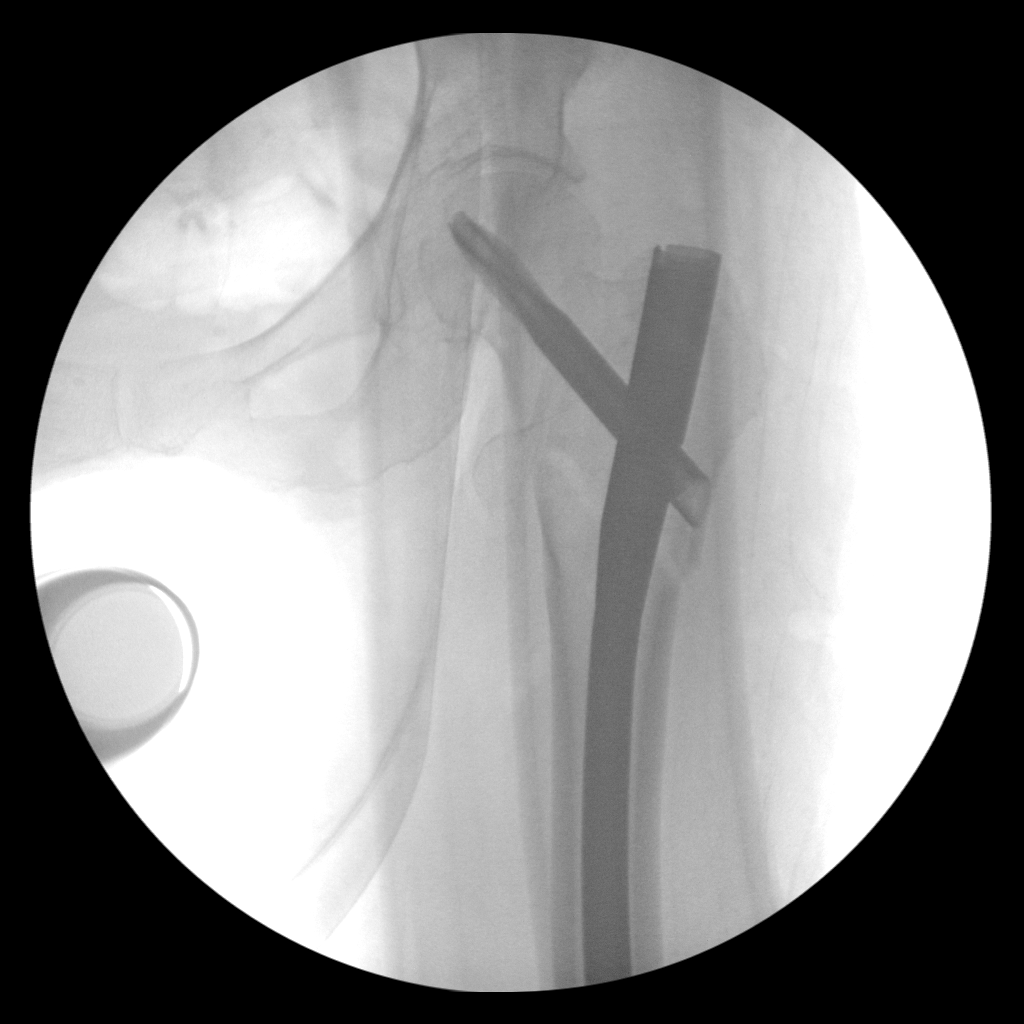
[im 3/4]
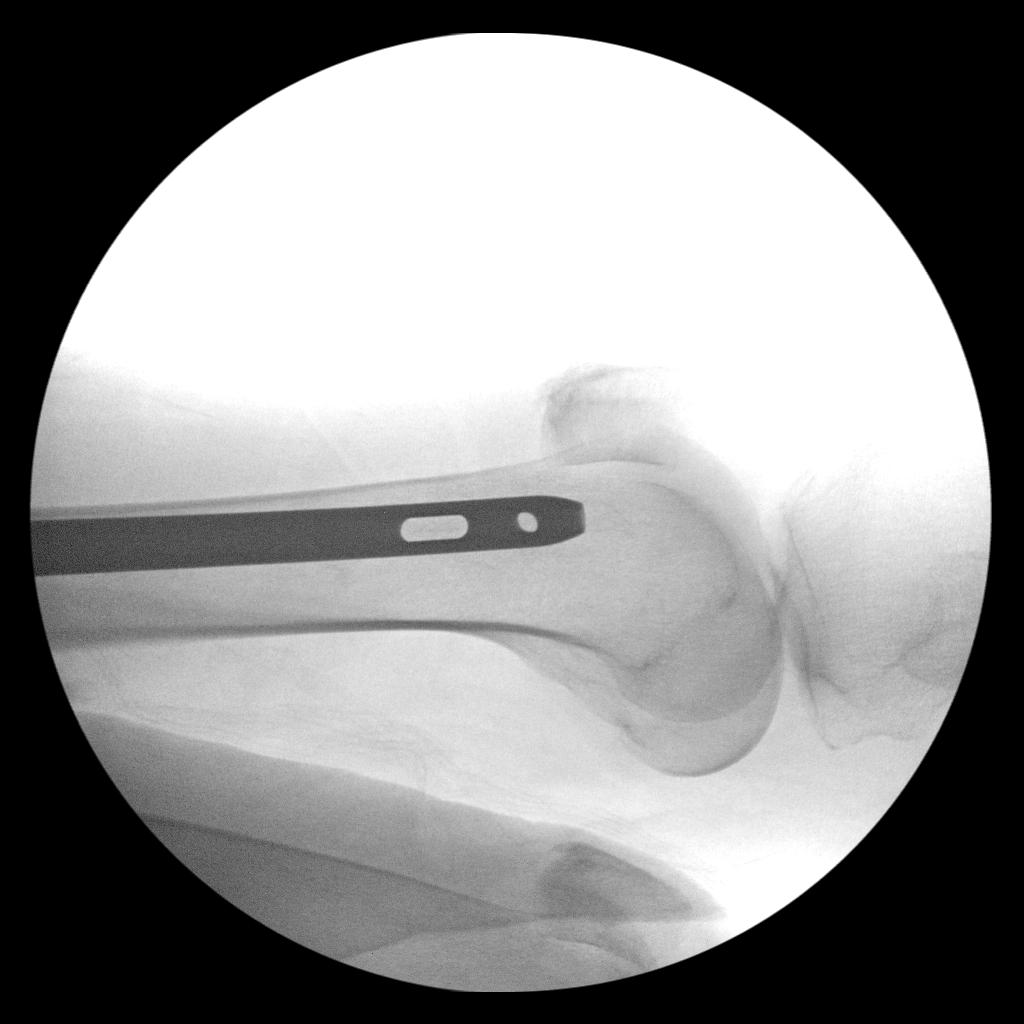
[im 4/4]
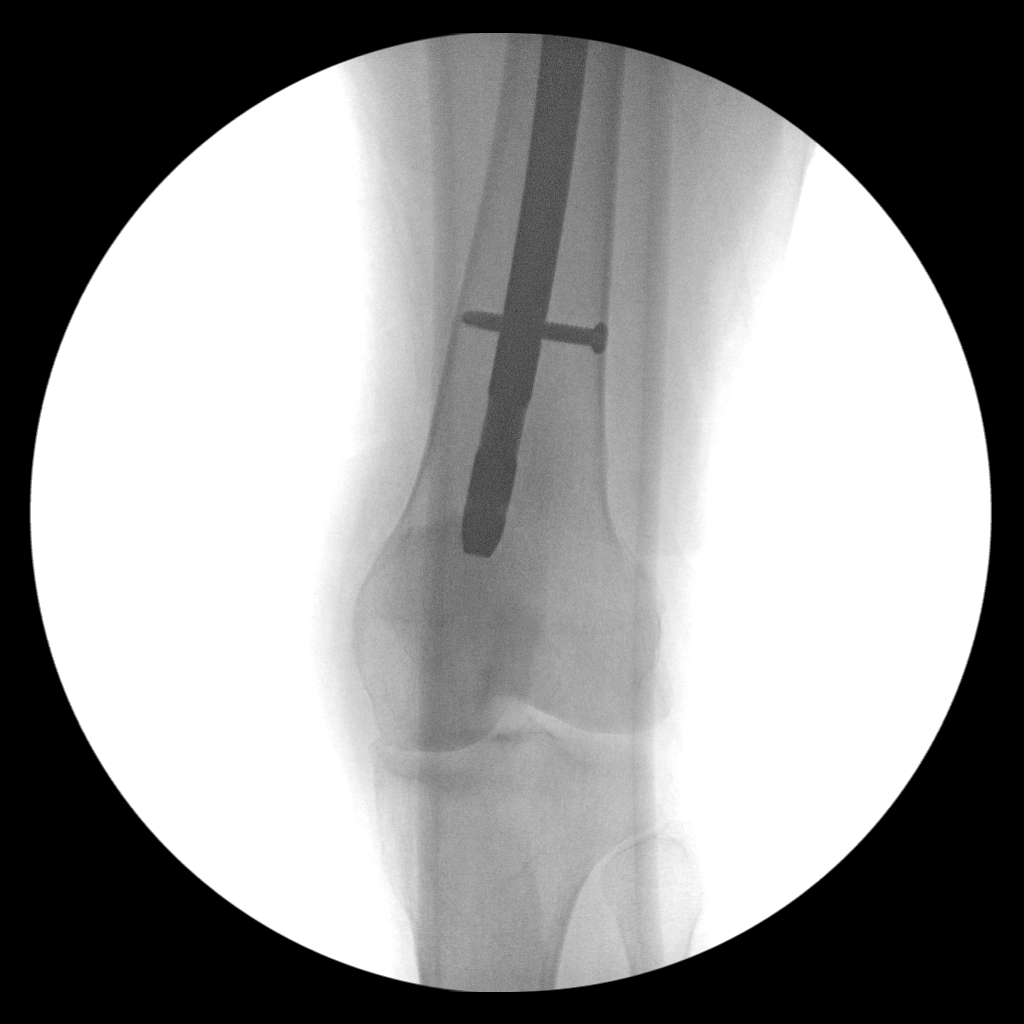

[4 of 4 positions shown; findings below may reference images not displayed]

FINDINGS: There is a long gamma nail, a proximal dynamic hip screw and a
single distal interlocking screw transfixing the intertrochanteric
fracture with near anatomic reduction.
IMPRESSION: Internal fixation of left hip fracture with anatomic reduction and
no complicating features.

## 2017-03-24 IMAGING — DX DG FEMUR 2+V*L*
4 series · 4 of 4 positions shown · non-contrast
Comparison: None.

CLINICAL DATA: ORIF left hip fracture

EXAM:
LEFT FEMUR 2 VIEWS

[femur ap]
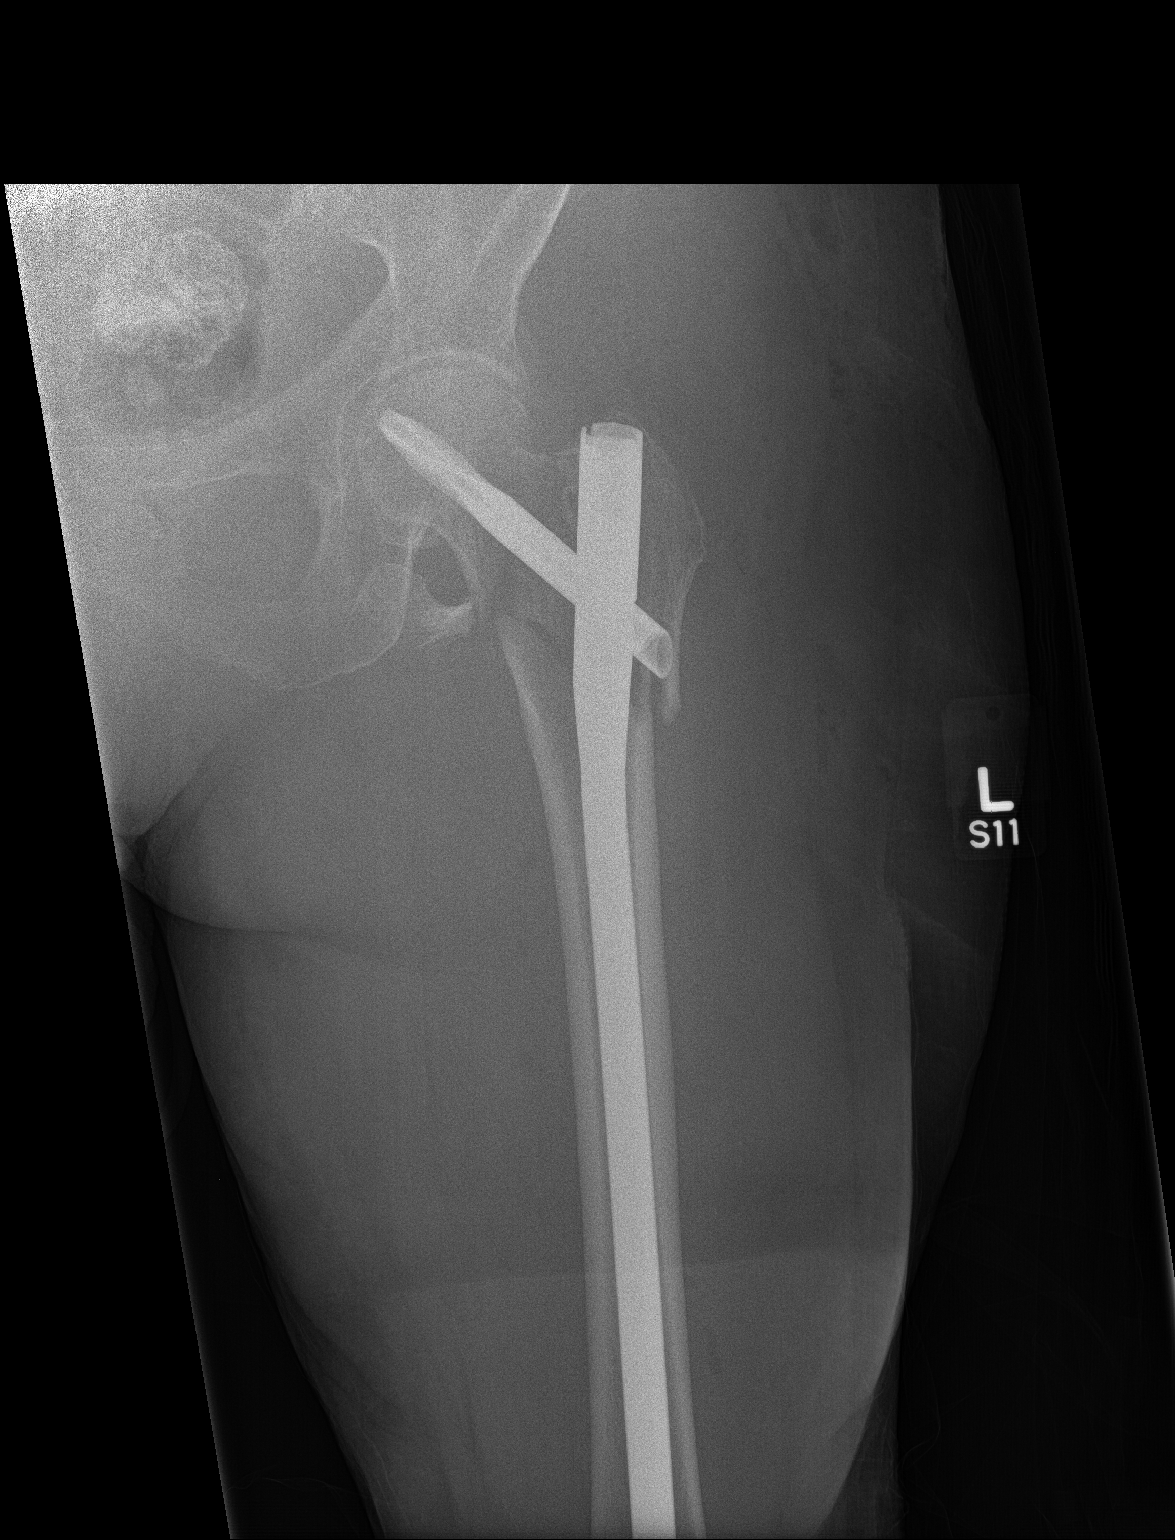

[femur lat]
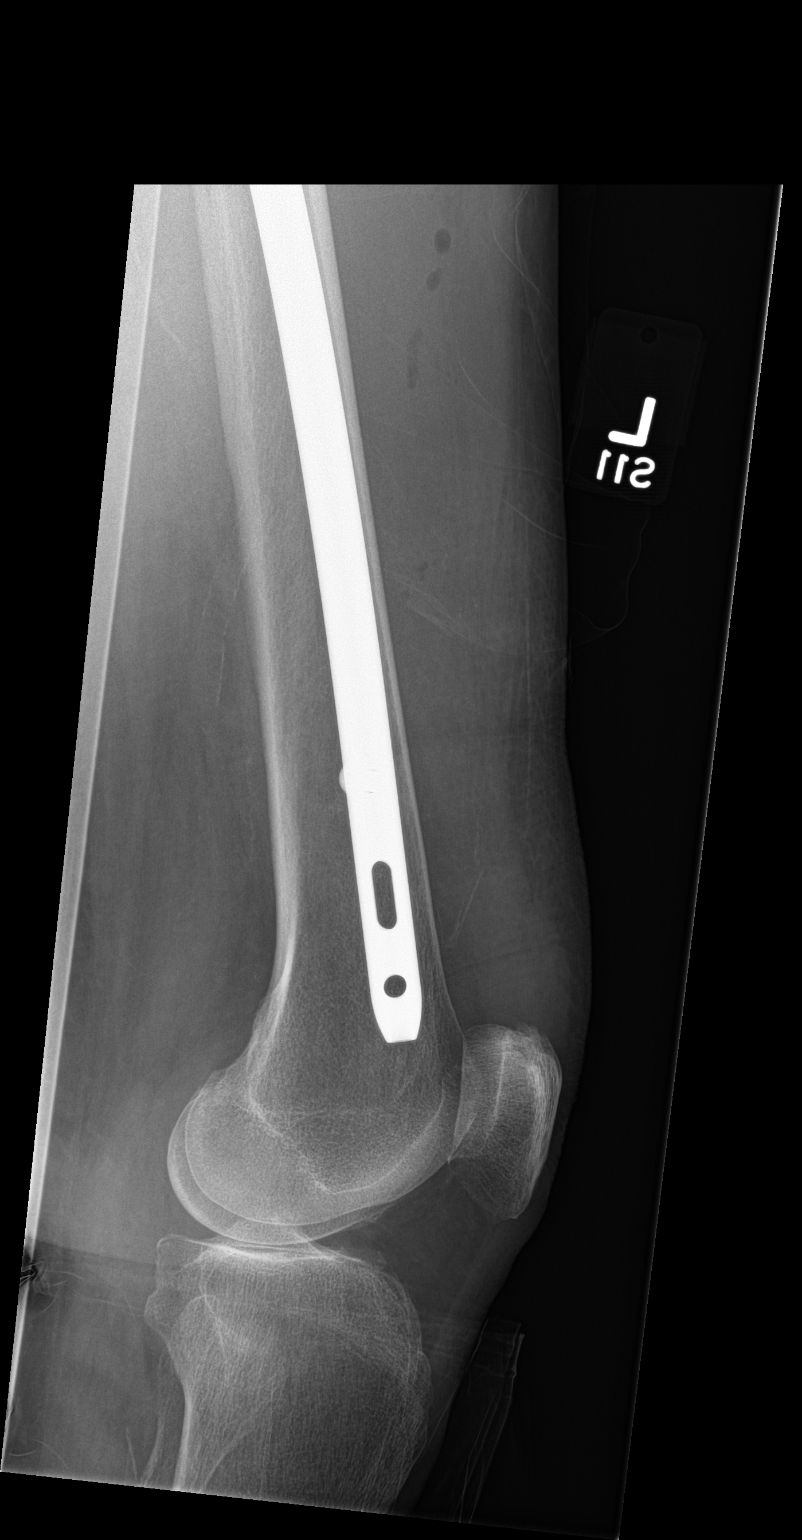

[hip x-table]
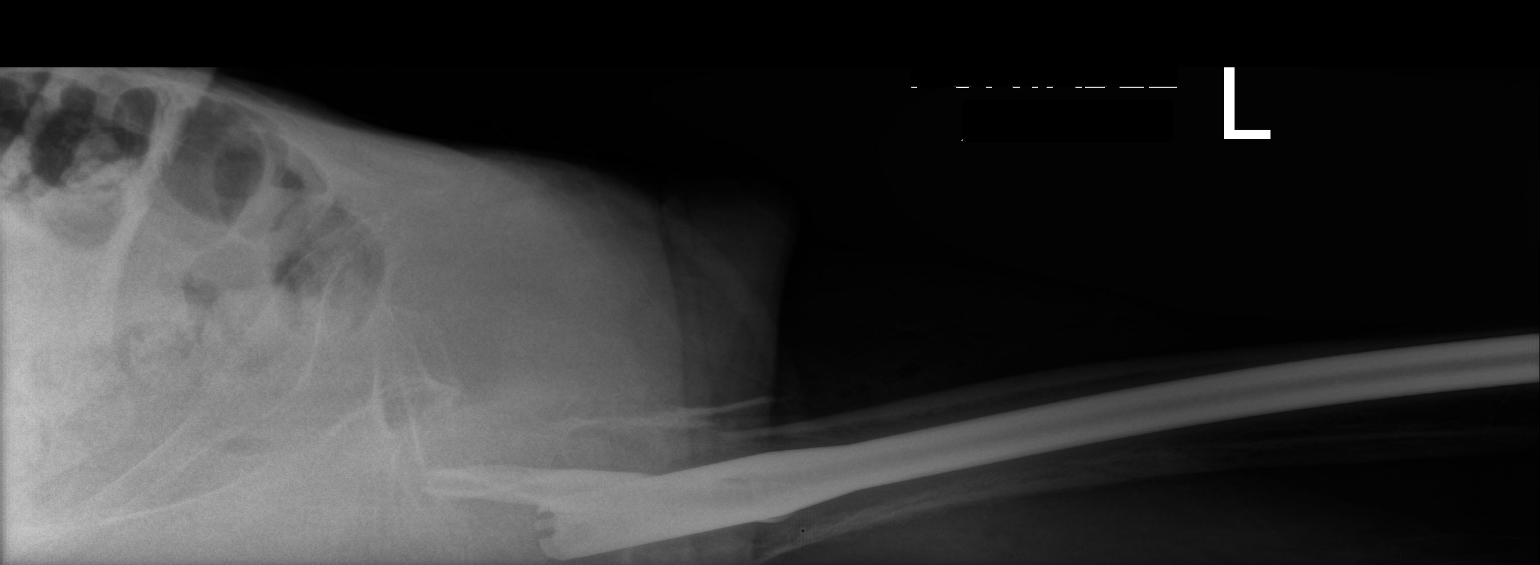

[knee ap]
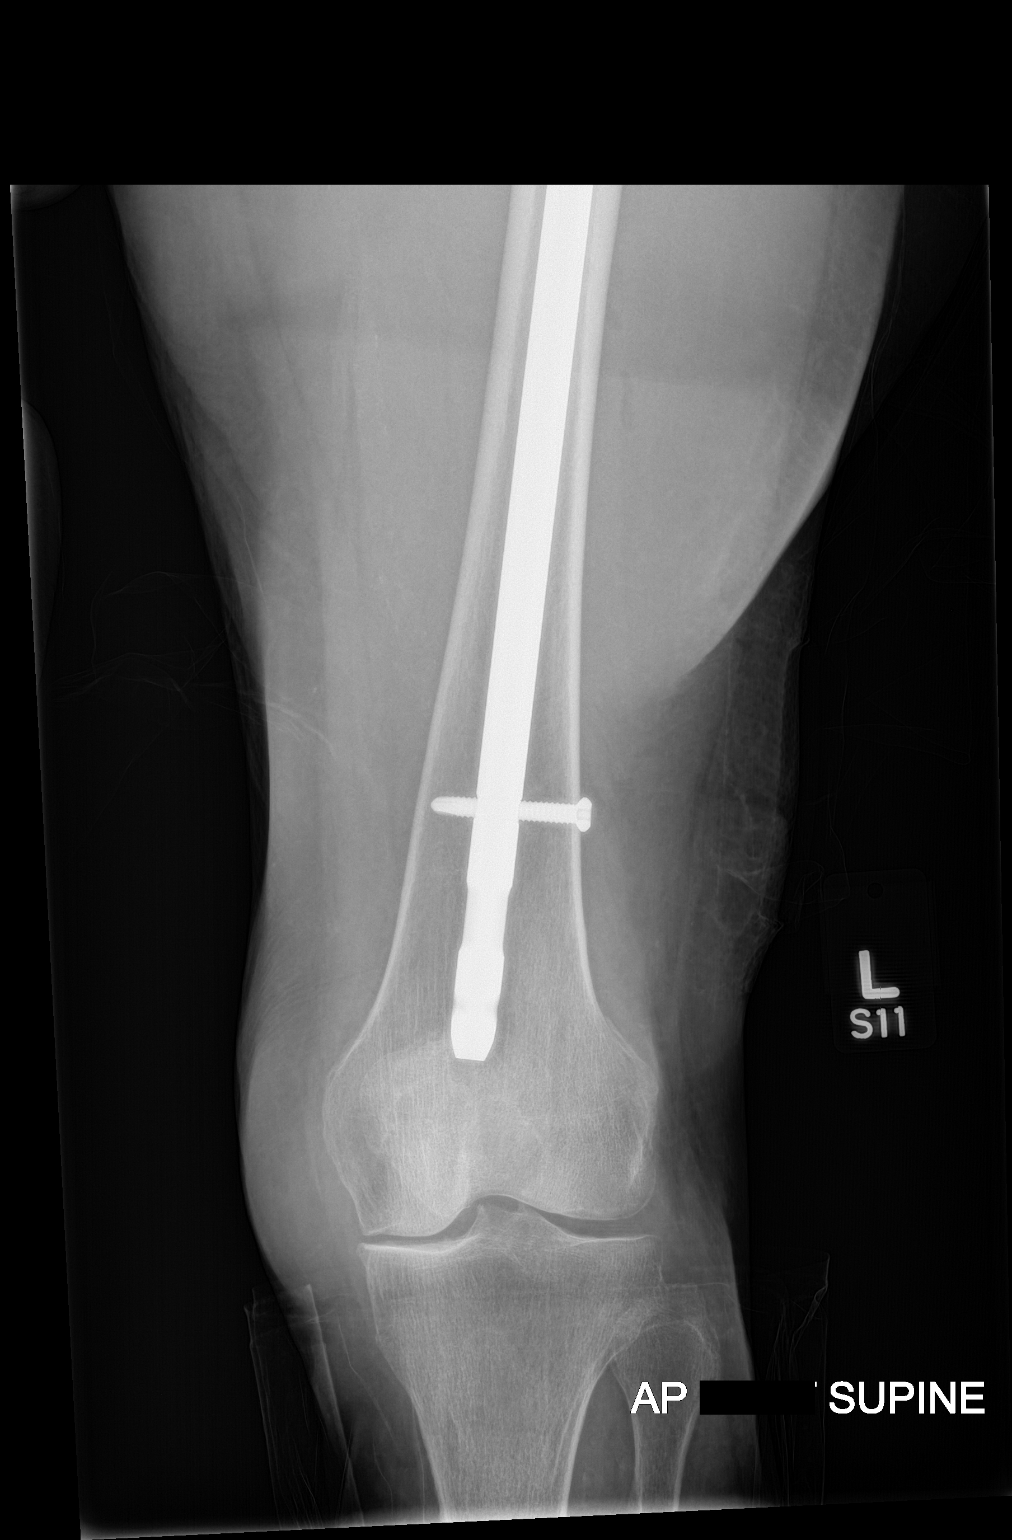

[4 of 4 positions shown; findings below may reference images not displayed]

FINDINGS: Interval placement of a left intra medullary nail interlocking
cannulated femoral neck screw transfixing a comminuted
intertrochanteric fracture. There is medial displacement of the
lesser trochanteric fracture fragment. There is 8 mm of lateral
displacement of the greater trochanteric fragment relative to the
subtrochanteric femur. There is 8 mm gap at the medial femoral neck
and intertrochanteric region. There are postsurgical changes in the
surrounding soft tissues.

There is medial femorotibial compartment joint space narrowing and
marginal osteophytosis.

There is a calcified pelvic mass most consistent with a uterine
fibroid.
IMPRESSION: Interval ORIF left intertrochanteric fracture as detailed above.

## 2017-03-25 DIAGNOSIS — E039 Hypothyroidism, unspecified: Secondary | ICD-10-CM | POA: Diagnosis not present

## 2017-03-25 DIAGNOSIS — M6281 Muscle weakness (generalized): Secondary | ICD-10-CM | POA: Diagnosis not present

## 2017-03-25 DIAGNOSIS — E559 Vitamin D deficiency, unspecified: Secondary | ICD-10-CM | POA: Diagnosis not present

## 2017-03-25 DIAGNOSIS — I10 Essential (primary) hypertension: Secondary | ICD-10-CM | POA: Diagnosis not present

## 2017-03-25 LAB — HEPATIC FUNCTION PANEL
ALK PHOS: 82 (ref 25–125)
ALT: 6 — AB (ref 7–35)
AST: 17 (ref 13–35)
Bilirubin, Total: 1.2

## 2017-03-25 LAB — BASIC METABOLIC PANEL
BUN: 21 (ref 4–21)
CREATININE: 0.7 (ref 0.5–1.1)
Glucose: 110
Potassium: 5.8 — AB (ref 3.4–5.3)
Sodium: 139 (ref 137–147)

## 2017-03-25 LAB — TSH: TSH: 1.13 (ref 0.41–5.90)

## 2017-03-25 LAB — CBC AND DIFFERENTIAL
HCT: 46 (ref 36–46)
Hemoglobin: 14.8 (ref 12.0–16.0)
PLATELETS: 132 — AB (ref 150–399)
WBC: 9.3

## 2017-03-25 LAB — VITAMIN D 25 HYDROXY (VIT D DEFICIENCY, FRACTURES): Vit D, 25-Hydroxy: >60

## 2017-03-26 ENCOUNTER — Non-Acute Institutional Stay (SKILLED_NURSING_FACILITY): Payer: Medicare Other | Admitting: Internal Medicine

## 2017-03-26 ENCOUNTER — Encounter: Payer: Self-pay | Admitting: Internal Medicine

## 2017-03-26 DIAGNOSIS — R29898 Other symptoms and signs involving the musculoskeletal system: Secondary | ICD-10-CM

## 2017-03-26 DIAGNOSIS — R41841 Cognitive communication deficit: Secondary | ICD-10-CM | POA: Diagnosis not present

## 2017-03-26 DIAGNOSIS — R319 Hematuria, unspecified: Secondary | ICD-10-CM | POA: Diagnosis not present

## 2017-03-26 DIAGNOSIS — N390 Urinary tract infection, site not specified: Secondary | ICD-10-CM | POA: Diagnosis not present

## 2017-03-26 DIAGNOSIS — Z9181 History of falling: Secondary | ICD-10-CM | POA: Diagnosis not present

## 2017-03-26 DIAGNOSIS — R5383 Other fatigue: Secondary | ICD-10-CM | POA: Diagnosis not present

## 2017-03-26 DIAGNOSIS — J449 Chronic obstructive pulmonary disease, unspecified: Secondary | ICD-10-CM | POA: Diagnosis not present

## 2017-03-26 NOTE — Progress Notes (Signed)
Location:  [facility] Room Number: 213W Place of Service:  SNF (31)  [name], MD  Patient Care Team: [name], MD as PCP - General (Internal Medicine)  Extended Emergency Contact Information Primary Emergency Contact: [name],[name] Address: [address]          [place], [place] [address] [name] of [place] Phone: [phone] Relation: Daughter    Allergies: Lyrica [pregabalin] and Penicillins  Chief Complaint  Patient presents with  . Acute Visit    HPI: Patient is 83 y.o. female who is being seen for report of pt not feeling well. Report came from room mates daughter. Pt herself admits he legs feel a little weak, but that is not new. No pain anywhere, SOB, cough or cold. No urinary sx. No reported fever. Pt just feels a little tired and feels like being in bed today.  Past Medical History:  Diagnosis Date  . Atrial fibrillation ([place])   . CHF (congestive heart failure) ([place])   . COPD (chronic obstructive pulmonary disease) ([place])   . Glaucoma   . Hypertension     Past Surgical History:  Procedure Laterality Date  . FEMUR IM NAIL Left 09/09/2015   Procedure: INTRAMEDULLARY (IM) NAIL FEMORAL;  Surgeon: Rod Can, MD;  Location: [facility];  Service: Orthopedics;  Laterality: Left;  . HERNIA REPAIR      Allergies as of 03/26/2017      Reactions   Lyrica [pregabalin] Other (See Comments)   Caused her to be lethargic and her legs felt as if she could not stand   Penicillins Rash      Medication List       Accurate as of 03/26/17 11:59 PM. Always use your most recent med list.          acetaminophen 325 MG tablet Commonly known as:  TYLENOL Take 650 mg by mouth every 4 (four) hours as needed for moderate pain.   aspirin 325 MG EC tablet Take 325 mg by mouth daily.   brimonidine 0.1 % Soln Commonly known as:  ALPHAGAN P Place 1 drop into both eyes 2 (two) times daily.   carboxymethylcellulose 1 %  ophthalmic solution Place 1 drop into both eyes 2 (two) times daily.   ENSURE PLUS Liqd Take 1 Can by mouth 2 (two) times daily between meals. Offer at bedtime and in between meals   gabapentin 300 MG capsule Commonly known as:  NEURONTIN Take 300 mg by mouth at bedtime.   guaiFENesin 600 MG 12 hr tablet Commonly known as:  MUCINEX Take 600 mg by mouth 2 (two) times daily.   HYDROcodone-acetaminophen 5-325 MG tablet Commonly known as:  NORCO/VICODIN Take 1 tablet by mouth every 4 (four) hours as needed for moderate pain or severe pain. No more than 3 grams of Tylenol in 24 hours   ICAPS AREDS 2 Caps Take 1 capsule by mouth 2 (two) times daily.   LASIX 40 MG tablet Generic drug:  furosemide Take 40 mg by mouth daily. Take 1 tab with 20 mg to equal 60 mg by mouth daily   furosemide 20 MG tablet Commonly known as:  LASIX Take 20 mg by mouth daily. Take 1 tab with 40 mg to equal 60 mg by mouth daily   latanoprost 0.005 % ophthalmic solution Commonly known as:  XALATAN Place 1 drop into both eyes at bedtime.   levothyroxine 75 MCG tablet Commonly known as:  SYNTHROID, LEVOTHROID Take 75  mcg by mouth daily before breakfast.   Melatonin 1 MG Tabs Take 1 mg by mouth at bedtime.   metoprolol succinate 50 MG 24 hr tablet Commonly known as:  TOPROL-XL Take 50 mg by mouth daily. Take with or immediately following a meal.   mirtazapine 15 MG tablet Commonly known as:  REMERON Take 15 mg by mouth at bedtime.   pantoprazole 40 MG tablet Commonly known as:  PROTONIX Take 40 mg by mouth daily.   polyethylene glycol packet Commonly known as:  MIRALAX / GLYCOLAX Take 17 g by mouth every other day.   potassium chloride SA 20 MEQ tablet Commonly known as:  K-DUR,KLOR-CON Take 20 mEq by mouth daily.   spironolactone 25 MG tablet Commonly known as:  ALDACTONE Take 12.5 mg by mouth daily.   traMADol 50 MG tablet Commonly known as:  ULTRAM Take 1 tablet (50 mg total) by mouth  at bedtime.   Vitamin D3 5000 units Caps Take 5,000 Units by mouth daily.       No orders of the defined types were placed in this encounter.   Immunization History  Administered Date(s) Administered  . Influenza, High Dose Seasonal PF 08/19/2014  . Influenza-Unspecified 08/25/2015, 08/16/2016  . PPD Test 10/31/2015, 11/17/2015  . Pneumococcal Conjugate-13 02/04/2015  . Pneumococcal-Unspecified 01/17/2009  . Tdap 07/20/2013    Social History  Substance Use Topics  . Smoking status: Never Smoker  . Smokeless tobacco: Never Used  . Alcohol use No    Review of Systems  DATA OBTAINED: from patient, nurse GENERAL:  no fevers,mild  fatigue, no appetite changes SKIN: No itching, rash HEENT: No complaint RESPIRATORY: No cough, wheezing, SOB CARDIAC: No chest pain, palpitations, lower extremity edema  GI: No abdominal pain, No N/V/D or constipation, No heartburn or reflux  GU: No dysuria, frequency or urgency, or incontinence  MUSCULOSKELETAL: No unrelieved bone/joint pain NEUROLOGIC: No headache, dizziness ; B leg weakness, not new PSYCHIATRIC: No overt anxiety or sadness  Vitals:   03/26/17 1157  BP: 130/71  Pulse: 80  Resp: 20  Temp: 97.8 F (36.6 C)   Body mass index is 23.88 kg/m. Physical Exam  GENERAL APPEARANCE: Alert, conversant, No acute distress  SKIN: No diaphoresis rash HEENT: Unremarkable RESPIRATORY: Breathing is even, unlabored. Lung sounds are clear   CARDIOVASCULAR: Heart RRR no murmurs, rubs or gallops. No peripheral edema  GASTROINTESTINAL: Abdomen is soft, non-tender, not distended w/ normal bowel sounds.  GENITOURINARY: Bladder non tender, not distended  MUSCULOSKELETAL: No abnormal joints or musculature NEUROLOGIC: Cranial nerves 2-12 grossly intact. Moves all extremities PSYCHIATRIC: Mood and affect with dementia, no change in baseline., no behavioral issues  Patient Active Problem List   Diagnosis Date Noted  . Hypothyroidism  02/02/2017  . Depression 07/05/2016  . GERD (gastroesophageal reflux disease) 06/10/2016  . Cerumen debris on tympanic membrane 03/14/2016  . PVD (peripheral vascular disease) ([place]) 01/24/2016  . Dementia without behavioral disturbance 01/01/2016  . Encounter for family conference with patient present 01/01/2016  . Aspiration of food 12/17/2015  . Speech abnormality 11/29/2015  . Senile purpura ([place]) 11/29/2015  . Hip pain, acute 11/06/2015  . Acute encephalopathy 11/06/2015  . Bilateral lower extremity edema 11/06/2015  . UTI (urinary tract infection) 11/06/2015  . Chronic respiratory failure with hypoxia ([place]) 11/06/2015  . Closed left hip fracture ([place]) 09/09/2015  . Chronic diastolic CHF (congestive heart failure) ([place]) 09/09/2015  . COPD (chronic obstructive pulmonary disease) ([place])   . Atrial fibrillation ([place])   .  Hypertensive heart disease with CHF (congestive heart failure) ([place])   . Chronic bronchitis ([place])   . Essential hypertension   . Fall   . Closed pertrochanteric fracture ([facility])     CMP     Component Value Date/Time   NA 141 03/27/2017   K 4.7 03/27/2017   CL 107 09/13/2015 0448   CO2 24 09/13/2015 0448   GLUCOSE 101 (H) 09/13/2015 0448   BUN 24 (A) 03/27/2017   CREATININE 0.9 03/27/2017   CREATININE 0.66 09/13/2015 0448   CALCIUM 8.7 (L) 09/13/2015 0448   PROT 6.9 09/08/2015 2325   ALBUMIN 4.1 09/08/2015 2325   AST 21 09/20/2016   ALT 8 09/20/2016   ALKPHOS 77 09/20/2016   BILITOT 1.3 (H) 09/08/2015 2325   GFRNONAA >60 09/13/2015 0448   GFRAA >60 09/13/2015 0448    Recent Labs  09/20/16 03/27/17  NA 141 141  K 4.0 4.7  BUN 20 24*  CREATININE 0.9 0.9    Recent Labs  09/20/16  AST 21  ALT 8  ALKPHOS 77    Recent Labs  09/20/16  WBC 4.7  HGB 13.7  HCT 42  PLT 143*    Recent Labs  09/20/16  CHOL 149  LDLCALC 69  TRIG 58   No results found for: [facility] Lab Results  Component Value Date   TSH 4.53 01/09/2017   Lab Results    Component Value Date   HGBA1C 5.1 09/20/2016   Lab Results  Component Value Date   CHOL 149 09/20/2016   HDL 68 09/20/2016   LDLCALC 69 09/20/2016   TRIG 58 09/20/2016    Significant Diagnostic Results in last 30 days:  No results found.  Assessment and Plan  FEELING TIRED/ BLE WEAKNESS - have ordered CXR, u/a, Vit D, TSH, CBC and BMP; my suspicion is low for serious but at 83 yo you can do anything you want to. Have set up fotr outpt neuro for lrgs.  Later - CXR - resolution of prior CHF with small B pleural effusions, not new, TSH 1.13, Vit D > 60, H/H 14.8/45,7, PLT 132, BUN/Cr 20.6/0.71, with K+ 5.8- Kayaxalate 15 gm po now and BMP in am have been ordered Have spoken with daughter     Time spent > 35 min;> 50% of time with patient was spent reviewing records, labs, tests and studies, counseling and developing plan of care  Noah Delaine. Sheppard Coil, MD

## 2017-03-27 DIAGNOSIS — I10 Essential (primary) hypertension: Secondary | ICD-10-CM | POA: Diagnosis not present

## 2017-03-27 DIAGNOSIS — R319 Hematuria, unspecified: Secondary | ICD-10-CM | POA: Diagnosis not present

## 2017-03-27 DIAGNOSIS — N390 Urinary tract infection, site not specified: Secondary | ICD-10-CM | POA: Diagnosis not present

## 2017-03-27 DIAGNOSIS — D649 Anemia, unspecified: Secondary | ICD-10-CM | POA: Diagnosis not present

## 2017-03-27 LAB — BASIC METABOLIC PANEL
BUN: 24 mg/dL — AB (ref 4–21)
CREATININE: 0.9 mg/dL (ref 0.5–1.1)
Glucose: 80 mg/dL
Potassium: 4.7 mmol/L (ref 3.4–5.3)
SODIUM: 141 mmol/L (ref 137–147)

## 2017-03-29 DIAGNOSIS — Z9181 History of falling: Secondary | ICD-10-CM | POA: Diagnosis not present

## 2017-03-29 DIAGNOSIS — J449 Chronic obstructive pulmonary disease, unspecified: Secondary | ICD-10-CM | POA: Diagnosis not present

## 2017-03-29 DIAGNOSIS — R41841 Cognitive communication deficit: Secondary | ICD-10-CM | POA: Diagnosis not present

## 2017-03-30 ENCOUNTER — Other Ambulatory Visit: Payer: Self-pay | Admitting: Internal Medicine

## 2017-03-31 ENCOUNTER — Encounter: Payer: Self-pay | Admitting: Internal Medicine

## 2017-03-31 NOTE — Assessment & Plan Note (Signed)
Controlled ;plan to cont aldactone 12.5 mg daily, lasix 60 mg daily and torpol XL 50 mg daily

## 2017-03-31 NOTE — Assessment & Plan Note (Signed)
Waxes ans wanes but lately stable; plan top cont lasix daily, aldactone 12.5 ng daily, and encourage leg elevation

## 2017-03-31 NOTE — Assessment & Plan Note (Signed)
No reported exacerbations or weight gains;plan to cont lasix 60 mg daily, aldactone 12.5 mg daily and toprolXl 50 mg daily

## 2017-04-03 DIAGNOSIS — G4700 Insomnia, unspecified: Secondary | ICD-10-CM | POA: Diagnosis not present

## 2017-04-03 DIAGNOSIS — F0390 Unspecified dementia without behavioral disturbance: Secondary | ICD-10-CM | POA: Diagnosis not present

## 2017-04-03 DIAGNOSIS — F331 Major depressive disorder, recurrent, moderate: Secondary | ICD-10-CM | POA: Diagnosis not present

## 2017-04-23 DIAGNOSIS — R1314 Dysphagia, pharyngoesophageal phase: Secondary | ICD-10-CM | POA: Diagnosis not present

## 2017-04-23 DIAGNOSIS — D692 Other nonthrombocytopenic purpura: Secondary | ICD-10-CM | POA: Diagnosis not present

## 2017-04-23 DIAGNOSIS — J449 Chronic obstructive pulmonary disease, unspecified: Secondary | ICD-10-CM | POA: Diagnosis not present

## 2017-04-23 DIAGNOSIS — M6281 Muscle weakness (generalized): Secondary | ICD-10-CM | POA: Diagnosis not present

## 2017-04-25 ENCOUNTER — Encounter: Payer: Self-pay | Admitting: Internal Medicine

## 2017-04-25 ENCOUNTER — Non-Acute Institutional Stay (SKILLED_NURSING_FACILITY): Payer: Medicare Other | Admitting: Internal Medicine

## 2017-04-25 DIAGNOSIS — I4891 Unspecified atrial fibrillation: Secondary | ICD-10-CM | POA: Diagnosis not present

## 2017-04-25 DIAGNOSIS — I739 Peripheral vascular disease, unspecified: Secondary | ICD-10-CM

## 2017-04-25 DIAGNOSIS — K219 Gastro-esophageal reflux disease without esophagitis: Secondary | ICD-10-CM

## 2017-04-25 NOTE — Progress Notes (Signed)
Location:  [name] Room Number: [id number] Place of Service:  SNF (31)  [name], MD  Patient Care Team: [name], MD as PCP - General (Internal Medicine)  Extended Emergency Contact Information Primary Emergency Contact: [name],[name] Address: [address]          [place], [place] [address] [name] of [place] Phone: [phone] Relation: Daughter    Allergies: Lyrica [pregabalin] Mariah Penicillins  Chief Complaint  Patient presents with  . Medical Management of Chronic Issues    Routine Visit    HPI: Patient is 83 y.o. female who Is being seen for routine issues of [name], Mariah [name], Mariah GERD.  Past Medical History:  Diagnosis Date  . Acute encephalopathy 11/06/2015  . Atrial fibrillation ([place])   . CHF (congestive heart failure) ([place])   . Chronic diastolic CHF (congestive heart failure) ([place]) 09/09/2015  . Chronic respiratory failure with hypoxia ([place]) 11/06/2015  . Closed left hip fracture ([place]) 09/09/2015  . Closed pertrochanteric fracture ([place])   . COPD (chronic obstructive pulmonary disease) ([place])   . Dementia without behavioral disturbance 01/01/2016  . GERD (gastroesophageal reflux disease) 06/10/2016  . Glaucoma   . Hypertension   . Hypothyroidism 02/02/2017  . [name] (peripheral vascular disease) ([place]) 01/24/2016  . Speech abnormality 11/29/2015    Past Surgical History:  Procedure Laterality Date  . FEMUR IM NAIL Left 09/09/2015   Procedure: INTRAMEDULLARY (IM) NAIL FEMORAL;  Surgeon: Rod Can, MD;  Location: [facility];  Service: Orthopedics;  Laterality: Left;  . HERNIA REPAIR      Allergies as of 04/25/2017      Reactions   Lyrica [pregabalin] Other (See Comments)   Caused her to be lethargic Mariah her legs felt as if she could not stand   Penicillins Rash      Medication List       Accurate as of 04/25/17 11:59 PM. Always use your most recent med list.          acetaminophen 325 MG tablet Commonly  known as:  TYLENOL Take 650 mg by mouth every 4 (four) hours as needed for moderate pain.   aspirin 325 MG EC tablet Take 325 mg by mouth daily.   brimonidine 0.1 % Soln Commonly known as:  ALPHAGAN P Place 1 drop into both eyes 2 (two) times daily.   carboxymethylcellulose 1 % ophthalmic solution Place 1 drop into both eyes 2 (two) times daily.   ENSURE PLUS Liqd Take 1 Can by mouth 2 (two) times daily between meals. Offer at bedtime Mariah in between meals   gabapentin 300 MG capsule Commonly known as:  NEURONTIN Take 300 mg by mouth at bedtime.   guaiFENesin 600 MG 12 hr tablet Commonly known as:  MUCINEX Take 600 mg by mouth 2 (two) times daily.   HYDROcodone-acetaminophen 5-325 MG tablet Commonly known as:  NORCO/VICODIN Take 1 tablet by mouth every 4 (four) hours as needed for moderate pain or severe pain. No more than 3 grams of Tylenol in 24 hours   ICAPS AREDS 2 Caps Take 1 capsule by mouth 2 (two) times daily.   LASIX 40 MG tablet Generic drug:  furosemide Take 40 mg by mouth daily. Take 1 tab with 20 mg to equal 60 mg by mouth daily   furosemide 20 MG tablet Commonly known as:  LASIX Take 20 mg by mouth daily. Take 1 tab with 40 mg to equal 60 mg by mouth daily  latanoprost 0.005 % ophthalmic solution Commonly known as:  XALATAN Place 1 drop into both eyes at bedtime.   levothyroxine 75 MCG tablet Commonly known as:  SYNTHROID, LEVOTHROID Take 75 mcg by mouth daily before breakfast.   Melatonin 1 MG Tabs Take 1 mg by mouth at bedtime.   metoprolol succinate 50 MG 24 hr tablet Commonly known as:  TOPROL-XL Take 50 mg by mouth daily. Take with or immediately following a meal.   mirtazapine 15 MG tablet Commonly known as:  REMERON Take 15 mg by mouth at bedtime.   pantoprazole 40 MG tablet Commonly known as:  PROTONIX Take 40 mg by mouth daily.   polyethylene glycol packet Commonly known as:  MIRALAX / GLYCOLAX Take 17 g by mouth every other  day.   potassium chloride SA 20 MEQ tablet Commonly known as:  K-DUR,KLOR-CON Take 20 mEq by mouth daily.   spironolactone 25 MG tablet Commonly known as:  ALDACTONE Take 12.5 mg by mouth daily.   traMADol 50 MG tablet Commonly known as:  ULTRAM Take 1 tablet (50 mg total) by mouth at bedtime.   Vitamin D3 5000 units Caps Take 5,000 Units by mouth daily.       No orders of the defined types were placed in this encounter.   Immunization History  Administered Date(s) Administered  . Influenza, High Dose Seasonal PF 08/19/2014  . Influenza-Unspecified 08/25/2015, 08/16/2016  . PPD Test 10/31/2015, 11/17/2015  . Pneumococcal Conjugate-13 02/04/2015  . Pneumococcal Polysaccharide-23 01/17/2009  . Pneumococcal-Unspecified 01/17/2009  . Tdap 07/20/2013    Social History  Substance Use Topics  . Smoking status: Never Smoker  . Smokeless tobacco: Never Used  . Alcohol use No    Review of Systems  DATA OBTAINED: from patient, nurse GENERAL:  no fevers, fatigue, appetite changes SKIN: No itching, rash HEENT: No complaint RESPIRATORY: No cough, wheezing, SOB CARDIAC: No chest pain, palpitations, lower extremity edema  GI: No abdominal pain, No N/V/D or constipation, No heartburn or reflux  GU: No dysuria, frequency or urgency, or incontinence  MUSCULOSKELETAL: No unrelieved bone/joint pain NEUROLOGIC: No headache, dizziness  PSYCHIATRIC: No overt anxiety or sadness  Vitals:   04/25/17 1005  BP: 130/71  Pulse: 96  Resp: 18  Temp: 97.9 F (36.6 C)   Body mass index is 23.51 kg/m. Physical Exam  GENERAL APPEARANCE: Alert, conversant, No acute distress; Patient with very good these days, always up out of her room always stressed hair always done  SKIN: No diaphoresis rash HEENT: Unremarkable RESPIRATORY: Breathing is even, unlabored. Lung sounds are clear   CARDIOVASCULAR: Heart RRR no murmurs, rubs or gallops. No peripheral edema  GASTROINTESTINAL: Abdomen is  soft, non-tender, not distended w/ normal bowel sounds.  GENITOURINARY: Bladder non tender, not distended  MUSCULOSKELETAL: No abnormal joints or musculature NEUROLOGIC: Cranial nerves 2-12 grossly intact. Moves all extremities PSYCHIATRIC: Mood Mariah affect appropriate to situation with dementia, no behavioral issues  Patient Active Problem List   Diagnosis Date Noted  . Hypothyroidism 02/02/2017  . Depression 07/05/2016  . GERD (gastroesophageal reflux disease) 06/10/2016  . Cerumen debris on tympanic membrane 03/14/2016  . [name] (peripheral vascular disease) ([place]) 01/24/2016  . Dementia without behavioral disturbance 01/01/2016  . Encounter for family conference with patient present 01/01/2016  . Aspiration of food 12/17/2015  . Speech abnormality 11/29/2015  . Senile purpura ([place]) 11/29/2015  . Hip pain, acute 11/06/2015  . Acute encephalopathy 11/06/2015  . Bilateral lower extremity edema 11/06/2015  . UTI (urinary  tract infection) 11/06/2015  . Chronic respiratory failure with hypoxia ([place]) 11/06/2015  . Closed left hip fracture ([place]) 09/09/2015  . Chronic diastolic CHF (congestive heart failure) ([place]) 09/09/2015  . COPD (chronic obstructive pulmonary disease) ([place])   . Atrial fibrillation ([place])   . Hypertensive heart disease with CHF (congestive heart failure) ([place])   . Chronic bronchitis ([place])   . Essential hypertension   . Fall   . Closed pertrochanteric fracture ([facility])     CMP     Component Value Date/Time   NA 141 03/27/2017   K 4.7 03/27/2017   CL 107 09/13/2015 0448   CO2 24 09/13/2015 0448   GLUCOSE 101 (H) 09/13/2015 0448   BUN 24 (A) 03/27/2017   CREATININE 0.9 03/27/2017   CREATININE 0.66 09/13/2015 0448   CALCIUM 8.7 (L) 09/13/2015 0448   PROT 6.9 09/08/2015 2325   ALBUMIN 4.1 09/08/2015 2325   AST 17 03/25/2017   ALT 6 (A) 03/25/2017   ALKPHOS 82 03/25/2017   BILITOT 1.3 (H) 09/08/2015 2325   GFRNONAA >60 09/13/2015 0448   GFRAA >60 09/13/2015 0448     Recent Labs  09/20/16 03/25/17 03/27/17  NA 141 139 141  K 4.0 5.8* 4.7  BUN 20 21 24*  CREATININE 0.9 0.7 0.9    Recent Labs  09/20/16 03/25/17  AST 21 17  ALT 8 6*  ALKPHOS 77 82    Recent Labs  09/20/16 03/25/17  WBC 4.7 9.3  HGB 13.7 14.8  HCT 42 46  PLT 143* 132*    Recent Labs  09/20/16  CHOL 149  LDLCALC 69  TRIG 58   No results found for: [facility] Lab Results  Component Value Date   TSH 1.13 03/25/2017   Lab Results  Component Value Date   HGBA1C 5.1 09/20/2016   Lab Results  Component Value Date   CHOL 149 09/20/2016   HDL 68 09/20/2016   LDLCALC 69 09/20/2016   TRIG 58 09/20/2016    Significant Diagnostic Results in last 30 days:  No results found.  Assessment Mariah Plan  [name] (peripheral vascular disease) ([place]) No reported wounds or skin breakdowns, plan to continue ASA 325 mg by mouth daily  Atrial fibrillation ([facility]) Stable; rate is controlled with Toprol-XL 50 mg by mouth daily Mariah patient is prophylaxed with ASA 325 mg by mouth daily  GERD (gastroesophageal reflux disease) No reported episodes of aspiration or reflux; plan to continue Protonix 40 mg by mouth daily    Anne D. Sheppard Coil, MD

## 2017-05-06 DIAGNOSIS — F331 Major depressive disorder, recurrent, moderate: Secondary | ICD-10-CM | POA: Diagnosis not present

## 2017-05-06 DIAGNOSIS — G4700 Insomnia, unspecified: Secondary | ICD-10-CM | POA: Diagnosis not present

## 2017-05-06 DIAGNOSIS — F0390 Unspecified dementia without behavioral disturbance: Secondary | ICD-10-CM | POA: Diagnosis not present

## 2017-05-11 DIAGNOSIS — R1314 Dysphagia, pharyngoesophageal phase: Secondary | ICD-10-CM | POA: Diagnosis not present

## 2017-05-11 DIAGNOSIS — D692 Other nonthrombocytopenic purpura: Secondary | ICD-10-CM | POA: Diagnosis not present

## 2017-05-11 DIAGNOSIS — M6281 Muscle weakness (generalized): Secondary | ICD-10-CM | POA: Diagnosis not present

## 2017-05-11 DIAGNOSIS — J449 Chronic obstructive pulmonary disease, unspecified: Secondary | ICD-10-CM | POA: Diagnosis not present

## 2017-05-17 ENCOUNTER — Non-Acute Institutional Stay (SKILLED_NURSING_FACILITY): Payer: Medicare Other

## 2017-05-17 DIAGNOSIS — Z0000 Encounter for general adult medical examination without abnormal findings: Secondary | ICD-10-CM | POA: Diagnosis not present

## 2017-05-17 NOTE — Progress Notes (Signed)
Subjective:   Mariah [name] is a 83 y.o. female who presents for an Initial Medicare Annual Wellness Visit at [facility]       Objective:    Today's Vitals   05/17/17 1443  BP: 120/70  Pulse: 86  Temp: 97.7 F (36.5 C)  TempSrc: Oral  SpO2: 94%  Weight: 124 lb (56.2 kg)  Height: 5\' 1"  (1.549 m)   Body mass index is 23.43 kg/m.   Current Medications (verified) Outpatient Encounter Prescriptions as of 05/17/2017  Medication Sig  . acetaminophen (TYLENOL) 325 MG tablet Take 650 mg by mouth every 4 (four) hours as needed for moderate pain.   Marland Kitchen aspirin 325 MG EC tablet Take 325 mg by mouth daily.  . brimonidine (ALPHAGAN P) 0.1 % SOLN Place 1 drop into both eyes 2 (two) times daily.  . carboxymethylcellulose 1 % ophthalmic solution Place 1 drop into both eyes 2 (two) times daily.  . Cholecalciferol (VITAMIN D3) 5000 UNITS CAPS Take 5,000 Units by mouth daily.   Marland Kitchen ENSURE PLUS (ENSURE PLUS) LIQD Take 1 Can by mouth 2 (two) times daily between meals. Offer at bedtime and in between meals  . furosemide (LASIX) 20 MG tablet Take 20 mg by mouth daily. Take 1 tab with 40 mg to equal 60 mg by mouth daily  . furosemide (LASIX) 40 MG tablet Take 40 mg by mouth daily. Take 1 tab with 20 mg to equal 60 mg by mouth daily  . gabapentin (NEURONTIN) 300 MG capsule Take 300 mg by mouth at bedtime.  Marland Kitchen guaiFENesin (MUCINEX) 600 MG 12 hr tablet Take 600 mg by mouth 2 (two) times daily.  Marland Kitchen HYDROcodone-acetaminophen (NORCO/VICODIN) 5-325 MG tablet Take 1 tablet by mouth every 4 (four) hours as needed for moderate pain or severe pain. No more than 3 grams of Tylenol in 24 hours  . latanoprost (XALATAN) 0.005 % ophthalmic solution Place 1 drop into both eyes at bedtime.  Marland Kitchen levothyroxine (SYNTHROID, LEVOTHROID) 75 MCG tablet Take 75 mcg by mouth daily before breakfast.  . Melatonin 1 MG TABS Take 1 mg by mouth at bedtime.   . metoprolol succinate (TOPROL-XL) 50 MG 24 hr tablet Take 50 mg by  mouth daily. Take with or immediately following a meal.  . mirtazapine (REMERON) 15 MG tablet Take 15 mg by mouth at bedtime.  . Multiple Vitamins-Minerals (ICAPS AREDS 2) CAPS Take 1 capsule by mouth 2 (two) times daily.  . pantoprazole (PROTONIX) 40 MG tablet Take 40 mg by mouth daily.  . polyethylene glycol (MIRALAX / GLYCOLAX) packet Take 17 g by mouth every other day.  . potassium chloride SA (K-DUR,KLOR-CON) 20 MEQ tablet Take 20 mEq by mouth daily.  Marland Kitchen spironolactone (ALDACTONE) 25 MG tablet Take 12.5 mg by mouth daily.  . traMADol (ULTRAM) 50 MG tablet Take 1 tablet (50 mg total) by mouth at bedtime.   No facility-administered encounter medications on file as of 05/17/2017.     Allergies (verified) Lyrica [pregabalin] and Penicillins   History: Past Medical History:  Diagnosis Date  . Atrial fibrillation ([place])   . CHF (congestive heart failure) ([place])   . COPD (chronic obstructive pulmonary disease) ([place])   . Glaucoma   . Hypertension    Past Surgical History:  Procedure Laterality Date  . FEMUR IM NAIL Left 09/09/2015   Procedure: INTRAMEDULLARY (IM) NAIL FEMORAL;  Surgeon: Rod Can, MD;  Location: [facility];  Service: Orthopedics;  Laterality: Left;  . HERNIA REPAIR  Family History  Problem Relation Age of Onset  . Cancer Father        Patient does not remember which type of cancer  . Hyperlipidemia Father   . Stroke Sister   . Prostate cancer Brother   . Diabetes Daughter    Social History   Occupational History  . Not on file.   Social History Main Topics  . Smoking status: Never Smoker  . Smokeless tobacco: Never Used  . Alcohol use No  . Drug use: No  . Sexual activity: Not on file    Tobacco Counseling Counseling given: Not Answered   Activities of Daily Living In your present state of health, do you have any difficulty performing the following activities: 05/17/2017  Hearing? N  Vision? Y  Difficulty concentrating or making decisions? N    Walking or climbing stairs? Y  Dressing or bathing? Y  Doing errands, shopping? Y  Preparing Food and eating ? Y  Using the Toilet? Y  In the past six months, have you accidently leaked urine? Y  Do you have problems with loss of bowel control? Y  Managing your Medications? Y  Managing your Finances? Y  Housekeeping or managing your Housekeeping? Y  Some recent data might be hidden    Immunizations and Health Maintenance Immunization History  Administered Date(s) Administered  . Influenza, High Dose Seasonal PF 08/19/2014  . Influenza-Unspecified 08/25/2015, 08/16/2016  . PPD Test 10/31/2015, 11/17/2015  . Pneumococcal Conjugate-13 02/04/2015  . Pneumococcal Polysaccharide-23 01/17/2009  . Pneumococcal-Unspecified 01/17/2009  . Tdap 07/20/2013   There are no preventive care reminders to display for this patient.  Patient Care Team: [name], MD as PCP - General (Internal Medicine)  Indicate any recent Medical Services you may have received from other than [facility] providers in the past year (date may be approximate).     Assessment:   This is a routine wellness examination for [name].   Hearing/Vision screen No exam data present  Dietary issues and exercise activities discussed: Current Exercise Habits: The patient does not participate in regular exercise at present, Exercise limited by: orthopedic condition(s)  Goals    None     Depression Screen PHQ 2/9 Scores 05/17/2017  PHQ - 2 Score 0    Fall Risk Fall Risk  05/17/2017  Falls in the past year? Yes  Number falls in past yr: 1  Injury with Fall? Yes    Cognitive Function:     6CIT Screen 05/17/2017  What Year? 0 points  What month? 0 points  What time? 0 points  Count back from 20 0 points  Months in reverse 2 points  Repeat phrase 6 points  Total Score 8    Screening Tests Health Maintenance  Topic Date Due  . DEXA SCAN  11/20/2023 (Originally [birth year]1986)  . INFLUENZA VACCINE  06/19/2017   . TETANUS/TDAP  07/21/2023  . PNA vac Low Risk Adult  Completed      Plan:    I have personally reviewed and addressed the Medicare Annual Wellness questionnaire and have noted the following in the patient's chart:  A. Medical and social history B. Use of alcohol, tobacco or illicit drugs  C. Current medications and supplements D. Functional ability and status E.  Nutritional status F.  Physical activity G. Advance directives H. List of other physicians I.  Hospitalizations, surgeries, and ER visits in previous 12 months J.  [place] to include hearing, vision, cognitive, depression L. Referrals and appointments -  none  In addition, I have reviewed and discussed with patient certain preventive protocols, quality metrics, and best practice recommendations. A written personalized care plan for preventive services as well as general preventive health recommendations were provided to patient.  See attached scanned questionnaire for additional information.   Signed,   Rich Reining, RN Nurse Health Advisor   Quick Notes   Health Maintenance: DEXA due     Abnormal Screen: 6 CIT-8     Patient Concerns: Pt stated she went to the dentist yesterday and is trying to decide if she wants to get her cracked tooth fixed.     Nurse Concerns: None

## 2017-05-17 NOTE — Patient Instructions (Signed)
Mariah [name] , Thank you for taking time to come for your Medicare Wellness Visit. I appreciate your ongoing commitment to your health goals. Please review the following plan we discussed and let me know if I can assist you in the future.   Screening recommendations/referrals: Colonoscopy up to date, LT patient Mammogram up to date, LT patient Bone Density due Recommended yearly ophthalmology/optometry visit for glaucoma screening and checkup Recommended yearly dental visit for hygiene and checkup  Vaccinations: Influenza vaccine up to date. Due 08/16/17 Pneumococcal vaccine up to date Tdap vaccine up to date. Due 07/21/23 Shingles vaccine not in records  Advanced directives: DNR in chart, need rest for chart  Conditions/risks identified: None  Next appointment: Dr. Sheppard Coil makes rounds   Preventive Care 81 Years and Older, Female Preventive care refers to lifestyle choices and visits with your health care provider that can promote health and wellness. What does preventive care include?  A yearly physical exam. This is also called an annual well check.  Dental exams once or twice a year.  Routine eye exams. Ask your health care provider how often you should have your eyes checked.  Personal lifestyle choices, including:  Daily care of your teeth and gums.  Regular physical activity.  Eating a healthy diet.  Avoiding tobacco and drug use.  Limiting alcohol use.  Practicing safe sex.  Taking low-dose aspirin every day.  Taking vitamin and mineral supplements as recommended by your health care provider. What happens during an annual well check? The services and screenings done by your health care provider during your annual well check will depend on your age, overall health, lifestyle risk factors, and family history of disease. Counseling  Your health care provider may ask you questions about your:  Alcohol use.  Tobacco use.  Drug use.  Emotional  well-being.  Home and relationship well-being.  Sexual activity.  Eating habits.  History of falls.  Memory and ability to understand (cognition).  Work and work Statistician.  Reproductive health. Screening  You may have the following tests or measurements:  Height, weight, and BMI.  Blood pressure.  Lipid and cholesterol levels. These may be checked every 5 years, or more frequently if you are over 54 years old.  Skin check.  Lung cancer screening. You may have this screening every year starting at age 55 if you have a 30-pack-year history of smoking and currently smoke or have quit within the past 15 years.  Fecal occult blood test (FOBT) of the stool. You may have this test every year starting at age 28.  Flexible sigmoidoscopy or colonoscopy. You may have a sigmoidoscopy every 5 years or a colonoscopy every 10 years starting at age 65.  Hepatitis C blood test.  Hepatitis B blood test.  Sexually transmitted disease (STD) testing.  Diabetes screening. This is done by checking your blood sugar (glucose) after you have not eaten for a while (fasting). You may have this done every 1-3 years.  Bone density scan. This is done to screen for osteoporosis. You may have this done starting at age 31.  Mammogram. This may be done every 1-2 years. Talk to your health care provider about how often you should have regular mammograms. Talk with your health care provider about your test results, treatment options, and if necessary, the need for more tests. Vaccines  Your health care provider may recommend certain vaccines, such as:  Influenza vaccine. This is recommended every year.  Tetanus, diphtheria, and acellular pertussis (Tdap, Td)  vaccine. You may need a Td booster every 10 years.  Zoster vaccine. You may need this after age 42.  Pneumococcal 13-valent conjugate (PCV13) vaccine. One dose is recommended after age 4.  Pneumococcal polysaccharide (PPSV23) vaccine. One  dose is recommended after age 2. Talk to your health care provider about which screenings and vaccines you need and how often you need them. This information is not intended to replace advice given to you by your health care provider. Make sure you discuss any questions you have with your health care provider. Document Released: 12/02/2015 Document Revised: 07/25/2016 Document Reviewed: 09/06/2015 Elsevier Interactive Patient Education  2017 [place] Prevention in the Home Falls can cause injuries. They can happen to people of all ages. There are many things you can do to make your home safe and to help prevent falls. What can I do on the outside of my home?  Regularly fix the edges of walkways and driveways and fix any cracks.  Remove anything that might make you trip as you walk through a door, such as a raised step or threshold.  Trim any bushes or trees on the path to your home.  Use bright outdoor lighting.  Clear any walking paths of anything that might make someone trip, such as rocks or tools.  Regularly check to see if handrails are loose or broken. Make sure that both sides of any steps have handrails.  Any raised decks and porches should have guardrails on the edges.  Have any leaves, snow, or ice cleared regularly.  Use sand or salt on walking paths during winter.  Clean up any spills in your garage right away. This includes oil or grease spills. What can I do in the bathroom?  Use night lights.  Install grab bars by the toilet and in the tub and shower. Do not use towel bars as grab bars.  Use non-skid mats or decals in the tub or shower.  If you need to sit down in the shower, use a plastic, non-slip stool.  Keep the floor dry. Clean up any water that spills on the floor as soon as it happens.  Remove soap buildup in the tub or shower regularly.  Attach bath mats securely with double-sided non-slip rug tape.  Do not have throw rugs and other  things on the floor that can make you trip. What can I do in the bedroom?  Use night lights.  Make sure that you have a light by your bed that is easy to reach.  Do not use any sheets or blankets that are too big for your bed. They should not hang down onto the floor.  Have a firm chair that has side arms. You can use this for support while you get dressed.  Do not have throw rugs and other things on the floor that can make you trip. What can I do in the kitchen?  Clean up any spills right away.  Avoid walking on wet floors.  Keep items that you use a lot in easy-to-reach places.  If you need to reach something above you, use a strong step stool that has a grab bar.  Keep electrical cords out of the way.  Do not use floor polish or wax that makes floors slippery. If you must use wax, use non-skid floor wax.  Do not have throw rugs and other things on the floor that can make you trip. What can I do with my stairs?  Do not leave  any items on the stairs.  Make sure that there are handrails on both sides of the stairs and use them. Fix handrails that are broken or loose. Make sure that handrails are as long as the stairways.  Check any carpeting to make sure that it is firmly attached to the stairs. Fix any carpet that is loose or worn.  Avoid having throw rugs at the top or bottom of the stairs. If you do have throw rugs, attach them to the floor with carpet tape.  Make sure that you have a light switch at the top of the stairs and the bottom of the stairs. If you do not have them, ask someone to add them for you. What else can I do to help prevent falls?  Wear shoes that:  Do not have high heels.  Have rubber bottoms.  Are comfortable and fit you well.  Are closed at the toe. Do not wear sandals.  If you use a stepladder:  Make sure that it is fully opened. Do not climb a closed stepladder.  Make sure that both sides of the stepladder are locked into place.  Ask  someone to hold it for you, if possible.  Clearly mark and make sure that you can see:  Any grab bars or handrails.  First and last steps.  Where the edge of each step is.  Use tools that help you move around (mobility aids) if they are needed. These include:  Canes.  Walkers.  Scooters.  Crutches.  Turn on the lights when you go into a dark area. Replace any light bulbs as soon as they burn out.  Set up your furniture so you have a clear path. Avoid moving your furniture around.  If any of your floors are uneven, fix them.  If there are any pets around you, be aware of where they are.  Review your medicines with your doctor. Some medicines can make you feel dizzy. This can increase your chance of falling. Ask your doctor what other things that you can do to help prevent falls. This information is not intended to replace advice given to you by your health care provider. Make sure you discuss any questions you have with your health care provider. Document Released: 09/01/2009 Document Revised: 04/12/2016 Document Reviewed: 12/10/2014 Elsevier Interactive Patient Education  2017 [employer].

## 2017-05-29 ENCOUNTER — Non-Acute Institutional Stay (SKILLED_NURSING_FACILITY): Payer: Medicare Other | Admitting: Internal Medicine

## 2017-05-29 ENCOUNTER — Encounter: Payer: Self-pay | Admitting: Internal Medicine

## 2017-05-29 DIAGNOSIS — E034 Atrophy of thyroid (acquired): Secondary | ICD-10-CM | POA: Diagnosis not present

## 2017-05-29 DIAGNOSIS — F0280 Dementia in other diseases classified elsewhere without behavioral disturbance: Secondary | ICD-10-CM | POA: Diagnosis not present

## 2017-05-29 DIAGNOSIS — F32A Depression, unspecified: Secondary | ICD-10-CM

## 2017-05-29 DIAGNOSIS — G301 Alzheimer's disease with late onset: Secondary | ICD-10-CM | POA: Diagnosis not present

## 2017-05-29 DIAGNOSIS — F329 Major depressive disorder, single episode, unspecified: Secondary | ICD-10-CM | POA: Diagnosis not present

## 2017-05-29 NOTE — Progress Notes (Signed)
Location:  [facility] Room Number: 213W Place of Service:  SNF (31)  [name], MD  Patient Care Team: [name], MD as PCP - General (Internal Medicine)  Extended Emergency Contact Information Primary Emergency Contact: [name],[name] Address: [address]          [place],  [address] [name] of [place] Phone: [phone] Relation: Daughter    Allergies: Lyrica [pregabalin] and Penicillins  Chief Complaint  Patient presents with  . Medical Management of Chronic Issues    routine visit    HPI: Patient is 83 y.o. female who Is seen for routine issues of depression, dementia, and hypothyroidism.  Past Medical History:  Diagnosis Date  . Acute encephalopathy 11/06/2015  . Atrial fibrillation ([place])   . CHF (congestive heart failure) ([place])   . Chronic diastolic CHF (congestive heart failure) ([place]) 09/09/2015  . Chronic respiratory failure with hypoxia ([place]) 11/06/2015  . Closed left hip fracture ([place]) 09/09/2015  . Closed pertrochanteric fracture ([place])   . COPD (chronic obstructive pulmonary disease) ([place])   . Dementia without behavioral disturbance 01/01/2016  . GERD (gastroesophageal reflux disease) 06/10/2016  . Glaucoma   . Hypertension   . Hypothyroidism 02/02/2017  . PVD (peripheral vascular disease) ([place]) 01/24/2016  . Speech abnormality 11/29/2015    Past Surgical History:  Procedure Laterality Date  . FEMUR IM NAIL Left 09/09/2015   Procedure: INTRAMEDULLARY (IM) NAIL FEMORAL;  Surgeon: Rod Can, MD;  Location: [facility];  Service: Orthopedics;  Laterality: Left;  . HERNIA REPAIR      Allergies as of 05/29/2017      Reactions   Lyrica [pregabalin] Other (See Comments)   Caused her to be lethargic and her legs felt as if she could not stand   Penicillins Rash      Medication List       Accurate as of 05/29/17 11:59 PM. Always use your most recent med list.          acetaminophen 325 MG  tablet Commonly known as:  TYLENOL Take 650 mg by mouth every 4 (four) hours as needed for moderate pain.   aspirin 325 MG EC tablet Take 325 mg by mouth daily.   brimonidine 0.1 % Soln Commonly known as:  ALPHAGAN P Place 1 drop into both eyes 2 (two) times daily.   carboxymethylcellulose 1 % ophthalmic solution Place 1 drop into both eyes 2 (two) times daily.   ENSURE PLUS Liqd Take 1 Can by mouth 2 (two) times daily between meals. Offer at bedtime and in between meals   gabapentin 300 MG capsule Commonly known as:  NEURONTIN Take 300 mg by mouth at bedtime.   guaiFENesin 600 MG 12 hr tablet Commonly known as:  MUCINEX Take 600 mg by mouth 2 (two) times daily.   HYDROcodone-acetaminophen 5-325 MG tablet Commonly known as:  NORCO/VICODIN Take 1 tablet by mouth every 4 (four) hours as needed for moderate pain or severe pain. No more than 3 grams of Tylenol in 24 hours   ICAPS AREDS 2 Caps Take 1 capsule by mouth 2 (two) times daily.   LASIX 40 MG tablet Generic drug:  furosemide Take 40 mg by mouth daily. Take 1 tab with 20 mg to equal 60 mg by mouth daily   furosemide 20 MG tablet Commonly known as:  LASIX Take 20 mg by mouth daily. Take 1 tab with 40 mg to equal 60 mg by mouth daily  latanoprost 0.005 % ophthalmic solution Commonly known as:  XALATAN Place 1 drop into both eyes at bedtime.   levothyroxine 75 MCG tablet Commonly known as:  SYNTHROID, LEVOTHROID Take 75 mcg by mouth daily before breakfast.   Melatonin 1 MG Tabs Take 1 mg by mouth at bedtime.   metoprolol succinate 50 MG 24 hr tablet Commonly known as:  TOPROL-XL Take 50 mg by mouth daily. Take with or immediately following a meal.   mirtazapine 15 MG tablet Commonly known as:  REMERON Take 15 mg by mouth at bedtime.   pantoprazole 40 MG tablet Commonly known as:  PROTONIX Take 40 mg by mouth daily.   polyethylene glycol packet Commonly known as:  MIRALAX / GLYCOLAX Take 17 g by mouth  every other day.   potassium chloride SA 20 MEQ tablet Commonly known as:  K-DUR,KLOR-CON Take 20 mEq by mouth daily.   spironolactone 25 MG tablet Commonly known as:  ALDACTONE Take 12.5 mg by mouth daily.   traMADol 50 MG tablet Commonly known as:  ULTRAM Take 1 tablet (50 mg total) by mouth at bedtime.   Vitamin D3 5000 units Caps Take 5,000 Units by mouth daily.       No orders of the defined types were placed in this encounter.   Immunization History  Administered Date(s) Administered  . Influenza, High Dose Seasonal PF 08/19/2014  . Influenza-Unspecified 08/25/2015, 08/16/2016  . PPD Test 10/31/2015, 11/17/2015  . Pneumococcal Conjugate-13 02/04/2015  . Pneumococcal Polysaccharide-23 01/17/2009  . Pneumococcal-Unspecified 01/17/2009  . Tdap 07/20/2013    Social History  Substance Use Topics  . Smoking status: Never Smoker  . Smokeless tobacco: Never Used  . Alcohol use No    Review of Systems  DATA OBTAINED: from patient, nurse GENERAL:  no fevers, fatigue, appetite changes SKIN: No itching, rash HEENT: No complaint RESPIRATORY: No cough, wheezing, SOB CARDIAC: No chest pain, palpitations, lower extremity edema  GI: No abdominal pain, No N/V/D or constipation, No heartburn or reflux  GU: No dysuria, frequency or urgency, or incontinence  MUSCULOSKELETAL: No unrelieved bone/joint pain NEUROLOGIC: No headache, dizziness  PSYCHIATRIC: No overt anxiety or sadness  Vitals:   05/29/17 1131  BP: 130/71  Pulse: 65  Resp: 18  Temp: 97.9 F (36.6 C)   Body mass index is 23.43 kg/m. Physical Exam  GENERAL APPEARANCE: Alert, conversant, No acute distress  SKIN: No diaphoresis rash HEENT: Unremarkable RESPIRATORY: Breathing is even, unlabored. Lung sounds are clear   CARDIOVASCULAR: Heart RRR no murmurs, rubs or gallops. No peripheral edema  GASTROINTESTINAL: Abdomen is soft, non-tender, not distended w/ normal bowel sounds.  GENITOURINARY: Bladder  non tender, not distended  MUSCULOSKELETAL: No abnormal joints or musculature NEUROLOGIC: Cranial nerves 2-12 grossly intact. Moves all extremities PSYCHIATRIC: Mood and affect appropriate to situationWith dementia, no behavioral issues  Patient Active Problem List   Diagnosis Date Noted  . Hypothyroidism 02/02/2017  . Depression 07/05/2016  . GERD (gastroesophageal reflux disease) 06/10/2016  . Cerumen debris on tympanic membrane 03/14/2016  . PVD (peripheral vascular disease) ([place]) 01/24/2016  . Dementia without behavioral disturbance 01/01/2016  . Encounter for family conference with patient present 01/01/2016  . Aspiration of food 12/17/2015  . Speech abnormality 11/29/2015  . Senile purpura ([place]) 11/29/2015  . Hip pain, acute 11/06/2015  . Acute encephalopathy 11/06/2015  . Bilateral lower extremity edema 11/06/2015  . UTI (urinary tract infection) 11/06/2015  . Chronic respiratory failure with hypoxia ([place]) 11/06/2015  . Closed left hip fracture (  [place]) 09/09/2015  . Chronic diastolic CHF (congestive heart failure) ([place]) 09/09/2015  . COPD (chronic obstructive pulmonary disease) ([place])   . Atrial fibrillation ([place])   . Hypertensive heart disease with CHF (congestive heart failure) ([place])   . Chronic bronchitis ([place])   . Essential hypertension   . Fall   . Closed pertrochanteric fracture ([facility])     CMP     Component Value Date/Time   NA 141 03/27/2017   K 4.7 03/27/2017   CL 107 09/13/2015 0448   CO2 24 09/13/2015 0448   GLUCOSE 101 (H) 09/13/2015 0448   BUN 24 (A) 03/27/2017   CREATININE 0.9 03/27/2017   CREATININE 0.66 09/13/2015 0448   CALCIUM 8.7 (L) 09/13/2015 0448   PROT 6.9 09/08/2015 2325   ALBUMIN 4.1 09/08/2015 2325   AST 17 03/25/2017   ALT 6 (A) 03/25/2017   ALKPHOS 82 03/25/2017   BILITOT 1.3 (H) 09/08/2015 2325   GFRNONAA >60 09/13/2015 0448   GFRAA >60 09/13/2015 0448    Recent Labs  09/20/16 03/25/17 03/27/17  NA 141 139 141  K 4.0 5.8* 4.7    BUN 20 21 24*  CREATININE 0.9 0.7 0.9    Recent Labs  09/20/16 03/25/17  AST 21 17  ALT 8 6*  ALKPHOS 77 82    Recent Labs  09/20/16 03/25/17  WBC 4.7 9.3  HGB 13.7 14.8  HCT 42 46  PLT 143* 132*    Recent Labs  09/20/16  CHOL 149  LDLCALC 69  TRIG 58   No results found for: [facility] Lab Results  Component Value Date   TSH 1.13 03/25/2017   Lab Results  Component Value Date   HGBA1C 5.1 09/20/2016   Lab Results  Component Value Date   CHOL 149 09/20/2016   HDL 68 09/20/2016   LDLCALC 69 09/20/2016   TRIG 58 09/20/2016    Significant Diagnostic Results in last 30 days:  No results found.  Assessment and Plan  Depression Stable; patient seems in good made; plan to continue Remeron 15 mg by mouth daily at bedtime  Dementia without behavioral disturbance Stable with no decline; patient out of her room doing activities; plan to continue supportive care  Hypothyroidism Most recent TSH is 1.19; plan to continue Synthroid 75 g by mouth daily    Clea Dubach D. Sheppard Coil, MD

## 2017-06-02 ENCOUNTER — Encounter: Payer: Self-pay | Admitting: Internal Medicine

## 2017-06-02 NOTE — Assessment & Plan Note (Signed)
Stable; rate is controlled with Toprol-XL 50 mg by mouth daily and patient is prophylaxed with ASA 325 mg by mouth daily

## 2017-06-02 NOTE — Assessment & Plan Note (Signed)
No reported episodes of aspiration or reflux; plan to continue Protonix 40 mg by mouth daily

## 2017-06-02 NOTE — Assessment & Plan Note (Signed)
No reported wounds or skin breakdowns, plan to continue ASA 325 mg by mouth daily

## 2017-06-04 DIAGNOSIS — L603 Nail dystrophy: Secondary | ICD-10-CM | POA: Diagnosis not present

## 2017-06-04 DIAGNOSIS — M79674 Pain in right toe(s): Secondary | ICD-10-CM | POA: Diagnosis not present

## 2017-06-04 DIAGNOSIS — M79675 Pain in left toe(s): Secondary | ICD-10-CM | POA: Diagnosis not present

## 2017-06-04 DIAGNOSIS — I739 Peripheral vascular disease, unspecified: Secondary | ICD-10-CM | POA: Diagnosis not present

## 2017-06-04 DIAGNOSIS — B351 Tinea unguium: Secondary | ICD-10-CM | POA: Diagnosis not present

## 2017-06-07 DIAGNOSIS — R262 Difficulty in walking, not elsewhere classified: Secondary | ICD-10-CM | POA: Diagnosis not present

## 2017-06-07 DIAGNOSIS — R2681 Unsteadiness on feet: Secondary | ICD-10-CM | POA: Diagnosis not present

## 2017-06-07 DIAGNOSIS — J449 Chronic obstructive pulmonary disease, unspecified: Secondary | ICD-10-CM | POA: Diagnosis not present

## 2017-06-09 DIAGNOSIS — J449 Chronic obstructive pulmonary disease, unspecified: Secondary | ICD-10-CM | POA: Diagnosis not present

## 2017-06-09 DIAGNOSIS — R2681 Unsteadiness on feet: Secondary | ICD-10-CM | POA: Diagnosis not present

## 2017-06-09 DIAGNOSIS — R262 Difficulty in walking, not elsewhere classified: Secondary | ICD-10-CM | POA: Diagnosis not present

## 2017-06-10 DIAGNOSIS — F331 Major depressive disorder, recurrent, moderate: Secondary | ICD-10-CM | POA: Diagnosis not present

## 2017-06-10 DIAGNOSIS — R2681 Unsteadiness on feet: Secondary | ICD-10-CM | POA: Diagnosis not present

## 2017-06-10 DIAGNOSIS — F0390 Unspecified dementia without behavioral disturbance: Secondary | ICD-10-CM | POA: Diagnosis not present

## 2017-06-10 DIAGNOSIS — R262 Difficulty in walking, not elsewhere classified: Secondary | ICD-10-CM | POA: Diagnosis not present

## 2017-06-10 DIAGNOSIS — J449 Chronic obstructive pulmonary disease, unspecified: Secondary | ICD-10-CM | POA: Diagnosis not present

## 2017-06-10 DIAGNOSIS — G4700 Insomnia, unspecified: Secondary | ICD-10-CM | POA: Diagnosis not present

## 2017-06-11 DIAGNOSIS — J449 Chronic obstructive pulmonary disease, unspecified: Secondary | ICD-10-CM | POA: Diagnosis not present

## 2017-06-11 DIAGNOSIS — R262 Difficulty in walking, not elsewhere classified: Secondary | ICD-10-CM | POA: Diagnosis not present

## 2017-06-11 DIAGNOSIS — R2681 Unsteadiness on feet: Secondary | ICD-10-CM | POA: Diagnosis not present

## 2017-06-12 DIAGNOSIS — R2681 Unsteadiness on feet: Secondary | ICD-10-CM | POA: Diagnosis not present

## 2017-06-12 DIAGNOSIS — J449 Chronic obstructive pulmonary disease, unspecified: Secondary | ICD-10-CM | POA: Diagnosis not present

## 2017-06-12 DIAGNOSIS — R262 Difficulty in walking, not elsewhere classified: Secondary | ICD-10-CM | POA: Diagnosis not present

## 2017-06-13 DIAGNOSIS — R262 Difficulty in walking, not elsewhere classified: Secondary | ICD-10-CM | POA: Diagnosis not present

## 2017-06-13 DIAGNOSIS — R2681 Unsteadiness on feet: Secondary | ICD-10-CM | POA: Diagnosis not present

## 2017-06-13 DIAGNOSIS — J449 Chronic obstructive pulmonary disease, unspecified: Secondary | ICD-10-CM | POA: Diagnosis not present

## 2017-06-14 DIAGNOSIS — R262 Difficulty in walking, not elsewhere classified: Secondary | ICD-10-CM | POA: Diagnosis not present

## 2017-06-14 DIAGNOSIS — R2681 Unsteadiness on feet: Secondary | ICD-10-CM | POA: Diagnosis not present

## 2017-06-14 DIAGNOSIS — J449 Chronic obstructive pulmonary disease, unspecified: Secondary | ICD-10-CM | POA: Diagnosis not present

## 2017-06-16 DIAGNOSIS — R262 Difficulty in walking, not elsewhere classified: Secondary | ICD-10-CM | POA: Diagnosis not present

## 2017-06-16 DIAGNOSIS — R2681 Unsteadiness on feet: Secondary | ICD-10-CM | POA: Diagnosis not present

## 2017-06-16 DIAGNOSIS — J449 Chronic obstructive pulmonary disease, unspecified: Secondary | ICD-10-CM | POA: Diagnosis not present

## 2017-06-17 DIAGNOSIS — R262 Difficulty in walking, not elsewhere classified: Secondary | ICD-10-CM | POA: Diagnosis not present

## 2017-06-17 DIAGNOSIS — J449 Chronic obstructive pulmonary disease, unspecified: Secondary | ICD-10-CM | POA: Diagnosis not present

## 2017-06-17 DIAGNOSIS — R2681 Unsteadiness on feet: Secondary | ICD-10-CM | POA: Diagnosis not present

## 2017-06-18 DIAGNOSIS — R262 Difficulty in walking, not elsewhere classified: Secondary | ICD-10-CM | POA: Diagnosis not present

## 2017-06-18 DIAGNOSIS — R2681 Unsteadiness on feet: Secondary | ICD-10-CM | POA: Diagnosis not present

## 2017-06-18 DIAGNOSIS — J449 Chronic obstructive pulmonary disease, unspecified: Secondary | ICD-10-CM | POA: Diagnosis not present

## 2017-06-19 DIAGNOSIS — R2681 Unsteadiness on feet: Secondary | ICD-10-CM | POA: Diagnosis not present

## 2017-06-19 DIAGNOSIS — R262 Difficulty in walking, not elsewhere classified: Secondary | ICD-10-CM | POA: Diagnosis not present

## 2017-06-19 DIAGNOSIS — H353232 Exudative age-related macular degeneration, bilateral, with inactive choroidal neovascularization: Secondary | ICD-10-CM | POA: Diagnosis not present

## 2017-06-19 DIAGNOSIS — H401134 Primary open-angle glaucoma, bilateral, indeterminate stage: Secondary | ICD-10-CM | POA: Diagnosis not present

## 2017-06-19 DIAGNOSIS — Z961 Presence of intraocular lens: Secondary | ICD-10-CM | POA: Diagnosis not present

## 2017-06-19 DIAGNOSIS — J449 Chronic obstructive pulmonary disease, unspecified: Secondary | ICD-10-CM | POA: Diagnosis not present

## 2017-06-20 DIAGNOSIS — J449 Chronic obstructive pulmonary disease, unspecified: Secondary | ICD-10-CM | POA: Diagnosis not present

## 2017-06-20 DIAGNOSIS — R262 Difficulty in walking, not elsewhere classified: Secondary | ICD-10-CM | POA: Diagnosis not present

## 2017-06-20 DIAGNOSIS — R2681 Unsteadiness on feet: Secondary | ICD-10-CM | POA: Diagnosis not present

## 2017-06-21 DIAGNOSIS — R262 Difficulty in walking, not elsewhere classified: Secondary | ICD-10-CM | POA: Diagnosis not present

## 2017-06-21 DIAGNOSIS — R2681 Unsteadiness on feet: Secondary | ICD-10-CM | POA: Diagnosis not present

## 2017-06-21 DIAGNOSIS — J449 Chronic obstructive pulmonary disease, unspecified: Secondary | ICD-10-CM | POA: Diagnosis not present

## 2017-06-23 DIAGNOSIS — R262 Difficulty in walking, not elsewhere classified: Secondary | ICD-10-CM | POA: Diagnosis not present

## 2017-06-23 DIAGNOSIS — J449 Chronic obstructive pulmonary disease, unspecified: Secondary | ICD-10-CM | POA: Diagnosis not present

## 2017-06-23 DIAGNOSIS — R2681 Unsteadiness on feet: Secondary | ICD-10-CM | POA: Diagnosis not present

## 2017-06-24 DIAGNOSIS — R2681 Unsteadiness on feet: Secondary | ICD-10-CM | POA: Diagnosis not present

## 2017-06-24 DIAGNOSIS — R262 Difficulty in walking, not elsewhere classified: Secondary | ICD-10-CM | POA: Diagnosis not present

## 2017-06-24 DIAGNOSIS — J449 Chronic obstructive pulmonary disease, unspecified: Secondary | ICD-10-CM | POA: Diagnosis not present

## 2017-06-25 DIAGNOSIS — R262 Difficulty in walking, not elsewhere classified: Secondary | ICD-10-CM | POA: Diagnosis not present

## 2017-06-25 DIAGNOSIS — R2681 Unsteadiness on feet: Secondary | ICD-10-CM | POA: Diagnosis not present

## 2017-06-25 DIAGNOSIS — J449 Chronic obstructive pulmonary disease, unspecified: Secondary | ICD-10-CM | POA: Diagnosis not present

## 2017-07-02 ENCOUNTER — Non-Acute Institutional Stay (SKILLED_NURSING_FACILITY): Payer: Medicare Other | Admitting: Internal Medicine

## 2017-07-02 ENCOUNTER — Encounter: Payer: Self-pay | Admitting: Internal Medicine

## 2017-07-02 DIAGNOSIS — I5032 Chronic diastolic (congestive) heart failure: Secondary | ICD-10-CM

## 2017-07-02 DIAGNOSIS — H353130 Nonexudative age-related macular degeneration, bilateral, stage unspecified: Secondary | ICD-10-CM

## 2017-07-02 DIAGNOSIS — H409 Unspecified glaucoma: Secondary | ICD-10-CM | POA: Diagnosis not present

## 2017-07-02 DIAGNOSIS — I110 Hypertensive heart disease with heart failure: Secondary | ICD-10-CM | POA: Diagnosis not present

## 2017-07-02 NOTE — Progress Notes (Signed)
Location:  [facility] Room Number: 213W Place of Service:  SNF (31)  [name], MD  Patient Care Team: [name], MD as PCP - General (Internal Medicine)  Extended Emergency Contact Information Primary Emergency Contact: [name],[name] Address: [address]          [place], [place] [address] [name] of [place] Phone: [phone] Relation: Daughter    Allergies: Lyrica [pregabalin] and Penicillins  Chief Complaint  Patient presents with  . Medical Management of Chronic Issues    routine visit    HPI: Patient is 83 y.o. female who Is being seen for routine issues of glaucoma, macular degeneration, and hypertension.  Past Medical History:  Diagnosis Date  . Acute encephalopathy 11/06/2015  . Atrial fibrillation ([place])   . CHF (congestive heart failure) ([place])   . Chronic diastolic CHF (congestive heart failure) ([place]) 09/09/2015  . Chronic respiratory failure with hypoxia ([place]) 11/06/2015  . Closed left hip fracture ([place]) 09/09/2015  . Closed pertrochanteric fracture ([place])   . COPD (chronic obstructive pulmonary disease) ([place])   . Dementia without behavioral disturbance 01/01/2016  . GERD (gastroesophageal reflux disease) 06/10/2016  . Glaucoma   . Hypertension   . Hypothyroidism 02/02/2017  . PVD (peripheral vascular disease) ([place]) 01/24/2016  . Speech abnormality 11/29/2015    Past Surgical History:  Procedure Laterality Date  . FEMUR IM NAIL Left 09/09/2015   Procedure: INTRAMEDULLARY (IM) NAIL FEMORAL;  Surgeon: Rod Can, MD;  Location: [facility];  Service: Orthopedics;  Laterality: Left;  . HERNIA REPAIR      Allergies as of 07/02/2017      Reactions   Lyrica [pregabalin] Other (See Comments)   Caused her to be lethargic and her legs felt as if she could not stand   Penicillins Rash      Medication List       Accurate as of 07/02/17 11:59 PM. Always use your most recent med list.            acetaminophen 325 MG tablet Commonly known as:  TYLENOL Take 650 mg by mouth every 4 (four) hours as needed for moderate pain.   aspirin 325 MG EC tablet Take 325 mg by mouth daily.   brimonidine 0.1 % Soln Commonly known as:  ALPHAGAN P Place 1 drop into both eyes 2 (two) times daily.   carboxymethylcellulose 1 % ophthalmic solution Place 1 drop into both eyes 2 (two) times daily.   ENSURE PLUS Liqd Take 1 Can by mouth 2 (two) times daily between meals. Offer at bedtime and in between meals   gabapentin 300 MG capsule Commonly known as:  NEURONTIN Take 300 mg by mouth at bedtime.   guaiFENesin 600 MG 12 hr tablet Commonly known as:  MUCINEX Take 600 mg by mouth 2 (two) times daily.   HYDROcodone-acetaminophen 5-325 MG tablet Commonly known as:  NORCO/VICODIN Take 1 tablet by mouth every 4 (four) hours as needed for moderate pain or severe pain. No more than 3 grams of Tylenol in 24 hours   ICAPS AREDS 2 Caps Take 1 capsule by mouth 2 (two) times daily.   LASIX 40 MG tablet Generic drug:  furosemide Take 40 mg by mouth daily. Take 1 tab with 20 mg to equal 60 mg by mouth daily   furosemide 20 MG tablet Commonly known as:  LASIX Take 20 mg by mouth daily. Take 1 tab with 40 mg to equal 60 mg by mouth  daily   latanoprost 0.005 % ophthalmic solution Commonly known as:  XALATAN Place 1 drop into both eyes at bedtime.   levothyroxine 75 MCG tablet Commonly known as:  SYNTHROID, LEVOTHROID Take 75 mcg by mouth daily before breakfast.   Melatonin 1 MG Tabs Take by mouth. Take 2 tablets ( 2 mg) at bedtime for sleep   metoprolol succinate 50 MG 24 hr tablet Commonly known as:  TOPROL-XL Take 50 mg by mouth daily. Take with or immediately following a meal.   mirtazapine 15 MG tablet Commonly known as:  REMERON Take 15 mg by mouth at bedtime.   pantoprazole 40 MG tablet Commonly known as:  PROTONIX Take 40 mg by mouth daily.   polyethylene glycol packet Commonly  known as:  MIRALAX / GLYCOLAX Take 17 g by mouth every other day.   potassium chloride SA 20 MEQ tablet Commonly known as:  K-DUR,KLOR-CON Take 20 mEq by mouth daily.   spironolactone 25 MG tablet Commonly known as:  ALDACTONE Take 12.5 mg by mouth daily.   traMADol 50 MG tablet Commonly known as:  ULTRAM Take 1 tablet (50 mg total) by mouth at bedtime.   Vitamin D3 5000 units Caps Take 5,000 Units by mouth daily.       Meds ordered this encounter  Medications  . Melatonin 1 MG TABS    Sig: Take by mouth. Take 2 tablets ( 2 mg) at bedtime for sleep    Immunization History  Administered Date(s) Administered  . Influenza, High Dose Seasonal PF 08/19/2014  . Influenza-Unspecified 08/25/2015, 08/16/2016  . PPD Test 10/31/2015, 11/17/2015  . Pneumococcal Conjugate-13 02/04/2015  . Pneumococcal Polysaccharide-23 01/17/2009  . Pneumococcal-Unspecified 01/17/2009  . Tdap 07/20/2013    Social History  Substance Use Topics  . Smoking status: Never Smoker  . Smokeless tobacco: Never Used  . Alcohol use No    Review of Systems  DATA OBTAINED: from patient, nurse GENERAL:  no fevers, fatigue, appetite changes SKIN: No itching, rash HEENT: No complaint RESPIRATORY: No cough, wheezing, SOB CARDIAC: No chest pain, palpitations, lower extremity edema  GI: No abdominal pain, No N/V/D or constipation, No heartburn or reflux  GU: No dysuria, frequency or urgency, or incontinence  MUSCULOSKELETAL: No unrelieved bone/joint pain NEUROLOGIC: No headache, dizziness  PSYCHIATRIC: No overt anxiety or sadness  Vitals:   07/02/17 1400  BP: 130/71  Pulse: 74  Resp: 18  Temp: 97.9 F (36.6 C)   Body mass index is 23.62 kg/m. Physical Exam  GENERAL APPEARANCE: Alert, conversant, No acute distress  SKIN: No diaphoresis rash HEENT: Unremarkable RESPIRATORY: Breathing is even, unlabored. Lung sounds are clear   CARDIOVASCULAR: Heart RRR no murmurs, rubs or gallops. No  peripheral edema  GASTROINTESTINAL: Abdomen is soft, non-tender, not distended w/ normal bowel sounds.  GENITOURINARY: Bladder non tender, not distended  MUSCULOSKELETAL: No abnormal joints or musculature NEUROLOGIC: Cranial nerves 2-12 grossly intact. Moves all extremities PSYCHIATRIC: Mood and affect With dementia, no behavioral issues  Physical examination has not changed since prior visit.  Patient Active Problem List   Diagnosis Date Noted  . Glaucoma 07/14/2017  . Macular degeneration 07/14/2017  . Hypothyroidism 02/02/2017  . Depression 07/05/2016  . GERD (gastroesophageal reflux disease) 06/10/2016  . Cerumen debris on tympanic membrane 03/14/2016  . PVD (peripheral vascular disease) ([place]) 01/24/2016  . Dementia without behavioral disturbance 01/01/2016  . Encounter for family conference with patient present 01/01/2016  . Aspiration of food 12/17/2015  . Speech abnormality 11/29/2015  .  Senile purpura ([place]) 11/29/2015  . Hip pain, acute 11/06/2015  . Acute encephalopathy 11/06/2015  . Bilateral lower extremity edema 11/06/2015  . UTI (urinary tract infection) 11/06/2015  . Chronic respiratory failure with hypoxia ([place]) 11/06/2015  . Closed left hip fracture ([place]) 09/09/2015  . Chronic diastolic CHF (congestive heart failure) ([place]) 09/09/2015  . COPD (chronic obstructive pulmonary disease) ([place])   . Atrial fibrillation ([place])   . Hypertensive heart disease with CHF (congestive heart failure) ([place])   . Chronic bronchitis ([place])   . Essential hypertension   . Fall   . Closed pertrochanteric fracture ([facility])     CMP     Component Value Date/Time   NA 141 03/27/2017   K 4.7 03/27/2017   CL 107 09/13/2015 0448   CO2 24 09/13/2015 0448   GLUCOSE 101 (H) 09/13/2015 0448   BUN 24 (A) 03/27/2017   CREATININE 0.9 03/27/2017   CREATININE 0.66 09/13/2015 0448   CALCIUM 8.7 (L) 09/13/2015 0448   PROT 6.9 09/08/2015 2325   ALBUMIN 4.1 09/08/2015 2325   AST 17 03/25/2017    ALT 6 (A) 03/25/2017   ALKPHOS 82 03/25/2017   BILITOT 1.3 (H) 09/08/2015 2325   GFRNONAA >60 09/13/2015 0448   GFRAA >60 09/13/2015 0448    Recent Labs  09/20/16 03/25/17 03/27/17  NA 141 139 141  K 4.0 5.8* 4.7  BUN 20 21 24*  CREATININE 0.9 0.7 0.9    Recent Labs  09/20/16 03/25/17  AST 21 17  ALT 8 6*  ALKPHOS 77 82    Recent Labs  09/20/16 03/25/17  WBC 4.7 9.3  HGB 13.7 14.8  HCT 42 46  PLT 143* 132*    Recent Labs  09/20/16  CHOL 149  LDLCALC 69  TRIG 58   No results found for: [facility] Lab Results  Component Value Date   TSH 1.13 03/25/2017   Lab Results  Component Value Date   HGBA1C 5.1 09/20/2016   Lab Results  Component Value Date   CHOL 149 09/20/2016   HDL 68 09/20/2016   LDLCALC 69 09/20/2016   TRIG 58 09/20/2016    Significant Diagnostic Results in last 30 days:  No results found.  Assessment and Plan  Glaucoma Chronic and stable; plan to continue Alphagan 0.1% drops 1 drop twice a day and xalatan 0.005% one drop daily at bedtime  Macular degeneration Chronic and stable; continue Arimidex 2 caps 1 by mouth twice a day  Hypertensive heart disease with CHF (congestive heart failure) (HCC) Controlled; plan to continue Aldactone 12.5 mg by mouth daily, Lasix 60 mg by mouth daily, and Toprol-XL 50 mg by mouth daily    Tarnesha Ulloa D. Sheppard Coil, MD

## 2017-07-04 IMAGING — RF DG SWALLOWING FUNCTION
1 series · 1 of 1 positions shown · non-contrast
Comparison: Chest radiographs 10/25/2015

CLINICAL DATA: [AGE] female with dysphagia, feels food is
getting stuck at the level of the sternal notch. Initial encounter.

EXAM:
MODIFIED BARIUM SWALLOW
TECHNIQUE: Different consistencies of barium were administered orally to the
patient by the Speech Pathologist. Imaging of the pharynx was
performed in the lateral projection.
FLUOROSCOPY TIME:  Radiation Exposure Index (as provided by the
fluoroscopic device): 14.91 mGy
If the device does not provide the exposure index:
Fluoroscopy Time:  1 minutes 54 seconds

[Series 1: run · 1 of 1 slices shown]
[im 1/1]
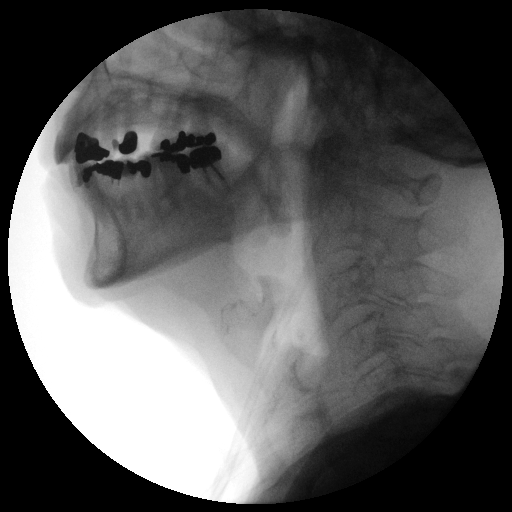

[1 of 1 positions shown; findings below may reference images not displayed]

FINDINGS: Thin liquid- premature spill. Mild retention. When drinking thin
liquid with a straw flash penetration was noted. No aspiration.

Nectar thick liquid- premature spill with delayed swallow trigger.

Honey- not administered

Applesauce - mild retention.

Njie?Amma with cracker- premature spill.

Barium tablet - administered with thin liquid. The tablet lodged in
the vallecula, and the patient seemed unaware of this. She was given
additional thin liquid and ultimately apple sauce. After the
applesauce the tablet then passed into the esophagus. It was seen at
the level of the gastroesophageal junction but not yet within the
stomach. Also, mild retention of barium was noted throughout the
thoracic esophagus, likely generalized related to decreased
esophageal motility (presbyesophagus).
IMPRESSION: Dominant findings of premature spill and retention in the vallecula.
Barium tablet became lodged in the vallecula briefly as above. Flash
penetration only with thin liquid when drinking with a straw.
Presbyesophagus suspected.

Please refer to the Speech Pathologists report for complete details
and recommendations.

## 2017-07-04 NOTE — Assessment & Plan Note (Signed)
Stable; patient seems in good made; plan to continue Remeron 15 mg by mouth daily at bedtime

## 2017-07-04 NOTE — Assessment & Plan Note (Signed)
Most recent TSH is 1.19; plan to continue Synthroid 75 g by mouth daily

## 2017-07-04 NOTE — Assessment & Plan Note (Signed)
Stable with no decline; patient out of her room doing activities; plan to continue supportive care

## 2017-07-14 ENCOUNTER — Encounter: Payer: Self-pay | Admitting: Internal Medicine

## 2017-07-14 DIAGNOSIS — H3530 Unspecified macular degeneration: Secondary | ICD-10-CM | POA: Insufficient documentation

## 2017-07-14 DIAGNOSIS — H409 Unspecified glaucoma: Secondary | ICD-10-CM | POA: Insufficient documentation

## 2017-07-14 NOTE — Assessment & Plan Note (Signed)
Chronic and stable; plan to continue Alphagan 0.1% drops 1 drop twice a day and xalatan 0.005% one drop daily at bedtime

## 2017-07-14 NOTE — Assessment & Plan Note (Signed)
Controlled; plan to continue Aldactone 12.5 mg by mouth daily, Lasix 60 mg by mouth daily, and Toprol-XL 50 mg by mouth daily

## 2017-07-14 NOTE — Assessment & Plan Note (Signed)
Chronic and stable; continue Arimidex 2 caps 1 by mouth twice a day

## 2017-07-18 DIAGNOSIS — G4700 Insomnia, unspecified: Secondary | ICD-10-CM | POA: Diagnosis not present

## 2017-07-18 DIAGNOSIS — F0390 Unspecified dementia without behavioral disturbance: Secondary | ICD-10-CM | POA: Diagnosis not present

## 2017-07-18 DIAGNOSIS — F331 Major depressive disorder, recurrent, moderate: Secondary | ICD-10-CM | POA: Diagnosis not present

## 2017-07-29 ENCOUNTER — Encounter: Payer: Self-pay | Admitting: Internal Medicine

## 2017-07-29 ENCOUNTER — Non-Acute Institutional Stay (SKILLED_NURSING_FACILITY): Payer: Medicare Other | Admitting: Internal Medicine

## 2017-07-29 DIAGNOSIS — R600 Localized edema: Secondary | ICD-10-CM | POA: Diagnosis not present

## 2017-07-29 DIAGNOSIS — I5032 Chronic diastolic (congestive) heart failure: Secondary | ICD-10-CM

## 2017-07-29 DIAGNOSIS — J42 Unspecified chronic bronchitis: Secondary | ICD-10-CM | POA: Diagnosis not present

## 2017-07-29 NOTE — Progress Notes (Signed)
Location:  [facility] Room Number: [place] of Service:  SNF (31)  [name], MD  Patient Care Team: [name], MD as PCP - General (Internal Medicine)  Extended Emergency Contact Information Primary Emergency Contact: [name],[name] Address: [address]          [place],  [address] [name] of [place] Phone: [phone] Relation: Daughter    Allergies: Lyrica [pregabalin] and Penicillins  Chief Complaint  Patient presents with  . Medical Management of Chronic Issues    routine visit    HPI: Patient is 83 y.o. female who Is being seen for routine issues of congestive heart failure, COPD, and bilateral lower extremity edema.  Past Medical History:  Diagnosis Date  . Acute encephalopathy 11/06/2015  . Atrial fibrillation ([place])   . CHF (congestive heart failure) ([place])   . Chronic diastolic CHF (congestive heart failure) ([place]) 09/09/2015  . Chronic respiratory failure with hypoxia ([place]) 11/06/2015  . Closed left hip fracture ([place]) 09/09/2015  . Closed pertrochanteric fracture ([place])   . COPD (chronic obstructive pulmonary disease) ([place])   . Dementia without behavioral disturbance 01/01/2016  . GERD (gastroesophageal reflux disease) 06/10/2016  . Glaucoma   . Hypertension   . Hypothyroidism 02/02/2017  . PVD (peripheral vascular disease) ([place]) 01/24/2016  . Speech abnormality 11/29/2015    Past Surgical History:  Procedure Laterality Date  . FEMUR IM NAIL Left 09/09/2015   Procedure: INTRAMEDULLARY (IM) NAIL FEMORAL;  Surgeon: Rod Can, MD;  Location: [facility];  Service: Orthopedics;  Laterality: Left;  . HERNIA REPAIR      Allergies as of 07/29/2017      Reactions   Lyrica [pregabalin] Other (See Comments)   Caused her to be lethargic and her legs felt as if she could not stand   Penicillins Rash      Medication List       Accurate as of 07/29/17 11:59 PM. Always use your most recent med list.            acetaminophen 325 MG tablet Commonly known as:  TYLENOL Take 650 mg by mouth every 4 (four) hours as needed for moderate pain.   aspirin 325 MG EC tablet Take 325 mg by mouth daily.   BIOFREEZE 4 % Gel Generic drug:  Menthol (Topical Analgesic) Apply topically. Apply sparingly to affected areas three times daily as needed for pain   brimonidine 0.1 % Soln Commonly known as:  ALPHAGAN P Place 1 drop into both eyes 2 (two) times daily.   carboxymethylcellulose 1 % ophthalmic solution Place 1 drop into both eyes 2 (two) times daily.   ENSURE PLUS Liqd Take 1 Can by mouth 2 (two) times daily between meals. Offer at bedtime and in between meals   gabapentin 300 MG capsule Commonly known as:  NEURONTIN Take 300 mg by mouth at bedtime.   guaiFENesin 600 MG 12 hr tablet Commonly known as:  MUCINEX Take 600 mg by mouth 2 (two) times daily.   HYDROcodone-acetaminophen 5-325 MG tablet Commonly known as:  NORCO/VICODIN Take 1 tablet by mouth every 4 (four) hours as needed for moderate pain or severe pain. No more than 3 grams of Tylenol in 24 hours   ICAPS AREDS 2 Caps Take 1 capsule by mouth 2 (two) times daily.   LASIX 40 MG tablet Generic drug:  furosemide Take 40 mg by mouth daily. Take 1 tab with 20 mg to equal 60 mg by  mouth daily   furosemide 20 MG tablet Commonly known as:  LASIX Take 20 mg by mouth daily. Take 1 tab with 40 mg to equal 60 mg by mouth daily   latanoprost 0.005 % ophthalmic solution Commonly known as:  XALATAN Place 1 drop into both eyes at bedtime.   levothyroxine 75 MCG tablet Commonly known as:  SYNTHROID, LEVOTHROID Take 75 mcg by mouth daily before breakfast.   Melatonin 1 MG Tabs Take by mouth. Take 2 tablets ( 2 mg) at bedtime for sleep   metoprolol succinate 50 MG 24 hr tablet Commonly known as:  TOPROL-XL Take 50 mg by mouth daily. Take with or immediately following a meal.   mirtazapine 15 MG tablet Commonly known as:   REMERON Take 15 mg by mouth. Take 1/2 tablet at bedtime   pantoprazole 40 MG tablet Commonly known as:  PROTONIX Take 40 mg by mouth daily.   polyethylene glycol packet Commonly known as:  MIRALAX / GLYCOLAX Take 17 g by mouth every other day.   potassium chloride SA 20 MEQ tablet Commonly known as:  K-DUR,KLOR-CON Take 20 mEq by mouth daily.   spironolactone 25 MG tablet Commonly known as:  ALDACTONE Take 12.5 mg by mouth daily.   traMADol 50 MG tablet Commonly known as:  ULTRAM Take 1 tablet (50 mg total) by mouth at bedtime.   Vitamin D3 5000 units Caps Take 5,000 Units by mouth daily.       Meds ordered this encounter  Medications  . Menthol, Topical Analgesic, (BIOFREEZE) 4 % GEL    Sig: Apply topically. Apply sparingly to affected areas three times daily as needed for pain    Immunization History  Administered Date(s) Administered  . Influenza, High Dose Seasonal PF 08/19/2014  . Influenza-Unspecified 08/25/2015, 08/16/2016  . PPD Test 10/31/2015, 11/17/2015  . Pneumococcal Conjugate-13 02/04/2015  . Pneumococcal Polysaccharide-23 01/17/2009  . Pneumococcal-Unspecified 01/17/2009  . Tdap 07/20/2013    Social History  Substance Use Topics  . Smoking status: Never Smoker  . Smokeless tobacco: Never Used  . Alcohol use No    Review of Systems  DATA OBTAINED: from patient-Can participate some; nursing-no concerns GENERAL:  no fevers, fatigue, appetite changes SKIN: No itching, rash HEENT: No complaint RESPIRATORY: No cough, wheezing, SOB CARDIAC: No chest pain, palpitations, lower extremity edema  GI: No abdominal pain, No N/V/D or constipation, No heartburn or reflux  GU: No dysuria, frequency or urgency, or incontinence  MUSCULOSKELETAL: No unrelieved bone/joint pain NEUROLOGIC: No headache, dizziness  PSYCHIATRIC: No overt anxiety or sadness  Vitals:   07/29/17 1445  BP: 130/71  Pulse: 80  Resp: 20  Temp: 97.9 F (36.6 C)   Body mass  index is 23.81 kg/m. Physical Exam  GENERAL APPEARANCE: Alert, conversant, No acute distress  SKIN: No diaphoresis rash HEENT: Unremarkable RESPIRATORY: Breathing is even, unlabored. Lung sounds are clear   CARDIOVASCULAR: Heart RRR no murmurs, rubs or gallops. + peripheral edema  GASTROINTESTINAL: Abdomen is soft, non-tender, not distended w/ normal bowel sounds.  GENITOURINARY: Bladder non tender, not distended  MUSCULOSKELETAL: No abnormal joints or musculature NEUROLOGIC: Cranial nerves 2-12 grossly intact. Moves all extremities PSYCHIATRIC: Mood and affect with dementia, no behavioral issues  Patient Active Problem List   Diagnosis Date Noted  . Glaucoma 07/14/2017  . Macular degeneration 07/14/2017  . Hypothyroidism 02/02/2017  . Depression 07/05/2016  . GERD (gastroesophageal reflux disease) 06/10/2016  . Cerumen debris on tympanic membrane 03/14/2016  . PVD (peripheral vascular disease) (  [place]) 01/24/2016  . Dementia without behavioral disturbance 01/01/2016  . Encounter for family conference with patient present 01/01/2016  . Aspiration of food 12/17/2015  . Speech abnormality 11/29/2015  . Senile purpura ([place]) 11/29/2015  . Hip pain, acute 11/06/2015  . Acute encephalopathy 11/06/2015  . Bilateral lower extremity edema 11/06/2015  . UTI (urinary tract infection) 11/06/2015  . Chronic respiratory failure with hypoxia ([place]) 11/06/2015  . Closed left hip fracture ([place]) 09/09/2015  . Chronic diastolic CHF (congestive heart failure) ([place]) 09/09/2015  . COPD (chronic obstructive pulmonary disease) ([place])   . Atrial fibrillation ([place])   . Hypertensive heart disease with CHF (congestive heart failure) ([place])   . Chronic bronchitis ([place])   . Essential hypertension   . Fall   . Closed pertrochanteric fracture (HCC)     CMP     Component Value Date/Time   NA 141 03/27/2017   K 4.7 03/27/2017   CL 107 09/13/2015 0448   CO2 24 09/13/2015 0448   GLUCOSE 101 (H) 09/13/2015  0448   BUN 24 (A) 03/27/2017   CREATININE 0.9 03/27/2017   CREATININE 0.66 09/13/2015 0448   CALCIUM 8.7 (L) 09/13/2015 0448   PROT 6.9 09/08/2015 2325   ALBUMIN 4.1 09/08/2015 2325   AST 17 03/25/2017   ALT 6 (A) 03/25/2017   ALKPHOS 82 03/25/2017   BILITOT 1.3 (H) 09/08/2015 2325   GFRNONAA >60 09/13/2015 0448   GFRAA >60 09/13/2015 0448    Recent Labs  09/20/16 03/25/17 03/27/17  NA 141 139 141  K 4.0 5.8* 4.7  BUN 20 21 24*  CREATININE 0.9 0.7 0.9    Recent Labs  09/20/16 03/25/17  AST 21 17  ALT 8 6*  ALKPHOS 77 82    Recent Labs  09/20/16 03/25/17  WBC 4.7 9.3  HGB 13.7 14.8  HCT 42 46  PLT 143* 132*    Recent Labs  09/20/16  CHOL 149  LDLCALC 69  TRIG 58   No results found for: [facility] Lab Results  Component Value Date   TSH 1.13 03/25/2017   Lab Results  Component Value Date   HGBA1C 5.1 09/20/2016   Lab Results  Component Value Date   CHOL 149 09/20/2016   HDL 68 09/20/2016   LDLCALC 69 09/20/2016   TRIG 58 09/20/2016    Significant Diagnostic Results in last 30 days:  No results found.  Assessment and Plan  Chronic diastolic CHF (congestive heart failure) (HCC) No reported exacerbation; plan to continue Aldactone 12.5 mg by mouth daily, Lasix 60 mg by mouth daily, and Toprol-XL 50 mg by mouth daily  COPD (chronic obstructive pulmonary disease) ([place]) Patient's had no recent or even sub-recent exacerbation; patient is controlled on guaifenesin 600 mg by mouth twice a day and this will continue  Bilateral lower extremity edema Chronic and stable; plan to continue Aldactone 12.5 mg by mouth daily and Lasix 60 mg by mouth daily; patient will not wear TED hose and patient rarely if ever elevates her legs: We'll continue to encourage     Frantz Quattrone D. Sheppard Coil, MD

## 2017-08-14 DIAGNOSIS — F0390 Unspecified dementia without behavioral disturbance: Secondary | ICD-10-CM | POA: Diagnosis not present

## 2017-08-14 DIAGNOSIS — G4700 Insomnia, unspecified: Secondary | ICD-10-CM | POA: Diagnosis not present

## 2017-08-14 DIAGNOSIS — F331 Major depressive disorder, recurrent, moderate: Secondary | ICD-10-CM | POA: Diagnosis not present

## 2017-08-26 ENCOUNTER — Non-Acute Institutional Stay (SKILLED_NURSING_FACILITY): Payer: Medicare Other | Admitting: Internal Medicine

## 2017-08-26 DIAGNOSIS — I739 Peripheral vascular disease, unspecified: Secondary | ICD-10-CM | POA: Diagnosis not present

## 2017-08-26 DIAGNOSIS — K219 Gastro-esophageal reflux disease without esophagitis: Secondary | ICD-10-CM

## 2017-08-26 DIAGNOSIS — I480 Paroxysmal atrial fibrillation: Secondary | ICD-10-CM

## 2017-08-27 ENCOUNTER — Encounter: Payer: Self-pay | Admitting: Internal Medicine

## 2017-08-27 DIAGNOSIS — J449 Chronic obstructive pulmonary disease, unspecified: Secondary | ICD-10-CM | POA: Diagnosis not present

## 2017-08-27 DIAGNOSIS — R1314 Dysphagia, pharyngoesophageal phase: Secondary | ICD-10-CM | POA: Diagnosis not present

## 2017-08-27 NOTE — Progress Notes (Signed)
Location:  [facility] Room Number: 213W Place of Service:  SNF (31)  Mariah Duos, MD  Patient Care Team: Mariah Duos, MD as PCP - General (Internal Medicine)  Extended Emergency Contact Information Primary Emergency Contact: Mariah [name] Address: [address]          [place],  [address] [name] of [place] Phone: [phone] Relation: Daughter    Allergies: Lyrica [pregabalin] and Penicillins  Chief Complaint  Patient presents with  . Medical Management of Chronic Issues    routine visit    HPI: Patient is 83 y.o. female who Is being for routine issues of atrial fibrillation, peripheral vascular disease, and GERD.  Past Medical History:  Diagnosis Date  . Acute encephalopathy 11/06/2015  . Atrial fibrillation ([place])   . CHF (congestive heart failure) ([place])   . Chronic diastolic CHF (congestive heart failure) ([place]) 09/09/2015  . Chronic respiratory failure with hypoxia ([place]) 11/06/2015  . Closed left hip fracture ([place]) 09/09/2015  . Closed pertrochanteric fracture ([place])   . COPD (chronic obstructive pulmonary disease) ([place])   . Dementia without behavioral disturbance 01/01/2016  . GERD (gastroesophageal reflux disease) 06/10/2016  . Glaucoma   . Hypertension   . Hypothyroidism 02/02/2017  . PVD (peripheral vascular disease) ([place]) 01/24/2016  . Speech abnormality 11/29/2015    Past Surgical History:  Procedure Laterality Date  . FEMUR IM NAIL Left 09/09/2015   Procedure: INTRAMEDULLARY (IM) NAIL FEMORAL;  Surgeon: Rod Can, MD;  Location: [facility];  Service: Orthopedics;  Laterality: Left;  . HERNIA REPAIR      Allergies as of 08/26/2017      Reactions   Lyrica [pregabalin] Other (See Comments)   Caused her to be lethargic and her legs felt as if she could not stand   Penicillins Rash      Medication List       Accurate as of 08/26/17 11:59 PM. Always use your most recent med list.            acetaminophen 325 MG tablet Commonly known as:  TYLENOL Take 650 mg by mouth every 4 (four) hours as needed for moderate pain.   aspirin 325 MG EC tablet Take 325 mg by mouth daily.   BIOFREEZE 4 % Gel Generic drug:  Menthol (Topical Analgesic) Apply topically. Apply sparingly to affected areas three times daily as needed for pain   brimonidine 0.1 % Soln Commonly known as:  ALPHAGAN P Place 1 drop into both eyes 2 (two) times daily.   carboxymethylcellulose 1 % ophthalmic solution Place 1 drop into both eyes 2 (two) times daily.   ENSURE PLUS Liqd Take 1 Can by mouth 2 (two) times daily between meals. Offer at bedtime and in between meals   gabapentin 300 MG capsule Commonly known as:  NEURONTIN Take 300 mg by mouth at bedtime.   guaiFENesin 600 MG 12 hr tablet Commonly known as:  MUCINEX Take 600 mg by mouth 2 (two) times daily.   HYDROcodone-acetaminophen 5-325 MG tablet Commonly known as:  NORCO/VICODIN Take 1 tablet by mouth every 4 (four) hours as needed for moderate pain or severe pain. No more than 3 grams of Tylenol in 24 hours   ICAPS AREDS 2 Caps Take 1 capsule by mouth 2 (two) times daily.   LASIX 40 MG tablet Generic drug:  furosemide Take 40 mg by mouth daily. Take 1 tab with 20 mg to equal 60 mg by mouth daily  furosemide 20 MG tablet Commonly known as:  LASIX Take 20 mg by mouth daily. Take 1 tab with 40 mg to equal 60 mg by mouth daily   latanoprost 0.005 % ophthalmic solution Commonly known as:  XALATAN Place 1 drop into both eyes at bedtime.   levothyroxine 75 MCG tablet Commonly known as:  SYNTHROID, LEVOTHROID Take 75 mcg by mouth daily before breakfast.   Melatonin 1 MG Tabs Take by mouth. Take 2 tablets ( 2 mg) at bedtime for sleep   metoprolol succinate 50 MG 24 hr tablet Commonly known as:  TOPROL-XL Take 50 mg by mouth daily. Take with or immediately following a meal.   mirtazapine 15 MG tablet Commonly known as:  REMERON Take  15 mg by mouth. Take 1/2 tablet at bedtime   pantoprazole 40 MG tablet Commonly known as:  PROTONIX Take 40 mg by mouth daily.   polyethylene glycol packet Commonly known as:  MIRALAX / GLYCOLAX Take 17 g by mouth every other day.   potassium chloride SA 20 MEQ tablet Commonly known as:  K-DUR,KLOR-CON Take 20 mEq by mouth daily.   spironolactone 25 MG tablet Commonly known as:  ALDACTONE Take 12.5 mg by mouth daily.   traMADol 50 MG tablet Commonly known as:  ULTRAM Take 1 tablet (50 mg total) by mouth at bedtime.   Vitamin D3 5000 units Caps Take 5,000 Units by mouth daily.       No orders of the defined types were placed in this encounter.   Immunization History  Administered Date(s) Administered  . Influenza, High Dose Seasonal PF 08/19/2014  . Influenza-Unspecified 08/25/2015, 08/16/2016  . PPD Test 10/31/2015, 11/17/2015  . Pneumococcal Conjugate-13 02/04/2015  . Pneumococcal Polysaccharide-23 01/17/2009  . Pneumococcal-Unspecified 01/17/2009  . Tdap 07/20/2013    Social History  Substance Use Topics  . Smoking status: Never Smoker  . Smokeless tobacco: Never Used  . Alcohol use No    Review of Systems  DATA OBTAINED: from patient-some participation; nursing-no concerns GENERAL:  no fevers, fatigue, appetite changes SKIN: No itching, rash HEENT: No complaint RESPIRATORY: No cough, wheezing, SOB CARDIAC: No chest pain, palpitations, lower extremity edema  GI: No abdominal pain, No N/V/D or constipation, No heartburn or reflux  GU: No dysuria, frequency or urgency, or incontinence  MUSCULOSKELETAL: No unrelieved bone/joint pain NEUROLOGIC: No headache, dizziness  PSYCHIATRIC: No overt anxiety or sadness  Vitals:   08/26/17 1552  BP: 124/62  Pulse: 80  Resp: 20  Temp: (!) 97.5 F (36.4 C)   Body mass index is 22.86 kg/m. Physical Exam  GENERAL APPEARANCE: Alert, conversant, No acute distress  SKIN: No diaphoresis rash HEENT:  Unremarkable RESPIRATORY: Breathing is even, unlabored. Lung sounds are clear   CARDIOVASCULAR: Heart RRR no murmurs, rubs or gallops. + peripheral edema  GASTROINTESTINAL: Abdomen is soft, non-tender, not distended w/ normal bowel sounds.  GENITOURINARY: Bladder non tender, not distended  MUSCULOSKELETAL: No abnormal joints or musculature NEUROLOGIC: Cranial nerves 2-12 grossly intact. Moves all extremities PSYCHIATRIC: Mood and affect with dementia, no behavioral issues  Patient Active Problem List   Diagnosis Date Noted  . Glaucoma 07/14/2017  . Macular degeneration 07/14/2017  . Hypothyroidism 02/02/2017  . Depression 07/05/2016  . GERD (gastroesophageal reflux disease) 06/10/2016  . Cerumen debris on tympanic membrane 03/14/2016  . PVD (peripheral vascular disease) ([place]) 01/24/2016  . Dementia without behavioral disturbance 01/01/2016  . Encounter for family conference with patient present 01/01/2016  . Aspiration of food 12/17/2015  .  Speech abnormality 11/29/2015  . Senile purpura ([place]) 11/29/2015  . Hip pain, acute 11/06/2015  . Acute encephalopathy 11/06/2015  . Bilateral lower extremity edema 11/06/2015  . UTI (urinary tract infection) 11/06/2015  . Chronic respiratory failure with hypoxia ([place]) 11/06/2015  . Closed left hip fracture ([place]) 09/09/2015  . Chronic diastolic CHF (congestive heart failure) ([place]) 09/09/2015  . COPD (chronic obstructive pulmonary disease) ([place])   . Atrial fibrillation ([place])   . Hypertensive heart disease with CHF (congestive heart failure) ([place])   . Chronic bronchitis ([place])   . Essential hypertension   . Fall   . Closed pertrochanteric fracture (HCC)     CMP     Component Value Date/Time   NA 141 03/27/2017   K 4.7 03/27/2017   CL 107 09/13/2015 0448   CO2 24 09/13/2015 0448   GLUCOSE 101 (H) 09/13/2015 0448   BUN 24 (A) 03/27/2017   CREATININE 0.9 03/27/2017   CREATININE 0.66 09/13/2015 0448   CALCIUM 8.7 (L) 09/13/2015 0448    PROT 6.9 09/08/2015 2325   ALBUMIN 4.1 09/08/2015 2325   AST 17 03/25/2017   ALT 6 (A) 03/25/2017   ALKPHOS 82 03/25/2017   BILITOT 1.3 (H) 09/08/2015 2325   GFRNONAA >60 09/13/2015 0448   GFRAA >60 09/13/2015 0448    Recent Labs  09/20/16 03/25/17 03/27/17  NA 141 139 141  K 4.0 5.8* 4.7  BUN 20 21 24*  CREATININE 0.9 0.7 0.9    Recent Labs  09/20/16 03/25/17  AST 21 17  ALT 8 6*  ALKPHOS 77 82    Recent Labs  09/20/16 03/25/17  WBC 4.7 9.3  HGB 13.7 14.8  HCT 42 46  PLT 143* 132*    Recent Labs  09/20/16  CHOL 149  LDLCALC 69  TRIG 58   No results found for: [facility] Lab Results  Component Value Date   TSH 1.13 03/25/2017   Lab Results  Component Value Date   HGBA1C 5.1 09/20/2016   Lab Results  Component Value Date   CHOL 149 09/20/2016   HDL 68 09/20/2016   LDLCALC 69 09/20/2016   TRIG 58 09/20/2016    Significant Diagnostic Results in last 30 days:  No results found.  Assessment and Plan  Atrial fibrillation (HCC) Stable; continue Toprol-XL 50 mg by mouth daily and ASA 325 mg by mouth daily  PVD (peripheral vascular disease) ([place]) [place] or breakdowns, patient has done very well; plan to continue ASA 325 mg by mouth daily  GERD (gastroesophageal reflux disease) Episodes of reflux or aspiration; continue Protonix 40 mg by mouth daily    Mariah Nightengale D. Sheppard Coil, MD

## 2017-08-29 ENCOUNTER — Encounter: Payer: Self-pay | Admitting: Internal Medicine

## 2017-08-29 NOTE — Assessment & Plan Note (Signed)
[place] or breakdowns, patient has done very well; plan to continue ASA 325 mg by mouth daily

## 2017-08-29 NOTE — Assessment & Plan Note (Signed)
Patient's had no recent or even sub-recent exacerbation; patient is controlled on guaifenesin 600 mg by mouth twice a day and this will continue

## 2017-08-29 NOTE — Assessment & Plan Note (Signed)
Stable; continue Toprol-XL 50 mg by mouth daily and ASA 325 mg by mouth daily

## 2017-08-29 NOTE — Assessment & Plan Note (Signed)
No reported exacerbation; plan to continue Aldactone 12.5 mg by mouth daily, Lasix 60 mg by mouth daily, and Toprol-XL 50 mg by mouth daily

## 2017-08-29 NOTE — Assessment & Plan Note (Signed)
Episodes of reflux or aspiration; continue Protonix 40 mg by mouth daily

## 2017-08-29 NOTE — Assessment & Plan Note (Signed)
Chronic and stable; plan to continue Aldactone 12.5 mg by mouth daily and Lasix 60 mg by mouth daily; patient will not wear TED hose and patient rarely if ever elevates her legs: We'll continue to encourage

## 2017-09-09 DIAGNOSIS — F0390 Unspecified dementia without behavioral disturbance: Secondary | ICD-10-CM | POA: Diagnosis not present

## 2017-09-09 DIAGNOSIS — F331 Major depressive disorder, recurrent, moderate: Secondary | ICD-10-CM | POA: Diagnosis not present

## 2017-09-09 DIAGNOSIS — G4700 Insomnia, unspecified: Secondary | ICD-10-CM | POA: Diagnosis not present

## 2017-09-24 ENCOUNTER — Non-Acute Institutional Stay (SKILLED_NURSING_FACILITY): Payer: Medicare Other | Admitting: Internal Medicine

## 2017-09-24 DIAGNOSIS — R400 Somnolence: Secondary | ICD-10-CM

## 2017-09-24 DIAGNOSIS — I739 Peripheral vascular disease, unspecified: Secondary | ICD-10-CM | POA: Diagnosis not present

## 2017-09-24 DIAGNOSIS — B351 Tinea unguium: Secondary | ICD-10-CM | POA: Diagnosis not present

## 2017-09-25 DIAGNOSIS — E039 Hypothyroidism, unspecified: Secondary | ICD-10-CM | POA: Diagnosis not present

## 2017-09-25 DIAGNOSIS — E559 Vitamin D deficiency, unspecified: Secondary | ICD-10-CM | POA: Diagnosis not present

## 2017-09-25 DIAGNOSIS — I10 Essential (primary) hypertension: Secondary | ICD-10-CM | POA: Diagnosis not present

## 2017-09-25 DIAGNOSIS — E785 Hyperlipidemia, unspecified: Secondary | ICD-10-CM | POA: Diagnosis not present

## 2017-09-25 DIAGNOSIS — E119 Type 2 diabetes mellitus without complications: Secondary | ICD-10-CM | POA: Diagnosis not present

## 2017-09-30 DIAGNOSIS — R1314 Dysphagia, pharyngoesophageal phase: Secondary | ICD-10-CM | POA: Diagnosis not present

## 2017-09-30 DIAGNOSIS — R1310 Dysphagia, unspecified: Secondary | ICD-10-CM | POA: Diagnosis not present

## 2017-10-01 ENCOUNTER — Non-Acute Institutional Stay (SKILLED_NURSING_FACILITY): Payer: Medicare Other | Admitting: Internal Medicine

## 2017-10-01 ENCOUNTER — Encounter: Payer: Self-pay | Admitting: Internal Medicine

## 2017-10-01 DIAGNOSIS — F0280 Dementia in other diseases classified elsewhere without behavioral disturbance: Secondary | ICD-10-CM | POA: Diagnosis not present

## 2017-10-01 DIAGNOSIS — G301 Alzheimer's disease with late onset: Secondary | ICD-10-CM | POA: Diagnosis not present

## 2017-10-01 DIAGNOSIS — I5032 Chronic diastolic (congestive) heart failure: Secondary | ICD-10-CM

## 2017-10-01 DIAGNOSIS — E034 Atrophy of thyroid (acquired): Secondary | ICD-10-CM | POA: Diagnosis not present

## 2017-10-01 DIAGNOSIS — I110 Hypertensive heart disease with heart failure: Secondary | ICD-10-CM

## 2017-10-01 NOTE — Progress Notes (Signed)
Location:  [facility] Room Number: 213W Place of Service:  SNF (31)  [name], MD  Patient Care Team: [name], MD as PCP - General (Internal Medicine)  Extended Emergency Contact Information Primary Emergency Contact: [name],[name] Address: [address]          [place], [place] [address] [name] of [place] Phone: [phone] Relation: Daughter    Allergies: Lyrica [pregabalin] and Penicillins  Chief Complaint  Patient presents with  . Medical Management of Chronic Issues    routine visit    HPI: Patient is 83 y.o. female who is being seen for routine issues of hypertension, hypothyroidism and dementia.  Past Medical History:  Diagnosis Date  . Acute encephalopathy 11/06/2015  . Atrial fibrillation ([place])   . CHF (congestive heart failure) ([place])   . Chronic diastolic CHF (congestive heart failure) ([place]) 09/09/2015  . Chronic respiratory failure with hypoxia ([place]) 11/06/2015  . Closed left hip fracture ([place]) 09/09/2015  . Closed pertrochanteric fracture ([place])   . COPD (chronic obstructive pulmonary disease) ([place])   . Dementia without behavioral disturbance 01/01/2016  . GERD (gastroesophageal reflux disease) 06/10/2016  . Glaucoma   . Hypertension   . Hypothyroidism 02/02/2017  . PVD (peripheral vascular disease) ([place]) 01/24/2016  . Speech abnormality 11/29/2015    Past Surgical History:  Procedure Laterality Date  . FEMUR IM NAIL Left 09/09/2015   Procedure: INTRAMEDULLARY (IM) NAIL FEMORAL;  Surgeon: Rod Can, MD;  Location: [facility];  Service: Orthopedics;  Laterality: Left;  . HERNIA REPAIR      Allergies as of 10/01/2017      Reactions   Lyrica [pregabalin] Other (See Comments)   Caused her to be lethargic and her legs felt as if she could not stand   Penicillins Rash      Medication List        Accurate as of 10/01/17 11:59 PM. Always use your most recent med list.            acetaminophen 325 MG tablet Commonly known as:  TYLENOL Take 650 mg by mouth every 4 (four) hours as needed for moderate pain.   aspirin 325 MG EC tablet Take 325 mg by mouth daily.   BIOFREEZE 4 % Gel Generic drug:  Menthol (Topical Analgesic) Apply topically. Apply sparingly to affected areas three times daily as needed for pain   brimonidine 0.1 % Soln Commonly known as:  ALPHAGAN P Place 1 drop into both eyes 2 (two) times daily.   carboxymethylcellulose 1 % ophthalmic solution Place 1 drop into both eyes 2 (two) times daily.   gabapentin 300 MG capsule Commonly known as:  NEURONTIN Take 300 mg by mouth at bedtime.   guaiFENesin 600 MG 12 hr tablet Commonly known as:  MUCINEX Take 600 mg by mouth 2 (two) times daily.   HYDROcodone-acetaminophen 5-325 MG tablet Commonly known as:  NORCO/VICODIN Take 1 tablet by mouth every 4 (four) hours as needed for moderate pain or severe pain. No more than 3 grams of Tylenol in 24 hours   ICAPS AREDS 2 Caps Take 1 capsule by mouth 2 (two) times daily.   LASIX 40 MG tablet Generic drug:  furosemide Take 40 mg by mouth daily. Take 1 tab with 20 mg to equal 60 mg by mouth daily   furosemide 20 MG tablet Commonly known as:  LASIX Take 20 mg by mouth daily. Take 1 tab with 40 mg to equal 60  mg by mouth daily   latanoprost 0.005 % ophthalmic solution Commonly known as:  XALATAN Place 1 drop into both eyes at bedtime.   levothyroxine 88 MCG tablet Commonly known as:  SYNTHROID, LEVOTHROID Take 88 mcg daily before breakfast by mouth.   Melatonin 1 MG Tabs Take by mouth. Take 2 tablets ( 2 mg) at bedtime for sleep   metoprolol tartrate 25 MG tablet Commonly known as:  LOPRESSOR Take 25 mg by mouth. Take 1/2 tablet twice daily   pantoprazole 40 MG tablet Commonly known as:  PROTONIX Take 40 mg by mouth daily.   polyethylene glycol packet Commonly known as:  MIRALAX / GLYCOLAX Take 17 g by mouth every other day.    potassium chloride SA 20 MEQ tablet Commonly known as:  K-DUR,KLOR-CON Take 20 mEq by mouth daily.   spironolactone 25 MG tablet Commonly known as:  ALDACTONE Take 12.5 mg by mouth daily.   traMADol 50 MG tablet Commonly known as:  ULTRAM Take 1 tablet (50 mg total) by mouth at bedtime.       No orders of the defined types were placed in this encounter.   Immunization History  Administered Date(s) Administered  . Influenza, High Dose Seasonal PF 08/19/2014  . Influenza-Unspecified 08/25/2015, 08/16/2016, 08/29/2017  . PPD Test 10/31/2015, 11/17/2015  . Pneumococcal Conjugate-13 02/04/2015  . Pneumococcal Polysaccharide-23 01/17/2009  . Pneumococcal-Unspecified 01/17/2009  . Tdap 07/20/2013    Social History   Tobacco Use  . Smoking status: Never Smoker  . Smokeless tobacco: Never Used  Substance Use Topics  . Alcohol use: No    Alcohol/week: 0.0 oz    Review of Systems  DATA OBTAINED: from patient-limited; nurse-no new concerns GENERAL:  no fevers, fatigue, appetite changes SKIN: No itching, rash HEENT: No complaint RESPIRATORY: No cough, wheezing, SOB CARDIAC: No chest pain, palpitations, lower extremity edema  GI: No abdominal pain, No N/V/D or constipation, No heartburn or reflux  GU: No dysuria, frequency or urgency, or incontinence  MUSCULOSKELETAL: No unrelieved bone/joint pain NEUROLOGIC: No headache, dizziness  PSYCHIATRIC: No overt anxiety or sadness  Vitals:   10/01/17 1628  BP: 110/70  Pulse: 66  Resp: 18  Temp: (!) 96.9 F (36.1 C)  SpO2: 98%   Body mass index is 24.75 kg/m. Physical Exam  GENERAL APPEARANCE: Alert, conversant, No acute distress  SKIN: No diaphoresis rash HEENT: Unremarkable RESPIRATORY: Breathing is even, unlabored. Lung sounds are clear   CARDIOVASCULAR: Heart RRR no murmurs, rubs or gallops. No peripheral edema  GASTROINTESTINAL: Abdomen is soft, non-tender, not distended w/ normal bowel sounds.  GENITOURINARY:  Bladder non tender, not distended  MUSCULOSKELETAL: No abnormal joints or musculature NEUROLOGIC: Cranial nerves 2-12 grossly intact. Moves all extremities PSYCHIATRIC: Mood and affect appropriate to situation with dementia, no behavioral issues  Patient Active Problem List   Diagnosis Date Noted  . Glaucoma 07/14/2017  . Macular degeneration 07/14/2017  . Hypothyroidism 02/02/2017  . Depression 07/05/2016  . GERD (gastroesophageal reflux disease) 06/10/2016  . Cerumen debris on tympanic membrane 03/14/2016  . PVD (peripheral vascular disease) ([place]) 01/24/2016  . Dementia without behavioral disturbance 01/01/2016  . Encounter for family conference with patient present 01/01/2016  . Aspiration of food 12/17/2015  . Speech abnormality 11/29/2015  . Senile purpura ([place]) 11/29/2015  . Hip pain, acute 11/06/2015  . Acute encephalopathy 11/06/2015  . Bilateral lower extremity edema 11/06/2015  . UTI (urinary tract infection) 11/06/2015  . Chronic respiratory failure with hypoxia ([place]) 11/06/2015  . Closed  left hip fracture ([place]) 09/09/2015  . Chronic diastolic CHF (congestive heart failure) ([place]) 09/09/2015  . COPD (chronic obstructive pulmonary disease) ([place])   . Atrial fibrillation ([place])   . Hypertensive heart disease with CHF (congestive heart failure) ([place])   . Chronic bronchitis ([place])   . Essential hypertension   . Fall   . Closed pertrochanteric fracture ([facility])     CMP     Component Value Date/Time   NA 141 03/27/2017   K 4.7 03/27/2017   CL 107 09/13/2015 0448   CO2 24 09/13/2015 0448   GLUCOSE 101 (H) 09/13/2015 0448   BUN 24 (A) 03/27/2017   CREATININE 0.9 03/27/2017   CREATININE 0.66 09/13/2015 0448   CALCIUM 8.7 (L) 09/13/2015 0448   PROT 6.9 09/08/2015 2325   ALBUMIN 4.1 09/08/2015 2325   AST 17 03/25/2017   ALT 6 (A) 03/25/2017   ALKPHOS 82 03/25/2017   BILITOT 1.3 (H) 09/08/2015 2325   GFRNONAA >60 09/13/2015 0448   GFRAA >60 09/13/2015 0448   Recent  Labs    03/25/17 03/27/17  NA 139 141  K 5.8* 4.7  BUN 21 24*  CREATININE 0.7 0.9   Recent Labs    03/25/17  AST 17  ALT 6*  ALKPHOS 82   Recent Labs    03/25/17  WBC 9.3  HGB 14.8  HCT 46  PLT 132*   No results for input(s): CHOL, LDLCALC, TRIG in the last 8760 hours.  Invalid input(s): HCL No results found for: [facility] Lab Results  Component Value Date   TSH 1.13 03/25/2017   Lab Results  Component Value Date   HGBA1C 5.1 09/20/2016   Lab Results  Component Value Date   CHOL 149 09/20/2016   HDL 68 09/20/2016   LDLCALC 69 09/20/2016   TRIG 58 09/20/2016    Significant Diagnostic Results in last 30 days:  No results found.  Assessment and Plan  Hypertensive heart disease with CHF (congestive heart failure) (HCC) Control; plan to continue Lasix 60 mg daily, Aldactone 12.5 mg by mouth daily and metoprolol 12.5 mg by mouth twice a day  Hypothyroidism TSH 1.13; plan to continue Synthroid 75 g by mouth daily  Dementia without behavioral disturbance Stable no declines; patient continues out of her room daily; plan to continue supportive care     Webb Silversmith D. Sheppard Coil, MD

## 2017-10-06 ENCOUNTER — Encounter: Payer: Self-pay | Admitting: Internal Medicine

## 2017-10-06 NOTE — Progress Notes (Signed)
Location:   [facility] facility  [name], MD  Patient Care Team: [name], MD as PCP - General (Internal Medicine)  Extended Emergency Contact Information Primary Emergency Contact: [name],[name] Address: [address]          [place], [place] [address] [name] of [place] Phone: [phone] Relation: Daughter    Allergies: Lyrica [pregabalin] and Penicillins  Chief Complaint  Patient presents with  . Acute Visit    HPI: Patient is 83 y.o. female who nursing asked me to see because she's been more sleepy than usual, especially this morning. Patient arouses and speaks to me when I question her. Patient has no complaints of pain shortness of breath nausea vomiting. Per nursing patient's had no fever or cough.  Past Medical History:  Diagnosis Date  . Acute encephalopathy 11/06/2015  . Atrial fibrillation ([place])   . CHF (congestive heart failure) ([place])   . Chronic diastolic CHF (congestive heart failure) ([place]) 09/09/2015  . Chronic respiratory failure with hypoxia ([place]) 11/06/2015  . Closed left hip fracture ([place]) 09/09/2015  . Closed pertrochanteric fracture ([place])   . COPD (chronic obstructive pulmonary disease) ([place])   . Dementia without behavioral disturbance 01/01/2016  . GERD (gastroesophageal reflux disease) 06/10/2016  . Glaucoma   . Hypertension   . Hypothyroidism 02/02/2017  . PVD (peripheral vascular disease) ([place]) 01/24/2016  . Speech abnormality 11/29/2015    Past Surgical History:  Procedure Laterality Date  . HERNIA REPAIR    . INTRAMEDULLARY (IM) NAIL FEMORAL Left 09/09/2015   Performed by Rod Can, MD at [facility] OR[facility]    Allergies as of 09/24/2017      Reactions   Lyrica [pregabalin] Other (See Comments)   Caused her to be lethargic and her legs felt as if she could not stand   Penicillins Rash      Medication List        Accurate as of 09/24/17 11:59 PM. Always use your most  recent med list.          acetaminophen 325 MG tablet Commonly known as:  TYLENOL Take 650 mg by mouth every 4 (four) hours as needed for moderate pain.   aspirin 325 MG EC tablet Take 325 mg by mouth daily.   BIOFREEZE 4 % Gel Generic drug:  Menthol (Topical Analgesic) Apply topically. Apply sparingly to affected areas three times daily as needed for pain   brimonidine 0.1 % Soln Commonly known as:  ALPHAGAN P Place 1 drop into both eyes 2 (two) times daily.   carboxymethylcellulose 1 % ophthalmic solution Place 1 drop into both eyes 2 (two) times daily.   gabapentin 300 MG capsule Commonly known as:  NEURONTIN Take 300 mg by mouth at bedtime.   guaiFENesin 600 MG 12 hr tablet Commonly known as:  MUCINEX Take 600 mg by mouth 2 (two) times daily.   HYDROcodone-acetaminophen 5-325 MG tablet Commonly known as:  NORCO/VICODIN Take 1 tablet by mouth every 4 (four) hours as needed for moderate pain or severe pain. No more than 3 grams of Tylenol in 24 hours   ICAPS AREDS 2 Caps Take 1 capsule by mouth 2 (two) times daily.   LASIX 40 MG tablet Generic drug:  furosemide Take 40 mg by mouth daily. Take 1 tab with 20 mg to equal 60 mg by mouth daily   furosemide 20 MG tablet Commonly known as:  LASIX Take 20 mg by mouth  daily. Take 1 tab with 40 mg to equal 60 mg by mouth daily   latanoprost 0.005 % ophthalmic solution Commonly known as:  XALATAN Place 1 drop into both eyes at bedtime.   Melatonin 1 MG Tabs Take by mouth. Take 2 tablets ( 2 mg) at bedtime for sleep   pantoprazole 40 MG tablet Commonly known as:  PROTONIX Take 40 mg by mouth daily.   polyethylene glycol packet Commonly known as:  MIRALAX / GLYCOLAX Take 17 g by mouth every other day.   potassium chloride SA 20 MEQ tablet Commonly known as:  K-DUR,KLOR-CON Take 20 mEq by mouth daily.   spironolactone 25 MG tablet Commonly known as:  ALDACTONE Take 12.5 mg by mouth daily.   traMADol 50 MG  tablet Commonly known as:  ULTRAM Take 1 tablet (50 mg total) by mouth at bedtime.       No orders of the defined types were placed in this encounter.   Immunization History  Administered Date(s) Administered  . Influenza, High Dose Seasonal PF 08/19/2014  . Influenza-Unspecified 08/25/2015, 08/16/2016, 08/29/2017  . PPD Test 10/31/2015, 11/17/2015  . Pneumococcal Conjugate-13 02/04/2015  . Pneumococcal Polysaccharide-23 01/17/2009  . Pneumococcal-Unspecified 01/17/2009  . Tdap 07/20/2013    Social History   Tobacco Use  . Smoking status: Never Smoker  . Smokeless tobacco: Never Used  Substance Use Topics  . Alcohol use: No    Alcohol/week: 0.0 oz    Review of Systems  DATA OBTAINED: from patient, nurse GENERAL:  no fevers, fatigue, appetite changes SKIN: No itching, rash HEENT: No complaint RESPIRATORY: No cough, wheezing, SOB CARDIAC: No chest pain, palpitations, lower extremity edema  GI: No abdominal pain, No N/V/D or constipation, No heartburn or reflux  GU: No dysuria, frequency or urgency, or incontinence  MUSCULOSKELETAL: No unrelieved bone/joint pain NEUROLOGIC: No headache, dizziness  PSYCHIATRIC: No overt anxiety or sadness  Vitals:   10/06/17 1843  BP: 124/62  Pulse: 80  Resp: 20  Temp: (!) 97.2 F (36.2 C)   Body mass index is 22.86 kg/m. Physical Exam  GENERAL APPEARANCE: Sleepy, minimally conversant, No acute distress  SKIN: No diaphoresis rash HEENT: Unremarkable RESPIRATORY: Breathing is even, unlabored. Lung sounds are clear   CARDIOVASCULAR: Heart RRR no murmurs, rubs or gallops. No peripheral edema  GASTROINTESTINAL: Abdomen is soft, non-tender, not distended w/ normal bowel sounds.  GENITOURINARY: Bladder non tender, not distended  MUSCULOSKELETAL: No abnormal joints or musculature NEUROLOGIC: Cranial nerves 2-12 grossly intact. Moves all extremities PSYCHIATRIC: Mood and affect appropriate with dementia, no behavioral  issues  Patient Active Problem List   Diagnosis Date Noted  . Glaucoma 07/14/2017  . Macular degeneration 07/14/2017  . Hypothyroidism 02/02/2017  . Depression 07/05/2016  . GERD (gastroesophageal reflux disease) 06/10/2016  . Cerumen debris on tympanic membrane 03/14/2016  . PVD (peripheral vascular disease) ([place]) 01/24/2016  . Dementia without behavioral disturbance 01/01/2016  . Encounter for family conference with patient present 01/01/2016  . Aspiration of food 12/17/2015  . Speech abnormality 11/29/2015  . Senile purpura ([place]) 11/29/2015  . Hip pain, acute 11/06/2015  . Acute encephalopathy 11/06/2015  . Bilateral lower extremity edema 11/06/2015  . UTI (urinary tract infection) 11/06/2015  . Chronic respiratory failure with hypoxia ([place]) 11/06/2015  . Closed left hip fracture ([place]) 09/09/2015  . Chronic diastolic CHF (congestive heart failure) ([place]) 09/09/2015  . COPD (chronic obstructive pulmonary disease) ([place])   . Atrial fibrillation ([place])   . Hypertensive heart disease with CHF (congestive  heart failure) ([place])   . Chronic bronchitis ([place])   . Essential hypertension   . Fall   . Closed pertrochanteric fracture ([facility])     CMP     Component Value Date/Time   NA 141 03/27/2017   K 4.7 03/27/2017   CL 107 09/13/2015 0448   CO2 24 09/13/2015 0448   GLUCOSE 101 (H) 09/13/2015 0448   BUN 24 (A) 03/27/2017   CREATININE 0.9 03/27/2017   CREATININE 0.66 09/13/2015 0448   CALCIUM 8.7 (L) 09/13/2015 0448   PROT 6.9 09/08/2015 2325   ALBUMIN 4.1 09/08/2015 2325   AST 17 03/25/2017   ALT 6 (A) 03/25/2017   ALKPHOS 82 03/25/2017   BILITOT 1.3 (H) 09/08/2015 2325   GFRNONAA >60 09/13/2015 0448   GFRAA >60 09/13/2015 0448   Recent Labs    03/25/17 03/27/17  NA 139 141  K 5.8* 4.7  BUN 21 24*  CREATININE 0.7 0.9   Recent Labs    03/25/17  AST 17  ALT 6*  ALKPHOS 82   Recent Labs    03/25/17  WBC 9.3  HGB 14.8  HCT 46  PLT 132*   No results for input(s):  CHOL, LDLCALC, TRIG in the last 8760 hours.  Invalid input(s): HCL No results found for: [facility] Lab Results  Component Value Date   TSH 1.13 03/25/2017   Lab Results  Component Value Date   HGBA1C 5.1 09/20/2016   Lab Results  Component Value Date   CHOL 149 09/20/2016   HDL 68 09/20/2016   LDLCALC 69 09/20/2016   TRIG 58 09/20/2016    Significant Diagnostic Results in last 30 days:  No results found.  Assessment and Plan  SOMNOLENCE- have reviewed patient's medications blood pressure pulse; systolic blood pressure today is 110, patient is on Toprol-XL 50 mg daily we'll change to 12.5 mg twice a day, overall routine labs including a TSH; will continue to monitor  Later entry-patient is up and out of bed acting like her usual self     [name], MD

## 2017-10-12 ENCOUNTER — Encounter: Payer: Self-pay | Admitting: Internal Medicine

## 2017-10-12 NOTE — Assessment & Plan Note (Signed)
TSH 1.13; plan to continue Synthroid 75 g by mouth daily

## 2017-10-12 NOTE — Assessment & Plan Note (Signed)
Stable no declines; patient continues out of her room daily; plan to continue supportive care

## 2017-10-12 NOTE — Assessment & Plan Note (Signed)
Control; plan to continue Lasix 60 mg daily, Aldactone 12.5 mg by mouth daily and metoprolol 12.5 mg by mouth twice a day

## 2017-10-15 DIAGNOSIS — E119 Type 2 diabetes mellitus without complications: Secondary | ICD-10-CM | POA: Diagnosis not present

## 2017-10-15 DIAGNOSIS — E785 Hyperlipidemia, unspecified: Secondary | ICD-10-CM | POA: Diagnosis not present

## 2017-10-15 DIAGNOSIS — E559 Vitamin D deficiency, unspecified: Secondary | ICD-10-CM | POA: Diagnosis not present

## 2017-10-15 DIAGNOSIS — I10 Essential (primary) hypertension: Secondary | ICD-10-CM | POA: Diagnosis not present

## 2017-10-15 DIAGNOSIS — E039 Hypothyroidism, unspecified: Secondary | ICD-10-CM | POA: Diagnosis not present

## 2017-10-15 LAB — HEMOGLOBIN A1C: HEMOGLOBIN A1C: 5.1

## 2017-10-15 LAB — HEPATIC FUNCTION PANEL
ALT: 5 — AB (ref 7–35)
AST: 13 (ref 13–35)
Alkaline Phosphatase: 78 (ref 25–125)
Bilirubin, Total: 0.8

## 2017-10-15 LAB — BASIC METABOLIC PANEL
BUN: 10 (ref 4–21)
CREATININE: 0.7 (ref 0.5–1.1)
Glucose: 79
POTASSIUM: 4.3 (ref 3.4–5.3)
SODIUM: 140 (ref 137–147)

## 2017-10-15 LAB — LIPID PANEL
CHOLESTEROL: 137 (ref 0–200)
HDL: 60 (ref 35–70)
LDL Cholesterol: 65
TRIGLYCERIDES: 61 (ref 40–160)

## 2017-10-15 LAB — CBC AND DIFFERENTIAL
HEMATOCRIT: 41 (ref 36–46)
HEMOGLOBIN: 13.3 (ref 12.0–16.0)
Platelets: 146 — AB (ref 150–399)
WBC: 6

## 2017-10-15 LAB — TSH: TSH: 3.04 (ref 0.41–5.90)

## 2017-10-15 LAB — VITAMIN D 25 HYDROXY (VIT D DEFICIENCY, FRACTURES): Vit D, 25-Hydroxy: 60

## 2017-10-17 DIAGNOSIS — F0390 Unspecified dementia without behavioral disturbance: Secondary | ICD-10-CM | POA: Diagnosis not present

## 2017-10-17 DIAGNOSIS — G4700 Insomnia, unspecified: Secondary | ICD-10-CM | POA: Diagnosis not present

## 2017-10-17 DIAGNOSIS — F331 Major depressive disorder, recurrent, moderate: Secondary | ICD-10-CM | POA: Diagnosis not present

## 2017-10-18 ENCOUNTER — Other Ambulatory Visit: Payer: Self-pay

## 2017-10-30 ENCOUNTER — Encounter: Payer: Self-pay | Admitting: Internal Medicine

## 2017-10-30 ENCOUNTER — Non-Acute Institutional Stay (SKILLED_NURSING_FACILITY): Payer: Medicare Other | Admitting: Internal Medicine

## 2017-10-30 DIAGNOSIS — I480 Paroxysmal atrial fibrillation: Secondary | ICD-10-CM

## 2017-10-30 DIAGNOSIS — I5032 Chronic diastolic (congestive) heart failure: Secondary | ICD-10-CM | POA: Diagnosis not present

## 2017-10-30 DIAGNOSIS — I739 Peripheral vascular disease, unspecified: Secondary | ICD-10-CM | POA: Diagnosis not present

## 2017-10-30 NOTE — Progress Notes (Signed)
Location:  [facility] Room Number: 213W Place of Service:  SNF (31)  Mariah Duos, MD  Patient Care Team: Mariah Duos, MD as PCP - General (Internal Medicine)  Extended Emergency Contact Information Primary Emergency Contact: [name],[name] Address: [address]          [place], [place] [address] [name] of [place] Phone: [phone] Relation: Daughter    Allergies: Lyrica [pregabalin] and Penicillins  Chief Complaint  Patient presents with  . Medical Management of Chronic Issues    routine visit    HPI: Patient is 83 y.o. female who is being seen for routine issues of chronic diastolic congestive heart failure, peripheral vascular disease, and atrial fib.  Past Medical History:  Diagnosis Date  . Acute encephalopathy 11/06/2015  . Atrial fibrillation ([place])   . CHF (congestive heart failure) ([place])   . Chronic diastolic CHF (congestive heart failure) ([place]) 09/09/2015  . Chronic respiratory failure with hypoxia ([place]) 11/06/2015  . Closed left hip fracture ([place]) 09/09/2015  . Closed pertrochanteric fracture ([place])   . COPD (chronic obstructive pulmonary disease) ([place])   . Dementia without behavioral disturbance 01/01/2016  . GERD (gastroesophageal reflux disease) 06/10/2016  . Glaucoma   . Hypertension   . Hypothyroidism 02/02/2017  . PVD (peripheral vascular disease) ([place]) 01/24/2016  . Speech abnormality 11/29/2015    Past Surgical History:  Procedure Laterality Date  . FEMUR IM NAIL Left 09/09/2015   Procedure: INTRAMEDULLARY (IM) NAIL FEMORAL;  Surgeon: Rod Can, MD;  Location: [facility];  Service: Orthopedics;  Laterality: Left;  . HERNIA REPAIR      Allergies as of 10/30/2017      Reactions   Lyrica [pregabalin] Other (See Comments)   Caused her to be lethargic and her legs felt as if she could not stand   Penicillins Rash      Medication List        Accurate as of 10/30/17 11:59 PM. Always use your  most recent med list.          acetaminophen 325 MG tablet Commonly known as:  TYLENOL Take 650 mg by mouth every 4 (four) hours as needed for moderate pain.   aspirin 325 MG EC tablet Take 325 mg by mouth daily.   BIOFREEZE 4 % Gel Generic drug:  Menthol (Topical Analgesic) Apply topically. Apply sparingly to affected areas three times daily as needed for pain   brimonidine 0.1 % Soln Commonly known as:  ALPHAGAN P Place 1 drop into both eyes 2 (two) times daily.   carboxymethylcellulose 1 % ophthalmic solution Place 1 drop into both eyes 2 (two) times daily.   gabapentin 300 MG capsule Commonly known as:  NEURONTIN Take 300 mg by mouth at bedtime.   guaiFENesin 600 MG 12 hr tablet Commonly known as:  MUCINEX Take 600 mg by mouth 2 (two) times daily.   HYDROcodone-acetaminophen 5-325 MG tablet Commonly known as:  NORCO/VICODIN Take 1 tablet by mouth every 4 (four) hours as needed for moderate pain or severe pain. No more than 3 grams of Tylenol in 24 hours   ICAPS AREDS 2 Caps Take 1 capsule by mouth 2 (two) times daily.   LASIX 40 MG tablet Generic drug:  furosemide Take 40 mg by mouth daily. Take 1 tab with 20 mg to equal 60 mg by mouth daily   furosemide 20 MG tablet Commonly known as:  LASIX Take 20 mg by mouth daily. Take 1 tab  with 40 mg to equal 60 mg by mouth daily   latanoprost 0.005 % ophthalmic solution Commonly known as:  XALATAN Place 1 drop into both eyes at bedtime.   levothyroxine 88 MCG tablet Commonly known as:  SYNTHROID, LEVOTHROID Take 88 mcg daily before breakfast by mouth.   Melatonin 1 MG Tabs Take 2 mg by mouth at bedtime.   metoprolol tartrate 25 MG tablet Commonly known as:  LOPRESSOR Take 12.5 mg by mouth 2 (two) times daily.   pantoprazole 40 MG tablet Commonly known as:  PROTONIX Take 40 mg by mouth daily.   polyethylene glycol packet Commonly known as:  MIRALAX / GLYCOLAX Take 17 g by mouth every other day.     potassium chloride SA 20 MEQ tablet Commonly known as:  K-DUR,KLOR-CON Take 20 mEq by mouth daily.   spironolactone 25 MG tablet Commonly known as:  ALDACTONE Take 12.5 mg by mouth daily.   traMADol 50 MG tablet Commonly known as:  ULTRAM Take 1 tablet (50 mg total) by mouth at bedtime.       No orders of the defined types were placed in this encounter.   Immunization History  Administered Date(s) Administered  . Influenza, High Dose Seasonal PF 08/19/2014  . Influenza-Unspecified 08/25/2015, 08/16/2016, 08/29/2017  . PPD Test 10/31/2015, 11/17/2015  . Pneumococcal Conjugate-13 02/04/2015  . Pneumococcal Polysaccharide-23 01/17/2009  . Pneumococcal-Unspecified 01/17/2009  . Tdap 07/20/2013    Social History   Tobacco Use  . Smoking status: Never Smoker  . Smokeless tobacco: Never Used  Substance Use Topics  . Alcohol use: No    Alcohol/week: 0.0 oz    Review of Systems  DATA OBTAINED: from patient-limited; nursing-no concerns GENERAL:  no fevers, fatigue, appetite changes SKIN: No itching, rash HEENT: No complaint RESPIRATORY: No cough, wheezing, SOB CARDIAC: No chest pain, palpitations, lower extremity edema  GI: No abdominal pain, No N/V/D or constipation, No heartburn or reflux  GU: No dysuria, frequency or urgency, or incontinence  MUSCULOSKELETAL: No unrelieved bone/joint pain NEUROLOGIC: No headache, dizziness  PSYCHIATRIC: No overt anxiety or sadness  Vitals:   10/30/17 2240  BP: 120/85  Pulse: 80  Resp: 18  Temp: (!) 97.5 F (36.4 C)  SpO2: 93%   Body mass index is 23.92 kg/m. Physical Exam  GENERAL APPEARANCE: Alert, conversant, No acute distress  SKIN: No diaphoresis rash HEENT: Unremarkable RESPIRATORY: Breathing is even, unlabored. Lung sounds are clear   CARDIOVASCULAR: Heart RRR no murmurs, rubs or gallops. No peripheral edema  GASTROINTESTINAL: Abdomen is soft, non-tender, not distended w/ normal bowel sounds.  GENITOURINARY:  Bladder non tender, not distended  MUSCULOSKELETAL: No abnormal joints or musculature NEUROLOGIC: Cranial nerves 2-12 grossly intact. Moves all extremities PSYCHIATRIC: Mood and affect with dementia, no behavioral issues  Patient Active Problem List   Diagnosis Date Noted  . Glaucoma 07/14/2017  . Macular degeneration 07/14/2017  . Hypothyroidism 02/02/2017  . Depression 07/05/2016  . GERD (gastroesophageal reflux disease) 06/10/2016  . Cerumen debris on tympanic membrane 03/14/2016  . PVD (peripheral vascular disease) ([place]) 01/24/2016  . Dementia without behavioral disturbance 01/01/2016  . Encounter for family conference with patient present 01/01/2016  . Aspiration of food 12/17/2015  . Speech abnormality 11/29/2015  . Senile purpura ([place]) 11/29/2015  . Hip pain, acute 11/06/2015  . Acute encephalopathy 11/06/2015  . Bilateral lower extremity edema 11/06/2015  . UTI (urinary tract infection) 11/06/2015  . Chronic respiratory failure with hypoxia ([place]) 11/06/2015  . Closed left hip fracture ([place])  09/09/2015  . Chronic diastolic CHF (congestive heart failure) ([place]) 09/09/2015  . COPD (chronic obstructive pulmonary disease) ([place])   . Atrial fibrillation ([place])   . Hypertensive heart disease with CHF (congestive heart failure) ([place])   . Chronic bronchitis ([place])   . Essential hypertension   . Fall   . Closed pertrochanteric fracture ([facility])     CMP     Component Value Date/Time   NA 140 10/15/2017   K 4.3 10/15/2017   CL 107 09/13/2015 0448   CO2 24 09/13/2015 0448   GLUCOSE 101 (H) 09/13/2015 0448   BUN 10 10/15/2017   CREATININE 0.7 10/15/2017   CREATININE 0.66 09/13/2015 0448   CALCIUM 8.7 (L) 09/13/2015 0448   PROT 6.9 09/08/2015 2325   ALBUMIN 4.1 09/08/2015 2325   AST 13 10/15/2017   ALT 5 (A) 10/15/2017   ALKPHOS 78 10/15/2017   BILITOT 1.3 (H) 09/08/2015 2325   GFRNONAA >60 09/13/2015 0448   GFRAA >60 09/13/2015 0448   Recent Labs    03/25/17 03/27/17  10/15/17  NA 139 141 140  K 5.8* 4.7 4.3  BUN 21 24* 10  CREATININE 0.7 0.9 0.7   Recent Labs    03/25/17 10/15/17  AST 17 13  ALT 6* 5*  ALKPHOS 82 78   Recent Labs    03/25/17 10/15/17  WBC 9.3 6.0  HGB 14.8 13.3  HCT 46 41  PLT 132* 146*   Recent Labs    10/15/17  CHOL 137  LDLCALC 65  TRIG 61   No results found for: [facility] Lab Results  Component Value Date   TSH 3.04 10/15/2017   Lab Results  Component Value Date   HGBA1C 5.1 10/15/2017   Lab Results  Component Value Date   CHOL 137 10/15/2017   HDL 60 10/15/2017   LDLCALC 65 10/15/2017   TRIG 61 10/15/2017    Significant Diagnostic Results in last 30 days:  No results found.  Assessment and Plan  PVD (peripheral vascular disease) ([place]) No skin breakdown; patient is doing well on ASA 325 mg by mouth daily as prophylaxis  Chronic diastolic CHF (congestive heart failure) ([facility]) No exacerbations; patient's unwell; plan to continue Lasix 60 mg by mouth daily Aldactone 12.5 mg by mouth daily and metoprolol 12.5 milligrams twice a day  Atrial fibrillation ([facility]) Stable; continue metoprolol 12.5 mg by mouth twice a day and prophylaxis with ASA 325 mg by mouth daily    Abdulmalik Darco D. Sheppard Coil, MD

## 2017-10-31 ENCOUNTER — Other Ambulatory Visit: Payer: Self-pay | Admitting: Internal Medicine

## 2017-11-28 ENCOUNTER — Encounter: Payer: Self-pay | Admitting: Internal Medicine

## 2017-11-28 ENCOUNTER — Non-Acute Institutional Stay (SKILLED_NURSING_FACILITY): Payer: Medicare Other | Admitting: Internal Medicine

## 2017-11-28 DIAGNOSIS — J42 Unspecified chronic bronchitis: Secondary | ICD-10-CM | POA: Diagnosis not present

## 2017-11-28 DIAGNOSIS — I110 Hypertensive heart disease with heart failure: Secondary | ICD-10-CM | POA: Diagnosis not present

## 2017-11-28 DIAGNOSIS — K219 Gastro-esophageal reflux disease without esophagitis: Secondary | ICD-10-CM | POA: Diagnosis not present

## 2017-11-28 DIAGNOSIS — I5032 Chronic diastolic (congestive) heart failure: Secondary | ICD-10-CM

## 2017-11-28 NOTE — Progress Notes (Signed)
Location:  [facility] Room Number: 213-W Place of Service:  SNF (31)  [name], MD  Patient Care Team: [name], MD as PCP - General (Internal Medicine)  Extended Emergency Contact Information Primary Emergency Contact: [name],[name] Address: [address]          [place], [place] [address] [name] of [place] Phone: [phone] Relation: Daughter    Allergies: Lyrica [pregabalin] and Penicillins  Chief Complaint  Patient presents with  . Medical Management of Chronic Issues    Routine Visit     HPI: Patient is 84 y.o. female who is being seen for routine issues of hypertension, COPD, and GERD.  Past Medical History:  Diagnosis Date  . Acute encephalopathy 11/06/2015  . Atrial fibrillation ([place])   . CHF (congestive heart failure) ([place])   . Chronic diastolic CHF (congestive heart failure) ([place]) 09/09/2015  . Chronic respiratory failure with hypoxia ([place]) 11/06/2015  . Closed left hip fracture ([place]) 09/09/2015  . Closed pertrochanteric fracture ([place])   . COPD (chronic obstructive pulmonary disease) ([place])   . Dementia without behavioral disturbance 01/01/2016  . GERD (gastroesophageal reflux disease) 06/10/2016  . Glaucoma   . Hypertension   . Hypothyroidism 02/02/2017  . PVD (peripheral vascular disease) ([place]) 01/24/2016  . Speech abnormality 11/29/2015    Past Surgical History:  Procedure Laterality Date  . FEMUR IM NAIL Left 09/09/2015   Procedure: INTRAMEDULLARY (IM) NAIL FEMORAL;  Surgeon: Rod Can, MD;  Location: [facility];  Service: Orthopedics;  Laterality: Left;  . HERNIA REPAIR      Allergies as of 11/28/2017      Reactions   Lyrica [pregabalin] Other (See Comments)   Caused her to be lethargic and her legs felt as if she could not stand   Penicillins Rash      Medication List        Accurate as of 11/28/17 11:59 PM. Always use your most recent med list.          acetaminophen 325 MG  tablet Commonly known as:  TYLENOL Take 650 mg by mouth every 4 (four) hours as needed for moderate pain.   aspirin 325 MG EC tablet Take 325 mg by mouth daily.   BIOFREEZE 4 % Gel Generic drug:  Menthol (Topical Analgesic) Apply topically. Apply sparingly to affected areas three times daily as needed for pain   brimonidine 0.1 % Soln Commonly known as:  ALPHAGAN P Place 1 drop into both eyes 2 (two) times daily.   carboxymethylcellulose 1 % ophthalmic solution Place 1 drop into both eyes 2 (two) times daily.   gabapentin 300 MG capsule Commonly known as:  NEURONTIN Take 300 mg by mouth at bedtime.   guaiFENesin 600 MG 12 hr tablet Commonly known as:  MUCINEX Take 600 mg by mouth 2 (two) times daily.   HYDROcodone-acetaminophen 5-325 MG tablet Commonly known as:  NORCO/VICODIN Take 1 tablet by mouth every 4 (four) hours as needed for moderate pain or severe pain. No more than 3 grams of Tylenol in 24 hours   ICAPS AREDS 2 Caps Take 1 capsule by mouth 2 (two) times daily.   LASIX 40 MG tablet Generic drug:  furosemide Take 40 mg by mouth daily. Take 1 tab with 20 mg to equal 60 mg by mouth daily   furosemide 20 MG tablet Commonly known as:  LASIX Take 20 mg by mouth daily. Take 1 tab with 40 mg to equal 60  mg by mouth daily   latanoprost 0.005 % ophthalmic solution Commonly known as:  XALATAN Place 1 drop into both eyes at bedtime.   levothyroxine 88 MCG tablet Commonly known as:  SYNTHROID, LEVOTHROID Take 88 mcg daily before breakfast by mouth.   Melatonin 1 MG Tabs Take 2 mg by mouth at bedtime.   metoprolol tartrate 25 MG tablet Commonly known as:  LOPRESSOR Take 12.5 mg by mouth 2 (two) times daily.   pantoprazole 40 MG tablet Commonly known as:  PROTONIX Take 40 mg by mouth daily.   polyethylene glycol packet Commonly known as:  MIRALAX / GLYCOLAX Take 17 g by mouth every other day.   potassium chloride SA 20 MEQ tablet Commonly known as:   K-DUR,KLOR-CON Take 20 mEq by mouth daily.   spironolactone 25 MG tablet Commonly known as:  ALDACTONE Take 12.5 mg by mouth daily.   traMADol 50 MG tablet Commonly known as:  ULTRAM Take 1 tablet (50 mg total) by mouth at bedtime.       No orders of the defined types were placed in this encounter.   Immunization History  Administered Date(s) Administered  . Influenza, High Dose Seasonal PF 08/19/2014  . Influenza-Unspecified 08/25/2015, 08/16/2016, 08/29/2017  . PPD Test 10/31/2015, 11/17/2015  . Pneumococcal Conjugate-13 02/04/2015  . Pneumococcal Polysaccharide-23 01/17/2009  . Pneumococcal-Unspecified 01/17/2009  . Tdap 07/20/2013    Social History   Tobacco Use  . Smoking status: Never Smoker  . Smokeless tobacco: Never Used  Substance Use Topics  . Alcohol use: No    Alcohol/week: 0.0 oz    Review of Systems  DATA OBTAINED: from patient-limited; nursing-no acute concerns GENERAL:  no fevers, fatigue, appetite changes SKIN: No itching, rash HEENT: No complaint RESPIRATORY: No cough, wheezing, SOB CARDIAC: No chest pain, palpitations, lower extremity edema  GI: No abdominal pain, No N/V/D or constipation, No heartburn or reflux  GU: No dysuria, frequency or urgency, or incontinence  MUSCULOSKELETAL: No unrelieved bone/joint pain NEUROLOGIC: No headache, dizziness  PSYCHIATRIC: No overt anxiety or sadness  Vitals:   11/28/17 1102  BP: 121/74  Pulse: 71  Resp: 18  Temp: 98 F (36.7 C)  SpO2: 99%   Body mass index is 22.37 kg/m. Physical Exam  GENERAL APPEARANCE: Alert, conversant, No acute distress  SKIN: No diaphoresis rash HEENT: Unremarkable RESPIRATORY: Breathing is even, unlabored. Lung sounds are clear   CARDIOVASCULAR: Heart RRR no murmurs, rubs or gallops. Trace peripheral edema  GASTROINTESTINAL: Abdomen is soft, non-tender, not distended w/ normal bowel sounds.  GENITOURINARY: Bladder non tender, not distended  MUSCULOSKELETAL: No  abnormal joints or musculature NEUROLOGIC: Cranial nerves 2-12 grossly intact. Moves all extremities PSYCHIATRIC: Mood and affect appropriate . Dementia, no behavioral issues  Patient Active Problem List   Diagnosis Date Noted  . Glaucoma 07/14/2017  . Macular degeneration 07/14/2017  . Hypothyroidism 02/02/2017  . Depression 07/05/2016  . GERD (gastroesophageal reflux disease) 06/10/2016  . Cerumen debris on tympanic membrane 03/14/2016  . PVD (peripheral vascular disease) ([place]) 01/24/2016  . Dementia without behavioral disturbance 01/01/2016  . Encounter for family conference with patient present 01/01/2016  . Aspiration of food 12/17/2015  . Speech abnormality 11/29/2015  . Senile purpura ([place]) 11/29/2015  . Hip pain, acute 11/06/2015  . Acute encephalopathy 11/06/2015  . Bilateral lower extremity edema 11/06/2015  . UTI (urinary tract infection) 11/06/2015  . Chronic respiratory failure with hypoxia ([place]) 11/06/2015  . Closed left hip fracture ([place]) 09/09/2015  . Chronic diastolic CHF (  congestive heart failure) ([place]) 09/09/2015  . COPD (chronic obstructive pulmonary disease) ([place])   . Atrial fibrillation ([place])   . Hypertensive heart disease with CHF (congestive heart failure) ([place])   . Chronic bronchitis ([place])   . Essential hypertension   . Fall   . Closed pertrochanteric fracture ([facility])     CMP     Component Value Date/Time   NA 140 10/15/2017   K 4.3 10/15/2017   CL 107 09/13/2015 0448   CO2 24 09/13/2015 0448   GLUCOSE 101 (H) 09/13/2015 0448   BUN 10 10/15/2017   CREATININE 0.7 10/15/2017   CREATININE 0.66 09/13/2015 0448   CALCIUM 8.7 (L) 09/13/2015 0448   PROT 6.9 09/08/2015 2325   ALBUMIN 4.1 09/08/2015 2325   AST 13 10/15/2017   ALT 5 (A) 10/15/2017   ALKPHOS 78 10/15/2017   BILITOT 1.3 (H) 09/08/2015 2325   GFRNONAA >60 09/13/2015 0448   GFRAA >60 09/13/2015 0448   Recent Labs    03/25/17 03/27/17 10/15/17  NA 139 141 140  K 5.8* 4.7 4.3  BUN 21  24* 10  CREATININE 0.7 0.9 0.7   Recent Labs    03/25/17 10/15/17  AST 17 13  ALT 6* 5*  ALKPHOS 82 78   Recent Labs    03/25/17 10/15/17  WBC 9.3 6.0  HGB 14.8 13.3  HCT 46 41  PLT 132* 146*   Recent Labs    10/15/17  CHOL 137  LDLCALC 65  TRIG 61   No results found for: [facility] Lab Results  Component Value Date   TSH 3.04 10/15/2017   Lab Results  Component Value Date   HGBA1C 5.1 10/15/2017   Lab Results  Component Value Date   CHOL 137 10/15/2017   HDL 60 10/15/2017   LDLCALC 65 10/15/2017   TRIG 61 10/15/2017    Significant Diagnostic Results in last 30 days:  No results found.  Assessment and Plan  Hypertensive heart disease with CHF (congestive heart failure) (HCC) Controlled; plan to continue Aldactone 12.5 mg by mouth daily; metoprolol 12.5 mg by mouth twice a day and Lasix 60 mg by mouth daily  COPD (chronic obstructive pulmonary disease) ([place]) Patient without recent problems; continue guaifenesin 600 mg by mouth twice a day; continue to monitor  GERD (gastroesophageal reflux disease) visit reflux; continue Protonix 40 mg by mouth daily    [name] MD

## 2017-11-29 DIAGNOSIS — F0390 Unspecified dementia without behavioral disturbance: Secondary | ICD-10-CM | POA: Diagnosis not present

## 2017-11-29 DIAGNOSIS — G4700 Insomnia, unspecified: Secondary | ICD-10-CM | POA: Diagnosis not present

## 2017-11-29 DIAGNOSIS — F331 Major depressive disorder, recurrent, moderate: Secondary | ICD-10-CM | POA: Diagnosis not present

## 2017-12-07 ENCOUNTER — Encounter: Payer: Self-pay | Admitting: Internal Medicine

## 2017-12-07 NOTE — Assessment & Plan Note (Signed)
No exacerbations; patient's unwell; plan to continue Lasix 60 mg by mouth daily Aldactone 12.5 mg by mouth daily and metoprolol 12.5 milligrams twice a day

## 2017-12-07 NOTE — Assessment & Plan Note (Signed)
Stable; continue metoprolol 12.5 mg by mouth twice a day and prophylaxis with ASA 325 mg by mouth daily

## 2017-12-07 NOTE — Assessment & Plan Note (Signed)
No skin breakdown; patient is doing well on ASA 325 mg by mouth daily as prophylaxis

## 2017-12-14 ENCOUNTER — Encounter: Payer: Self-pay | Admitting: Internal Medicine

## 2017-12-14 NOTE — Assessment & Plan Note (Signed)
Patient without recent problems; continue guaifenesin 600 mg by mouth twice a day; continue to monitor

## 2017-12-14 NOTE — Assessment & Plan Note (Signed)
Controlled; plan to continue Aldactone 12.5 mg by mouth daily; metoprolol 12.5 mg by mouth twice a day and Lasix 60 mg by mouth daily

## 2017-12-14 NOTE — Assessment & Plan Note (Signed)
visit reflux; continue Protonix 40 mg by mouth daily

## 2017-12-27 ENCOUNTER — Non-Acute Institutional Stay (SKILLED_NURSING_FACILITY): Payer: Medicare Other | Admitting: Internal Medicine

## 2017-12-27 DIAGNOSIS — G301 Alzheimer's disease with late onset: Secondary | ICD-10-CM

## 2017-12-27 DIAGNOSIS — F0390 Unspecified dementia without behavioral disturbance: Secondary | ICD-10-CM | POA: Diagnosis not present

## 2017-12-27 DIAGNOSIS — E034 Atrophy of thyroid (acquired): Secondary | ICD-10-CM | POA: Diagnosis not present

## 2017-12-27 DIAGNOSIS — H353130 Nonexudative age-related macular degeneration, bilateral, stage unspecified: Secondary | ICD-10-CM | POA: Diagnosis not present

## 2017-12-27 DIAGNOSIS — F0280 Dementia in other diseases classified elsewhere without behavioral disturbance: Secondary | ICD-10-CM

## 2017-12-27 DIAGNOSIS — F331 Major depressive disorder, recurrent, moderate: Secondary | ICD-10-CM | POA: Diagnosis not present

## 2017-12-27 DIAGNOSIS — G4700 Insomnia, unspecified: Secondary | ICD-10-CM | POA: Diagnosis not present

## 2017-12-29 ENCOUNTER — Encounter: Payer: Self-pay | Admitting: Internal Medicine

## 2017-12-29 NOTE — Assessment & Plan Note (Signed)
Continue stable; continue PreserVision AREDS twice a day

## 2017-12-29 NOTE — Progress Notes (Signed)
Location:  [facility] of Service:  SNF (31)skilled nursing facility  Mariah Duos, MD  Patient Care Team: Mariah Duos, MD as PCP - General (Internal Medicine)  Extended Emergency Contact Information Primary Emergency Contact: [name],[name] Address: [address]          [place], [place] [address] [name] of [place] Phone: [phone] Relation: Daughter    Allergies: Lyrica [pregabalin] and Penicillins  Chief Complaint  Patient presents with  . Medical Management of Chronic Issues    HPI: Patient is 84 y.o. female who is being seen for routine issues of hypothyroidism, dementia, and macular degeneration.  Past Medical History:  Diagnosis Date  . Acute encephalopathy 11/06/2015  . Atrial fibrillation ([place])   . CHF (congestive heart failure) ([place])   . Chronic diastolic CHF (congestive heart failure) ([place]) 09/09/2015  . Chronic respiratory failure with hypoxia ([place]) 11/06/2015  . Closed left hip fracture ([place]) 09/09/2015  . Closed pertrochanteric fracture ([place])   . COPD (chronic obstructive pulmonary disease) ([place])   . Dementia without behavioral disturbance 01/01/2016  . GERD (gastroesophageal reflux disease) 06/10/2016  . Glaucoma   . Hypertension   . Hypothyroidism 02/02/2017  . PVD (peripheral vascular disease) ([place]) 01/24/2016  . Speech abnormality 11/29/2015    Past Surgical History:  Procedure Laterality Date  . FEMUR IM NAIL Left 09/09/2015   Procedure: INTRAMEDULLARY (IM) NAIL FEMORAL;  Surgeon: Rod Can, MD;  Location: [facility]S;  Service: Orthopedics;  Laterality: Left;  . HERNIA REPAIR      Allergies as of 12/27/2017      Reactions   Lyrica [pregabalin] Other (See Comments)   Caused her to be lethargic and her legs felt as if she could not stand   Penicillins Rash      Medication List        Accurate as of 12/27/17 11:59 PM. Always use your most recent med list.          acetaminophen 325  MG tablet Commonly known as:  TYLENOL Take 650 mg by mouth every 4 (four) hours as needed for moderate pain.   aspirin 325 MG EC tablet Take 325 mg by mouth daily.   BIOFREEZE 4 % Gel Generic drug:  Menthol (Topical Analgesic) Apply topically. Apply sparingly to affected areas three times daily as needed for pain   brimonidine 0.1 % Soln Commonly known as:  ALPHAGAN P Place 1 drop into both eyes 2 (two) times daily.   carboxymethylcellulose 1 % ophthalmic solution Place 1 drop into both eyes 2 (two) times daily.   gabapentin 300 MG capsule Commonly known as:  NEURONTIN Take 300 mg by mouth at bedtime.   guaiFENesin 600 MG 12 hr tablet Commonly known as:  MUCINEX Take 600 mg by mouth 2 (two) times daily.   HYDROcodone-acetaminophen 5-325 MG tablet Commonly known as:  NORCO/VICODIN Take 1 tablet by mouth every 4 (four) hours as needed for moderate pain or severe pain. No more than 3 grams of Tylenol in 24 hours   ICAPS AREDS 2 Caps Take 1 capsule by mouth 2 (two) times daily.   LASIX 40 MG tablet Generic drug:  furosemide Take 40 mg by mouth daily. Take 1 tab with 20 mg to equal 60 mg by mouth daily   furosemide 20 MG tablet Commonly known as:  LASIX Take 20 mg by mouth daily. Take 1 tab with 40 mg to equal 60 mg by mouth daily  latanoprost 0.005 % ophthalmic solution Commonly known as:  XALATAN Place 1 drop into both eyes at bedtime.   levothyroxine 88 MCG tablet Commonly known as:  SYNTHROID, LEVOTHROID Take 88 mcg daily before breakfast by mouth.   Melatonin 1 MG Tabs Take 2 mg by mouth at bedtime.   metoprolol tartrate 25 MG tablet Commonly known as:  LOPRESSOR Take 12.5 mg by mouth 2 (two) times daily.   pantoprazole 40 MG tablet Commonly known as:  PROTONIX Take 40 mg by mouth daily.   polyethylene glycol packet Commonly known as:  MIRALAX / GLYCOLAX Take 17 g by mouth every other day.   potassium chloride SA 20 MEQ tablet Commonly known as:   K-DUR,KLOR-CON Take 20 mEq by mouth daily.   spironolactone 25 MG tablet Commonly known as:  ALDACTONE Take 12.5 mg by mouth daily.   traMADol 50 MG tablet Commonly known as:  ULTRAM Take 1 tablet (50 mg total) by mouth at bedtime.       No orders of the defined types were placed in this encounter.   Immunization History  Administered Date(s) Administered  . Influenza, High Dose Seasonal PF 08/19/2014  . Influenza-Unspecified 08/25/2015, 08/16/2016, 08/29/2017  . PPD Test 10/31/2015, 11/17/2015  . Pneumococcal Conjugate-13 02/04/2015  . Pneumococcal Polysaccharide-23 01/17/2009  . Pneumococcal-Unspecified 01/17/2009  . Tdap 07/20/2013    Social History   Tobacco Use  . Smoking status: Never Smoker  . Smokeless tobacco: Never Used  Substance Use Topics  . Alcohol use: No    Alcohol/week: 0.0 oz    Review of Systems  DATA OBTAINED: from patient-limited; nursing-no concerns GENERAL:  no fevers, fatigue, appetite changes SKIN: No itching, rash HEENT: No complaint RESPIRATORY: No cough, wheezing, SOB CARDIAC: No chest pain, palpitations, lower extremity edema  GI: No abdominal pain, No N/V/D or constipation, No heartburn or reflux  GU: No dysuria, frequency or urgency, or incontinence  MUSCULOSKELETAL: No unrelieved bone/joint pain NEUROLOGIC: No headache, dizziness  PSYCHIATRIC: No overt anxiety or sadness  Vitals:   12/29/17 1912  BP: 107/68  Pulse: 80  Resp: 18  Temp: (!) 97 F (36.1 C)   There is no height or weight on file to calculate BMI. Physical Exam  GENERAL APPEARANCE: Alert, conversant, No acute distress  SKIN: No diaphoresis rash HEENT: Unremarkable RESPIRATORY: Breathing is even, unlabored. Lung sounds are clear   CARDIOVASCULAR: Heart RRR no murmurs, rubs or gallops. No peripheral edema  GASTROINTESTINAL: Abdomen is soft, non-tender, not distended w/ normal bowel sounds.  GENITOURINARY: Bladder non tender, not distended  MUSCULOSKELETAL:  No abnormal joints or musculature NEUROLOGIC: Cranial nerves 2-12 grossly intact. Moves all extremities PSYCHIATRIC: Mood and affect appropriate to situation, with dementia no behavioral issues  Patient Active Problem List   Diagnosis Date Noted  . Glaucoma 07/14/2017  . Macular degeneration 07/14/2017  . Hypothyroidism 02/02/2017  . Depression 07/05/2016  . GERD (gastroesophageal reflux disease) 06/10/2016  . Cerumen debris on tympanic membrane 03/14/2016  . PVD (peripheral vascular disease) ([place]) 01/24/2016  . Dementia without behavioral disturbance 01/01/2016  . Encounter for family conference with patient present 01/01/2016  . Aspiration of food 12/17/2015  . Speech abnormality 11/29/2015  . Senile purpura ([place]) 11/29/2015  . Hip pain, acute 11/06/2015  . Acute encephalopathy 11/06/2015  . Bilateral lower extremity edema 11/06/2015  . UTI (urinary tract infection) 11/06/2015  . Chronic respiratory failure with hypoxia ([place]) 11/06/2015  . Closed left hip fracture ([place]) 09/09/2015  . Chronic diastolic CHF (congestive heart  failure) ([place]) 09/09/2015  . COPD (chronic obstructive pulmonary disease) ([place])   . Atrial fibrillation ([place])   . Hypertensive heart disease with CHF (congestive heart failure) ([place])   . Chronic bronchitis ([place])   . Essential hypertension   . Fall   . Closed pertrochanteric fracture ([facility])     CMP     Component Value Date/Time   NA 140 10/15/2017   K 4.3 10/15/2017   CL 107 09/13/2015 0448   CO2 24 09/13/2015 0448   GLUCOSE 101 (H) 09/13/2015 0448   BUN 10 10/15/2017   CREATININE 0.7 10/15/2017   CREATININE 0.66 09/13/2015 0448   CALCIUM 8.7 (L) 09/13/2015 0448   PROT 6.9 09/08/2015 2325   ALBUMIN 4.1 09/08/2015 2325   AST 13 10/15/2017   ALT 5 (A) 10/15/2017   ALKPHOS 78 10/15/2017   BILITOT 1.3 (H) 09/08/2015 2325   GFRNONAA >60 09/13/2015 0448   GFRAA >60 09/13/2015 0448   Recent Labs    03/25/17 03/27/17 10/15/17  NA 139 141 140  K  5.8* 4.7 4.3  BUN 21 24* 10  CREATININE 0.7 0.9 0.7   Recent Labs    03/25/17 10/15/17  AST 17 13  ALT 6* 5*  ALKPHOS 82 78   Recent Labs    03/25/17 10/15/17  WBC 9.3 6.0  HGB 14.8 13.3  HCT 46 41  PLT 132* 146*   Recent Labs    10/15/17  CHOL 137  LDLCALC 65  TRIG 61   No results found for: [facility] Lab Results  Component Value Date   TSH 3.04 10/15/2017   Lab Results  Component Value Date   HGBA1C 5.1 10/15/2017   Lab Results  Component Value Date   CHOL 137 10/15/2017   HDL 60 10/15/2017   LDLCALC 65 10/15/2017   TRIG 61 10/15/2017    Significant Diagnostic Results in last 30 days:  No results found.  Assessment and Plan  Hypothyroidism TSH 3.04, excellent; plan to continue Synthroid 88 g daily  Dementia without behavioral disturbance There is stable no declines, out of room daily; continue supportive care  Macular degeneration Continue stable; continue PreserVision AREDS twice a day    [name], MD

## 2017-12-29 NOTE — Assessment & Plan Note (Signed)
There is stable no declines, out of room daily; continue supportive care

## 2017-12-29 NOTE — Assessment & Plan Note (Signed)
TSH 3.04, excellent; plan to continue Synthroid 88 g daily

## 2018-01-02 DIAGNOSIS — I739 Peripheral vascular disease, unspecified: Secondary | ICD-10-CM | POA: Diagnosis not present

## 2018-01-02 DIAGNOSIS — B351 Tinea unguium: Secondary | ICD-10-CM | POA: Diagnosis not present

## 2018-01-02 DIAGNOSIS — L603 Nail dystrophy: Secondary | ICD-10-CM | POA: Diagnosis not present

## 2018-01-20 DIAGNOSIS — F0390 Unspecified dementia without behavioral disturbance: Secondary | ICD-10-CM | POA: Diagnosis not present

## 2018-01-20 DIAGNOSIS — F331 Major depressive disorder, recurrent, moderate: Secondary | ICD-10-CM | POA: Diagnosis not present

## 2018-01-20 DIAGNOSIS — G4700 Insomnia, unspecified: Secondary | ICD-10-CM | POA: Diagnosis not present

## 2018-01-23 ENCOUNTER — Non-Acute Institutional Stay (SKILLED_NURSING_FACILITY): Payer: Medicare Other | Admitting: Internal Medicine

## 2018-01-23 DIAGNOSIS — H409 Unspecified glaucoma: Secondary | ICD-10-CM

## 2018-01-23 DIAGNOSIS — G629 Polyneuropathy, unspecified: Secondary | ICD-10-CM

## 2018-01-23 DIAGNOSIS — R600 Localized edema: Secondary | ICD-10-CM

## 2018-01-26 ENCOUNTER — Encounter: Payer: Self-pay | Admitting: Internal Medicine

## 2018-01-26 DIAGNOSIS — G629 Polyneuropathy, unspecified: Secondary | ICD-10-CM | POA: Insufficient documentation

## 2018-01-26 NOTE — Progress Notes (Signed)
Location:  [facility] of Service:  SNF (31)  [name], MD  Patient Care Team: [name], MD as PCP - General (Internal Medicine)  Extended Emergency Contact Information Primary Emergency Contact: [name],[name] Address: [address]          [place], [place] [address] [name] of [place] Phone: [phone] Relation: Daughter    Allergies: Lyrica [pregabalin] and Penicillins  Chief Complaint  Patient presents with  . Medical Management of Chronic Issues    HPI: Patient is 84 y.o. female who is being seen for routine issues of glaucoma, neuropathy, and bilateral lower extremity edema,.  Past Medical History:  Diagnosis Date  . Acute encephalopathy 11/06/2015  . Atrial fibrillation ([place])   . CHF (congestive heart failure) ([place])   . Chronic diastolic CHF (congestive heart failure) ([place]) 09/09/2015  . Chronic respiratory failure with hypoxia ([place]) 11/06/2015  . Closed left hip fracture ([place]) 09/09/2015  . Closed pertrochanteric fracture ([place])   . COPD (chronic obstructive pulmonary disease) ([place])   . Dementia without behavioral disturbance 01/01/2016  . GERD (gastroesophageal reflux disease) 06/10/2016  . Glaucoma   . Hypertension   . Hypothyroidism 02/02/2017  . PVD (peripheral vascular disease) ([place]) 01/24/2016  . Speech abnormality 11/29/2015    Past Surgical History:  Procedure Laterality Date  . FEMUR IM NAIL Left 09/09/2015   Procedure: INTRAMEDULLARY (IM) NAIL FEMORAL;  Surgeon: Rod Can, MD;  Location: [facility];  Service: Orthopedics;  Laterality: Left;  . HERNIA REPAIR      Allergies as of 01/23/2018      Reactions   Lyrica [pregabalin] Other (See Comments)   Caused her to be lethargic and her legs felt as if she could not stand   Penicillins Rash      Medication List        Accurate as of 01/23/18 11:59 PM. Always use your most recent med list.          acetaminophen 325 MG tablet Commonly known  as:  TYLENOL Take 650 mg by mouth every 4 (four) hours as needed for moderate pain.   aspirin 325 MG EC tablet Take 325 mg by mouth daily.   BIOFREEZE 4 % Gel Generic drug:  Menthol (Topical Analgesic) Apply topically. Apply sparingly to affected areas three times daily as needed for pain   brimonidine 0.1 % Soln Commonly known as:  ALPHAGAN P Place 1 drop into both eyes 2 (two) times daily.   carboxymethylcellulose 1 % ophthalmic solution Place 1 drop into both eyes 2 (two) times daily.   gabapentin 300 MG capsule Commonly known as:  NEURONTIN Take 300 mg by mouth at bedtime.   guaiFENesin 600 MG 12 hr tablet Commonly known as:  MUCINEX Take 600 mg by mouth 2 (two) times daily.   HYDROcodone-acetaminophen 5-325 MG tablet Commonly known as:  NORCO/VICODIN Take 1 tablet by mouth every 4 (four) hours as needed for moderate pain or severe pain. No more than 3 grams of Tylenol in 24 hours   ICAPS AREDS 2 Caps Take 1 capsule by mouth 2 (two) times daily.   LASIX 40 MG tablet Generic drug:  furosemide Take 40 mg by mouth daily. Take 1 tab with 20 mg to equal 60 mg by mouth daily   furosemide 20 MG tablet Commonly known as:  LASIX Take 20 mg by mouth daily. Take 1 tab with 40 mg to equal 60 mg by mouth daily  latanoprost 0.005 % ophthalmic solution Commonly known as:  XALATAN Place 1 drop into both eyes at bedtime.   levothyroxine 88 MCG tablet Commonly known as:  SYNTHROID, LEVOTHROID Take 88 mcg daily before breakfast by mouth.   Melatonin 1 MG Tabs Take 2 mg by mouth at bedtime.   metoprolol tartrate 25 MG tablet Commonly known as:  LOPRESSOR Take 12.5 mg by mouth 2 (two) times daily.   pantoprazole 40 MG tablet Commonly known as:  PROTONIX Take 40 mg by mouth daily.   polyethylene glycol packet Commonly known as:  MIRALAX / GLYCOLAX Take 17 g by mouth every other day.   potassium chloride SA 20 MEQ tablet Commonly known as:  K-DUR,KLOR-CON Take 20 mEq by  mouth daily.   spironolactone 25 MG tablet Commonly known as:  ALDACTONE Take 12.5 mg by mouth daily.   traMADol 50 MG tablet Commonly known as:  ULTRAM Take 1 tablet (50 mg total) by mouth at bedtime.       No orders of the defined types were placed in this encounter.   Immunization History  Administered Date(s) Administered  . Influenza, High Dose Seasonal PF 08/19/2014  . Influenza-Unspecified 08/25/2015, 08/16/2016, 08/29/2017  . PPD Test 10/31/2015, 11/17/2015  . Pneumococcal Conjugate-13 02/04/2015  . Pneumococcal Polysaccharide-23 01/17/2009  . Pneumococcal-Unspecified 01/17/2009  . Tdap 07/20/2013    Social History   Tobacco Use  . Smoking status: Never Smoker  . Smokeless tobacco: Never Used  Substance Use Topics  . Alcohol use: No    Alcohol/week: 0.0 oz    Review of Systems  DATA OBTAINED: from patient-we will continue; nursing-no acute concerns GENERAL:  no fevers, fatigue, appetite changes SKIN: No itching, rash HEENT: No complaint RESPIRATORY: No cough, wheezing, SOB CARDIAC: No chest pain, palpitations, lower extremity edema  GI: No abdominal pain, No N/V/D or constipation, No heartburn or reflux  GU: No dysuria, frequency or urgency, or incontinence  MUSCULOSKELETAL: No unrelieved bone/joint pain NEUROLOGIC: No headache, dizziness  PSYCHIATRIC: No overt anxiety or sadness  Vitals:   01/26/18 1453  BP: 124/69  Pulse: 97  Resp: 19  Temp: (!) 97.3 F (36.3 C)  SpO2: 97%   There is no height or weight on file to calculate BMI. Physical Exam  GENERAL APPEARANCE: Alert, conversant, No acute distress  SKIN: No diaphoresis rash HEENT: Unremarkable RESPIRATORY: Breathing is even, unlabored. Lung sounds are clear   CARDIOVASCULAR: Heart RRR no murmurs, rubs or gallops. No peripheral edema  GASTROINTESTINAL: Abdomen is soft, non-tender, not distended w/ normal bowel sounds.  GENITOURINARY: Bladder non tender, not distended  MUSCULOSKELETAL:  No abnormal joints or musculature NEUROLOGIC: Cranial nerves 2-12 grossly intact. Moves all extremities PSYCHIATRIC: Mood and affect with dementia, no behavioral issues  Patient Active Problem List   Diagnosis Date Noted  . Neuropathy 01/26/2018  . Glaucoma 07/14/2017  . Macular degeneration 07/14/2017  . Hypothyroidism 02/02/2017  . Depression 07/05/2016  . GERD (gastroesophageal reflux disease) 06/10/2016  . Cerumen debris on tympanic membrane 03/14/2016  . PVD (peripheral vascular disease) ([place]) 01/24/2016  . Dementia without behavioral disturbance 01/01/2016  . Encounter for family conference with patient present 01/01/2016  . Aspiration of food 12/17/2015  . Speech abnormality 11/29/2015  . Senile purpura ([place]) 11/29/2015  . Hip pain, acute 11/06/2015  . Acute encephalopathy 11/06/2015  . Bilateral lower extremity edema 11/06/2015  . UTI (urinary tract infection) 11/06/2015  . Chronic respiratory failure with hypoxia ([place]) 11/06/2015  . Closed left hip fracture ([place]) 09/09/2015  .  Chronic diastolic CHF (congestive heart failure) ([place]) 09/09/2015  . COPD (chronic obstructive pulmonary disease) ([place])   . Atrial fibrillation ([place])   . Hypertensive heart disease with CHF (congestive heart failure) ([place])   . Chronic bronchitis ([place])   . Essential hypertension   . Fall   . Closed pertrochanteric fracture ([facility])     CMP     Component Value Date/Time   NA 140 10/15/2017   K 4.3 10/15/2017   CL 107 09/13/2015 0448   CO2 24 09/13/2015 0448   GLUCOSE 101 (H) 09/13/2015 0448   BUN 10 10/15/2017   CREATININE 0.7 10/15/2017   CREATININE 0.66 09/13/2015 0448   CALCIUM 8.7 (L) 09/13/2015 0448   PROT 6.9 09/08/2015 2325   ALBUMIN 4.1 09/08/2015 2325   AST 13 10/15/2017   ALT 5 (A) 10/15/2017   ALKPHOS 78 10/15/2017   BILITOT 1.3 (H) 09/08/2015 2325   GFRNONAA >60 09/13/2015 0448   GFRAA >60 09/13/2015 0448   Recent Labs    03/25/17 03/27/17 10/15/17  NA 139 141 140  K  5.8* 4.7 4.3  BUN 21 24* 10  CREATININE 0.7 0.9 0.7   Recent Labs    03/25/17 10/15/17  AST 17 13  ALT 6* 5*  ALKPHOS 82 78   Recent Labs    03/25/17 10/15/17  WBC 9.3 6.0  HGB 14.8 13.3  HCT 46 41  PLT 132* 146*   Recent Labs    10/15/17  CHOL 137  LDLCALC 65  TRIG 61   No results found for: [facility] Lab Results  Component Value Date   TSH 3.04 10/15/2017   Lab Results  Component Value Date   HGBA1C 5.1 10/15/2017   Lab Results  Component Value Date   CHOL 137 10/15/2017   HDL 60 10/15/2017   LDLCALC 65 10/15/2017   TRIG 61 10/15/2017    Significant Diagnostic Results in last 30 days:  No results found.  Assessment and Plan  Glaucoma Stable; continue xalantan 0.005% 1 drop at bedtime and Alphagan 0.1% 1 drop twice a day  Neuropathy No reports of pain; continue Neurontin 300 mg by mouth daily at bedtime  Bilateral lower extremity edema Chronic and at baseline; continue Lasix 60 mg by mouth daily and spironolactone 12.5 mg by mouth daily; patient will not elevate legs    [name], MD

## 2018-01-26 NOTE — Assessment & Plan Note (Signed)
Chronic and at baseline; continue Lasix 60 mg by mouth daily and spironolactone 12.5 mg by mouth daily; patient will not elevate legs

## 2018-01-26 NOTE — Assessment & Plan Note (Signed)
Stable; continue xalantan 0.005% 1 drop at bedtime and Alphagan 0.1% 1 drop twice a day

## 2018-01-26 NOTE — Assessment & Plan Note (Signed)
No reports of pain; continue Neurontin 300 mg by mouth daily at bedtime

## 2018-02-01 ENCOUNTER — Other Ambulatory Visit: Payer: Self-pay | Admitting: Internal Medicine

## 2018-02-11 ENCOUNTER — Non-Acute Institutional Stay (SKILLED_NURSING_FACILITY): Payer: Medicare Other | Admitting: Internal Medicine

## 2018-02-11 ENCOUNTER — Encounter: Payer: Self-pay | Admitting: Internal Medicine

## 2018-02-11 DIAGNOSIS — I5032 Chronic diastolic (congestive) heart failure: Secondary | ICD-10-CM

## 2018-02-11 DIAGNOSIS — R600 Localized edema: Secondary | ICD-10-CM

## 2018-02-11 DIAGNOSIS — I739 Peripheral vascular disease, unspecified: Secondary | ICD-10-CM | POA: Diagnosis not present

## 2018-02-11 DIAGNOSIS — K219 Gastro-esophageal reflux disease without esophagitis: Secondary | ICD-10-CM

## 2018-02-11 DIAGNOSIS — F0280 Dementia in other diseases classified elsewhere without behavioral disturbance: Secondary | ICD-10-CM

## 2018-02-11 DIAGNOSIS — H409 Unspecified glaucoma: Secondary | ICD-10-CM | POA: Diagnosis not present

## 2018-02-11 DIAGNOSIS — J42 Unspecified chronic bronchitis: Secondary | ICD-10-CM

## 2018-02-11 DIAGNOSIS — G629 Polyneuropathy, unspecified: Secondary | ICD-10-CM

## 2018-02-11 DIAGNOSIS — I110 Hypertensive heart disease with heart failure: Secondary | ICD-10-CM | POA: Diagnosis not present

## 2018-02-11 DIAGNOSIS — I480 Paroxysmal atrial fibrillation: Secondary | ICD-10-CM | POA: Diagnosis not present

## 2018-02-11 DIAGNOSIS — G301 Alzheimer's disease with late onset: Secondary | ICD-10-CM

## 2018-02-11 DIAGNOSIS — Z9181 History of falling: Secondary | ICD-10-CM | POA: Diagnosis not present

## 2018-02-11 DIAGNOSIS — H353130 Nonexudative age-related macular degeneration, bilateral, stage unspecified: Secondary | ICD-10-CM

## 2018-02-11 DIAGNOSIS — E034 Atrophy of thyroid (acquired): Secondary | ICD-10-CM

## 2018-02-11 NOTE — Progress Notes (Signed)
Location:  [facility] Room Number: 213W Place of Service:  SNF [phone])  PCP: [name], MD Patient Care Team: [name], MD as PCP - General (Internal Medicine)  Extended Emergency Contact Information Primary Emergency Contact: [name],[name] Address: [address]          [place], [place] [address] [name] of [place] Phone: [phone] Relation: Daughter  Allergies  Allergen Reactions  . Lyrica [Pregabalin] Other (See Comments)    Caused her to be lethargic and her legs felt as if she could not stand  . Penicillins Rash    Chief Complaint  Patient presents with  . Discharge Note    Discharged from SNF    HPI:  84 y.o. female with chronic respiratory failure, COPD, congestive heart failure, PAF, who was hospitalized at [facility] from 12/6- 10/2015 status post fall and with mental status changes.  Patient was found to have loosening of components of a left hip fracture repair from 2 months ago but conservative care only was recommended.  It was felt the patient's confusion was secondary to UTI may be an early pneumonia and was treated with Rocephin.  Patient's confusion improved but did not totally resolve at the time of discharge.  Patient was admitted to skilled nursing facility for generalized weakness and need for higher level of care.  He has been [place] for 2+ years now and is being discharged to assisted living facility.  Early after admission to skilled nursing facility patient was followed by hospice with failure to thrive but since that time patient has improved and stabilized and has done very well.  Patient continues to have generalized weakness especially lower extremity weakness and does not have the endurance to walk further than across the room and uses her wheelchair regularly for any excursions outside her room.    Past Medical History:  Diagnosis Date  . Acute encephalopathy 11/06/2015  .  Atrial fibrillation ([place])   . CHF (congestive heart failure) ([place])   . Chronic diastolic CHF (congestive heart failure) ([place]) 09/09/2015  . Chronic respiratory failure with hypoxia ([place]) 11/06/2015  . Closed left hip fracture ([place]) 09/09/2015  . Closed pertrochanteric fracture ([place])   . COPD (chronic obstructive pulmonary disease) ([place])   . Dementia without behavioral disturbance 01/01/2016  . GERD (gastroesophageal reflux disease) 06/10/2016  . Glaucoma   . Hypertension   . Hypothyroidism 02/02/2017  . PVD (peripheral vascular disease) ([place]) 01/24/2016  . Speech abnormality 11/29/2015    Past Surgical History:  Procedure Laterality Date  . FEMUR IM NAIL Left 09/09/2015   Procedure: INTRAMEDULLARY (IM) NAIL FEMORAL;  Surgeon: Rod Can, MD;  Location: [facility] ORS;  Service: Orthopedics;  Laterality: Left;  . HERNIA REPAIR       reports that she has never smoked. She has never used smokeless tobacco. She reports that she does not drink alcohol or use drugs. Social History   Socioeconomic History  . Marital status: Widowed    Spouse name: Not on file  . Number of children: Not on file  . Years of education: Not on file  . Highest education level: Not on file  Occupational History  . Occupation: retired Holiday representative  . Financial resource strain: Not on file  . Food insecurity:    Worry: Not on file    Inability: Not on file  . Transportation needs:    Medical: Not on file    Non-medical:  Not on file  Tobacco Use  . Smoking status: Never Smoker  . Smokeless tobacco: Never Used  Substance and Sexual Activity  . Alcohol use: No    Alcohol/week: 0.0 oz  . Drug use: No  . Sexual activity: Never  Lifestyle  . Physical activity:    Days per week: Not on file    Minutes per session: Not on file  . Stress: Not on file  Relationships  . Social connections:    Talks on phone: Not on file    Gets together: Not on file    Attends religious service: Not on file    Active  member of club or organization: Not on file    Attends meetings of clubs or organizations: Not on file    Relationship status: Not on file  . Intimate partner violence:    Fear of current or ex partner: Not on file    Emotionally abused: Not on file    Physically abused: Not on file    Forced sexual activity: Not on file  Other Topics Concern  . Not on file  Social History Narrative   Admitted to [facility] 10/31/15   Widowed   Never smoked   Alcohol  none   DNR, MOST    Pertinent  Health Maintenance Due  Topic Date Due  . DEXA SCAN  11/20/2023 (Originally 09/26/1986)  . INFLUENZA VACCINE  Completed  . PNA vac Low Risk Adult  Completed    Medications: Allergies as of 02/11/2018      Reactions   Lyrica [pregabalin] Other (See Comments)   Caused her to be lethargic and her legs felt as if she could not stand   Penicillins Rash      Medication List        Accurate as of 02/11/18 10:40 PM. Always use your most recent med list.          acetaminophen 325 MG tablet Commonly known as:  TYLENOL Take 650 mg by mouth every 4 (four) hours as needed for moderate pain.   aspirin 325 MG EC tablet Take 325 mg by mouth daily.   BIOFREEZE 4 % Gel Generic drug:  Menthol (Topical Analgesic) Apply topically. Apply sparingly to affected areas three times daily as needed for pain   brimonidine 0.1 % Soln Commonly known as:  ALPHAGAN P Place 1 drop into both eyes 2 (two) times daily.   carboxymethylcellulose 1 % ophthalmic solution Place 1 drop into both eyes 2 (two) times daily.   gabapentin 300 MG capsule Commonly known as:  NEURONTIN Take 300 mg by mouth at bedtime.   guaiFENesin 600 MG 12 hr tablet Commonly known as:  MUCINEX Take 600 mg by mouth 2 (two) times daily.   HYDROcodone-acetaminophen 5-325 MG tablet Commonly known as:  NORCO/VICODIN Take 1 tablet by mouth every 4 (four) hours as needed for moderate pain or severe pain. No more than 3 grams of  Tylenol in 24 hours 7 days of medicine was written   ICAPS AREDS 2 Caps Take 1 capsule by mouth 2 (two) times daily.   LASIX 40 MG tablet Generic drug:  furosemide Take 40 mg by mouth daily. Take 1 tab with 20 mg to equal 60 mg by mouth daily   furosemide 20 MG tablet Commonly known as:  LASIX Take 20 mg by mouth daily. Take 1 tab with 40 mg to equal 60 mg by mouth daily   latanoprost 0.005 % ophthalmic solution  Commonly known as:  XALATAN Place 1 drop into both eyes at bedtime.   levothyroxine 88 MCG tablet Commonly known as:  SYNTHROID, LEVOTHROID Take 88 mcg daily before breakfast by mouth.   Melatonin 1 MG Tabs Take 2 mg by mouth at bedtime. 2 tabs   metoprolol tartrate 25 MG tablet Commonly known as:  LOPRESSOR Take 12.5 mg by mouth 2 (two) times daily.   pantoprazole 40 MG tablet Commonly known as:  PROTONIX Take 40 mg by mouth daily.   polyethylene glycol packet Commonly known as:  MIRALAX / GLYCOLAX Take 17 g by mouth every other day.   potassium chloride SA 20 MEQ tablet Commonly known as:  K-DUR,KLOR-CON Take 20 mEq by mouth daily.   spironolactone 25 MG tablet Commonly known as:  ALDACTONE Take 12.5 mg by mouth daily.   traMADol 50 MG tablet Commonly known as:  ULTRAM Take 1 tablet (50 mg total) by mouth at bedtime.        Vitals:   02/11/18 1244  BP: 131/69  Pulse: 72  Resp: 18  Temp: 97.6 F (36.4 C)  TempSrc: Oral  SpO2: 98%  Weight: 119 lb 12.8 oz (54.3 kg)  Height: 5\' 1"  (1.549 m)   Body mass index is 22.64 kg/m.  Physical Exam  GENERAL APPEARANCE: Alert, conversant. No acute distress.  HEENT: Unremarkable. RESPIRATORY: Breathing is even, unlabored. Lung sounds are clear   CARDIOVASCULAR: Heart irregular 3/6 murmur, no  rubs or gallops.  Chronic peripheral edema baseline GASTROINTESTINAL: Abdomen is soft, non-tender, not distended w/ normal bowel sounds.  NEUROLOGIC: Cranial nerves 2-12 grossly intact. Moves all  extremities   Labs reviewed: Basic Metabolic Panel: Recent Labs    03/25/17 03/27/17 10/15/17  NA 139 141 140  K 5.8* 4.7 4.3  BUN 21 24* 10  CREATININE 0.7 0.9 0.7   No results found for: [facility] Liver Function Tests: Recent Labs    03/25/17 10/15/17  AST 17 13  ALT 6* 5*  ALKPHOS 82 78   No results for input(s): LIPASE, AMYLASE in the last 8760 hours. No results for input(s): AMMONIA in the last 8760 hours. CBC: Recent Labs    03/25/17 10/15/17  WBC 9.3 6.0  HGB 14.8 13.3  HCT 46 41  PLT 132* 146*   Lipid Recent Labs    10/15/17  CHOL 137  HDL 60  LDLCALC 65  TRIG 61   Cardiac Enzymes: No results for input(s): CKTOTAL, CKMB, CKMBINDEX, TROPONINI in the last 8760 hours. BNP: No results for input(s): BNP in the last 8760 hours. CBG: No results for input(s): GLUCAP in the last 8760 hours.  Procedures and Imaging Studies During Stay: No results found.  Assessment/Plan:   Chronic diastolic CHF (congestive heart failure) (HCC)  Bilateral lower extremity edema  Hypertensive heart disease with chronic diastolic congestive heart failure (HCC)  Chronic bronchitis, unspecified chronic bronchitis type (HCC)  Paroxysmal atrial fibrillation (HCC)  Late onset Alzheimer's disease without behavioral disturbance  Gastroesophageal reflux disease without esophagitis  Glaucoma of both eyes, unspecified glaucoma type  Hypothyroidism due to acquired atrophy of thyroid  Bilateral nonexudative age-related macular degeneration, unspecified stage  Neuropathy  PVD (peripheral vascular disease) ([place])   Patient is being discharged with the following home health services: OT/PT  Patient is being discharged with the following durable medical equipment: Standard wheelchair  Patient has been advised to f/u with their PCP in 1-2 weeks to bring them up to date on their rehab stay.  Social services at  facility was responsible for arranging this appointment.  Pt was  provided with a 30 day supply of prescriptions for medications and refills must be obtained from their PCP.  For controlled substances, a more limited supply may be provided adequate until PCP appointment only.  Medications have been reconciled.  Time spent greater than 30 minutes;> 50% of time with patient was spent reviewing records, labs, tests and studies, counseling and developing plan of care  Noah Delaine. Sheppard Coil, MD

## 2018-02-21 DIAGNOSIS — H409 Unspecified glaucoma: Secondary | ICD-10-CM | POA: Diagnosis not present

## 2018-02-21 DIAGNOSIS — G8929 Other chronic pain: Secondary | ICD-10-CM | POA: Diagnosis not present

## 2018-02-21 DIAGNOSIS — E039 Hypothyroidism, unspecified: Secondary | ICD-10-CM | POA: Diagnosis not present

## 2018-02-21 DIAGNOSIS — I10 Essential (primary) hypertension: Secondary | ICD-10-CM | POA: Diagnosis not present

## 2018-03-17 DIAGNOSIS — R600 Localized edema: Secondary | ICD-10-CM | POA: Diagnosis not present

## 2018-03-17 DIAGNOSIS — I739 Peripheral vascular disease, unspecified: Secondary | ICD-10-CM | POA: Diagnosis not present

## 2018-03-17 DIAGNOSIS — I509 Heart failure, unspecified: Secondary | ICD-10-CM | POA: Diagnosis not present

## 2018-03-17 DIAGNOSIS — G8929 Other chronic pain: Secondary | ICD-10-CM | POA: Diagnosis not present

## 2018-03-17 DIAGNOSIS — B351 Tinea unguium: Secondary | ICD-10-CM | POA: Diagnosis not present

## 2018-03-17 DIAGNOSIS — I10 Essential (primary) hypertension: Secondary | ICD-10-CM | POA: Diagnosis not present

## 2018-03-25 DIAGNOSIS — R600 Localized edema: Secondary | ICD-10-CM | POA: Diagnosis not present

## 2018-03-25 DIAGNOSIS — I509 Heart failure, unspecified: Secondary | ICD-10-CM | POA: Diagnosis not present

## 2018-03-25 DIAGNOSIS — I739 Peripheral vascular disease, unspecified: Secondary | ICD-10-CM | POA: Diagnosis not present

## 2018-03-28 DIAGNOSIS — I509 Heart failure, unspecified: Secondary | ICD-10-CM | POA: Diagnosis not present

## 2018-03-28 DIAGNOSIS — R600 Localized edema: Secondary | ICD-10-CM | POA: Diagnosis not present

## 2018-03-28 DIAGNOSIS — I739 Peripheral vascular disease, unspecified: Secondary | ICD-10-CM | POA: Diagnosis not present

## 2018-03-28 DIAGNOSIS — G8929 Other chronic pain: Secondary | ICD-10-CM | POA: Diagnosis not present

## 2018-03-31 DIAGNOSIS — R600 Localized edema: Secondary | ICD-10-CM | POA: Diagnosis not present

## 2018-05-05 DIAGNOSIS — I509 Heart failure, unspecified: Secondary | ICD-10-CM | POA: Diagnosis not present

## 2018-05-05 DIAGNOSIS — G8929 Other chronic pain: Secondary | ICD-10-CM | POA: Diagnosis not present

## 2018-05-05 DIAGNOSIS — R600 Localized edema: Secondary | ICD-10-CM | POA: Diagnosis not present

## 2018-05-05 DIAGNOSIS — N183 Chronic kidney disease, stage 3 (moderate): Secondary | ICD-10-CM | POA: Diagnosis not present

## 2018-05-15 DIAGNOSIS — S80811A Abrasion, right lower leg, initial encounter: Secondary | ICD-10-CM | POA: Diagnosis not present

## 2018-05-15 DIAGNOSIS — R609 Edema, unspecified: Secondary | ICD-10-CM | POA: Diagnosis not present

## 2018-05-15 DIAGNOSIS — I509 Heart failure, unspecified: Secondary | ICD-10-CM | POA: Diagnosis not present

## 2018-05-28 DIAGNOSIS — L02416 Cutaneous abscess of left lower limb: Secondary | ICD-10-CM | POA: Diagnosis not present

## 2018-05-28 DIAGNOSIS — F5104 Psychophysiologic insomnia: Secondary | ICD-10-CM | POA: Diagnosis not present

## 2018-05-28 DIAGNOSIS — I10 Essential (primary) hypertension: Secondary | ICD-10-CM | POA: Diagnosis not present

## 2018-05-28 DIAGNOSIS — L03116 Cellulitis of left lower limb: Secondary | ICD-10-CM | POA: Diagnosis not present

## 2018-05-30 DIAGNOSIS — I509 Heart failure, unspecified: Secondary | ICD-10-CM | POA: Diagnosis not present

## 2018-05-30 DIAGNOSIS — I872 Venous insufficiency (chronic) (peripheral): Secondary | ICD-10-CM | POA: Diagnosis not present

## 2018-05-30 DIAGNOSIS — I4891 Unspecified atrial fibrillation: Secondary | ICD-10-CM | POA: Diagnosis not present

## 2018-05-30 DIAGNOSIS — J449 Chronic obstructive pulmonary disease, unspecified: Secondary | ICD-10-CM | POA: Diagnosis not present

## 2018-05-30 DIAGNOSIS — R2243 Localized swelling, mass and lump, lower limb, bilateral: Secondary | ICD-10-CM | POA: Diagnosis not present

## 2018-05-30 DIAGNOSIS — I110 Hypertensive heart disease with heart failure: Secondary | ICD-10-CM | POA: Diagnosis not present

## 2018-05-30 DIAGNOSIS — L02416 Cutaneous abscess of left lower limb: Secondary | ICD-10-CM | POA: Diagnosis not present

## 2018-05-30 DIAGNOSIS — L03116 Cellulitis of left lower limb: Secondary | ICD-10-CM | POA: Diagnosis not present

## 2018-05-30 DIAGNOSIS — L97821 Non-pressure chronic ulcer of other part of left lower leg limited to breakdown of skin: Secondary | ICD-10-CM | POA: Diagnosis not present

## 2018-05-31 DIAGNOSIS — I509 Heart failure, unspecified: Secondary | ICD-10-CM | POA: Diagnosis not present

## 2018-05-31 DIAGNOSIS — I110 Hypertensive heart disease with heart failure: Secondary | ICD-10-CM | POA: Diagnosis not present

## 2018-05-31 DIAGNOSIS — L03116 Cellulitis of left lower limb: Secondary | ICD-10-CM | POA: Diagnosis not present

## 2018-05-31 DIAGNOSIS — I872 Venous insufficiency (chronic) (peripheral): Secondary | ICD-10-CM | POA: Diagnosis not present

## 2018-05-31 DIAGNOSIS — L97821 Non-pressure chronic ulcer of other part of left lower leg limited to breakdown of skin: Secondary | ICD-10-CM | POA: Diagnosis not present

## 2018-05-31 DIAGNOSIS — L02416 Cutaneous abscess of left lower limb: Secondary | ICD-10-CM | POA: Diagnosis not present

## 2018-06-01 DIAGNOSIS — I872 Venous insufficiency (chronic) (peripheral): Secondary | ICD-10-CM | POA: Diagnosis not present

## 2018-06-01 DIAGNOSIS — I110 Hypertensive heart disease with heart failure: Secondary | ICD-10-CM | POA: Diagnosis not present

## 2018-06-01 DIAGNOSIS — L97821 Non-pressure chronic ulcer of other part of left lower leg limited to breakdown of skin: Secondary | ICD-10-CM | POA: Diagnosis not present

## 2018-06-01 DIAGNOSIS — L02416 Cutaneous abscess of left lower limb: Secondary | ICD-10-CM | POA: Diagnosis not present

## 2018-06-01 DIAGNOSIS — I509 Heart failure, unspecified: Secondary | ICD-10-CM | POA: Diagnosis not present

## 2018-06-01 DIAGNOSIS — L03116 Cellulitis of left lower limb: Secondary | ICD-10-CM | POA: Diagnosis not present

## 2018-06-02 DIAGNOSIS — L02416 Cutaneous abscess of left lower limb: Secondary | ICD-10-CM | POA: Diagnosis not present

## 2018-06-02 DIAGNOSIS — L97821 Non-pressure chronic ulcer of other part of left lower leg limited to breakdown of skin: Secondary | ICD-10-CM | POA: Diagnosis not present

## 2018-06-02 DIAGNOSIS — I872 Venous insufficiency (chronic) (peripheral): Secondary | ICD-10-CM | POA: Diagnosis not present

## 2018-06-02 DIAGNOSIS — L03116 Cellulitis of left lower limb: Secondary | ICD-10-CM | POA: Diagnosis not present

## 2018-06-02 DIAGNOSIS — I110 Hypertensive heart disease with heart failure: Secondary | ICD-10-CM | POA: Diagnosis not present

## 2018-06-02 DIAGNOSIS — I509 Heart failure, unspecified: Secondary | ICD-10-CM | POA: Diagnosis not present

## 2018-06-04 DIAGNOSIS — R52 Pain, unspecified: Secondary | ICD-10-CM | POA: Diagnosis not present

## 2018-06-04 DIAGNOSIS — R609 Edema, unspecified: Secondary | ICD-10-CM | POA: Diagnosis not present

## 2018-06-04 DIAGNOSIS — F5104 Psychophysiologic insomnia: Secondary | ICD-10-CM | POA: Diagnosis not present

## 2018-06-04 DIAGNOSIS — L03116 Cellulitis of left lower limb: Secondary | ICD-10-CM | POA: Diagnosis not present

## 2018-06-06 DIAGNOSIS — L03116 Cellulitis of left lower limb: Secondary | ICD-10-CM | POA: Diagnosis not present

## 2018-06-06 DIAGNOSIS — I872 Venous insufficiency (chronic) (peripheral): Secondary | ICD-10-CM | POA: Diagnosis not present

## 2018-06-06 DIAGNOSIS — L97821 Non-pressure chronic ulcer of other part of left lower leg limited to breakdown of skin: Secondary | ICD-10-CM | POA: Diagnosis not present

## 2018-06-06 DIAGNOSIS — L02416 Cutaneous abscess of left lower limb: Secondary | ICD-10-CM | POA: Diagnosis not present

## 2018-06-06 DIAGNOSIS — I509 Heart failure, unspecified: Secondary | ICD-10-CM | POA: Diagnosis not present

## 2018-06-06 DIAGNOSIS — I110 Hypertensive heart disease with heart failure: Secondary | ICD-10-CM | POA: Diagnosis not present

## 2018-06-09 DIAGNOSIS — R609 Edema, unspecified: Secondary | ICD-10-CM | POA: Diagnosis not present

## 2018-06-09 DIAGNOSIS — I872 Venous insufficiency (chronic) (peripheral): Secondary | ICD-10-CM | POA: Diagnosis not present

## 2018-06-09 DIAGNOSIS — L03116 Cellulitis of left lower limb: Secondary | ICD-10-CM | POA: Diagnosis not present

## 2018-06-09 DIAGNOSIS — F5104 Psychophysiologic insomnia: Secondary | ICD-10-CM | POA: Diagnosis not present

## 2018-06-09 DIAGNOSIS — L97821 Non-pressure chronic ulcer of other part of left lower leg limited to breakdown of skin: Secondary | ICD-10-CM | POA: Diagnosis not present

## 2018-06-09 DIAGNOSIS — I110 Hypertensive heart disease with heart failure: Secondary | ICD-10-CM | POA: Diagnosis not present

## 2018-06-09 DIAGNOSIS — L02416 Cutaneous abscess of left lower limb: Secondary | ICD-10-CM | POA: Diagnosis not present

## 2018-06-09 DIAGNOSIS — R0602 Shortness of breath: Secondary | ICD-10-CM | POA: Diagnosis not present

## 2018-06-09 DIAGNOSIS — I509 Heart failure, unspecified: Secondary | ICD-10-CM | POA: Diagnosis not present

## 2018-06-11 DIAGNOSIS — I110 Hypertensive heart disease with heart failure: Secondary | ICD-10-CM | POA: Diagnosis not present

## 2018-06-11 DIAGNOSIS — Z79890 Hormone replacement therapy (postmenopausal): Secondary | ICD-10-CM | POA: Diagnosis not present

## 2018-06-11 DIAGNOSIS — I4892 Unspecified atrial flutter: Secondary | ICD-10-CM | POA: Diagnosis not present

## 2018-06-11 DIAGNOSIS — R0600 Dyspnea, unspecified: Secondary | ICD-10-CM | POA: Diagnosis not present

## 2018-06-11 DIAGNOSIS — Z7982 Long term (current) use of aspirin: Secondary | ICD-10-CM | POA: Diagnosis not present

## 2018-06-11 DIAGNOSIS — I4510 Unspecified right bundle-branch block: Secondary | ICD-10-CM | POA: Diagnosis not present

## 2018-06-11 DIAGNOSIS — I4891 Unspecified atrial fibrillation: Secondary | ICD-10-CM | POA: Diagnosis not present

## 2018-06-11 DIAGNOSIS — I509 Heart failure, unspecified: Secondary | ICD-10-CM | POA: Diagnosis not present

## 2018-06-11 DIAGNOSIS — R9431 Abnormal electrocardiogram [ECG] [EKG]: Secondary | ICD-10-CM | POA: Diagnosis not present

## 2018-06-13 DIAGNOSIS — I110 Hypertensive heart disease with heart failure: Secondary | ICD-10-CM | POA: Diagnosis not present

## 2018-06-13 DIAGNOSIS — I509 Heart failure, unspecified: Secondary | ICD-10-CM | POA: Diagnosis not present

## 2018-06-13 DIAGNOSIS — L03116 Cellulitis of left lower limb: Secondary | ICD-10-CM | POA: Diagnosis not present

## 2018-06-13 DIAGNOSIS — L02416 Cutaneous abscess of left lower limb: Secondary | ICD-10-CM | POA: Diagnosis not present

## 2018-06-13 DIAGNOSIS — L97821 Non-pressure chronic ulcer of other part of left lower leg limited to breakdown of skin: Secondary | ICD-10-CM | POA: Diagnosis not present

## 2018-06-13 DIAGNOSIS — I872 Venous insufficiency (chronic) (peripheral): Secondary | ICD-10-CM | POA: Diagnosis not present

## 2018-06-16 DIAGNOSIS — I872 Venous insufficiency (chronic) (peripheral): Secondary | ICD-10-CM | POA: Diagnosis not present

## 2018-06-16 DIAGNOSIS — I509 Heart failure, unspecified: Secondary | ICD-10-CM | POA: Diagnosis not present

## 2018-06-16 DIAGNOSIS — L97821 Non-pressure chronic ulcer of other part of left lower leg limited to breakdown of skin: Secondary | ICD-10-CM | POA: Diagnosis not present

## 2018-06-16 DIAGNOSIS — L02416 Cutaneous abscess of left lower limb: Secondary | ICD-10-CM | POA: Diagnosis not present

## 2018-06-16 DIAGNOSIS — I110 Hypertensive heart disease with heart failure: Secondary | ICD-10-CM | POA: Diagnosis not present

## 2018-06-16 DIAGNOSIS — L03116 Cellulitis of left lower limb: Secondary | ICD-10-CM | POA: Diagnosis not present

## 2018-06-17 DIAGNOSIS — L97928 Non-pressure chronic ulcer of unspecified part of left lower leg with other specified severity: Secondary | ICD-10-CM | POA: Diagnosis not present

## 2018-06-17 DIAGNOSIS — I83028 Varicose veins of left lower extremity with ulcer other part of lower leg: Secondary | ICD-10-CM | POA: Diagnosis not present

## 2018-06-18 DIAGNOSIS — I872 Venous insufficiency (chronic) (peripheral): Secondary | ICD-10-CM | POA: Diagnosis not present

## 2018-06-18 DIAGNOSIS — I509 Heart failure, unspecified: Secondary | ICD-10-CM | POA: Diagnosis not present

## 2018-06-18 DIAGNOSIS — R609 Edema, unspecified: Secondary | ICD-10-CM | POA: Diagnosis not present

## 2018-06-18 DIAGNOSIS — F5104 Psychophysiologic insomnia: Secondary | ICD-10-CM | POA: Diagnosis not present

## 2018-06-18 DIAGNOSIS — L03116 Cellulitis of left lower limb: Secondary | ICD-10-CM | POA: Diagnosis not present

## 2018-06-18 DIAGNOSIS — L97821 Non-pressure chronic ulcer of other part of left lower leg limited to breakdown of skin: Secondary | ICD-10-CM | POA: Diagnosis not present

## 2018-06-18 DIAGNOSIS — F418 Other specified anxiety disorders: Secondary | ICD-10-CM | POA: Diagnosis not present

## 2018-06-18 DIAGNOSIS — L02416 Cutaneous abscess of left lower limb: Secondary | ICD-10-CM | POA: Diagnosis not present

## 2018-06-18 DIAGNOSIS — I110 Hypertensive heart disease with heart failure: Secondary | ICD-10-CM | POA: Diagnosis not present

## 2018-06-18 DIAGNOSIS — I5032 Chronic diastolic (congestive) heart failure: Secondary | ICD-10-CM | POA: Diagnosis not present

## 2018-06-19 DIAGNOSIS — L02416 Cutaneous abscess of left lower limb: Secondary | ICD-10-CM | POA: Diagnosis not present

## 2018-06-19 DIAGNOSIS — L97821 Non-pressure chronic ulcer of other part of left lower leg limited to breakdown of skin: Secondary | ICD-10-CM | POA: Diagnosis not present

## 2018-06-19 DIAGNOSIS — I872 Venous insufficiency (chronic) (peripheral): Secondary | ICD-10-CM | POA: Diagnosis not present

## 2018-06-19 DIAGNOSIS — L03116 Cellulitis of left lower limb: Secondary | ICD-10-CM | POA: Diagnosis not present

## 2018-06-19 DIAGNOSIS — I509 Heart failure, unspecified: Secondary | ICD-10-CM | POA: Diagnosis not present

## 2018-06-19 DIAGNOSIS — I110 Hypertensive heart disease with heart failure: Secondary | ICD-10-CM | POA: Diagnosis not present

## 2018-06-20 DIAGNOSIS — I110 Hypertensive heart disease with heart failure: Secondary | ICD-10-CM | POA: Diagnosis not present

## 2018-06-20 DIAGNOSIS — L03116 Cellulitis of left lower limb: Secondary | ICD-10-CM | POA: Diagnosis not present

## 2018-06-20 DIAGNOSIS — L02416 Cutaneous abscess of left lower limb: Secondary | ICD-10-CM | POA: Diagnosis not present

## 2018-06-20 DIAGNOSIS — I872 Venous insufficiency (chronic) (peripheral): Secondary | ICD-10-CM | POA: Diagnosis not present

## 2018-06-20 DIAGNOSIS — L97821 Non-pressure chronic ulcer of other part of left lower leg limited to breakdown of skin: Secondary | ICD-10-CM | POA: Diagnosis not present

## 2018-06-20 DIAGNOSIS — I509 Heart failure, unspecified: Secondary | ICD-10-CM | POA: Diagnosis not present

## 2018-06-22 DIAGNOSIS — L02416 Cutaneous abscess of left lower limb: Secondary | ICD-10-CM | POA: Diagnosis not present

## 2018-06-22 DIAGNOSIS — L97821 Non-pressure chronic ulcer of other part of left lower leg limited to breakdown of skin: Secondary | ICD-10-CM | POA: Diagnosis not present

## 2018-06-22 DIAGNOSIS — I872 Venous insufficiency (chronic) (peripheral): Secondary | ICD-10-CM | POA: Diagnosis not present

## 2018-06-22 DIAGNOSIS — L03116 Cellulitis of left lower limb: Secondary | ICD-10-CM | POA: Diagnosis not present

## 2018-06-22 DIAGNOSIS — I509 Heart failure, unspecified: Secondary | ICD-10-CM | POA: Diagnosis not present

## 2018-06-22 DIAGNOSIS — I110 Hypertensive heart disease with heart failure: Secondary | ICD-10-CM | POA: Diagnosis not present

## 2018-06-24 DIAGNOSIS — I509 Heart failure, unspecified: Secondary | ICD-10-CM | POA: Diagnosis not present

## 2018-06-24 DIAGNOSIS — I110 Hypertensive heart disease with heart failure: Secondary | ICD-10-CM | POA: Diagnosis not present

## 2018-06-24 DIAGNOSIS — I872 Venous insufficiency (chronic) (peripheral): Secondary | ICD-10-CM | POA: Diagnosis not present

## 2018-06-24 DIAGNOSIS — L97821 Non-pressure chronic ulcer of other part of left lower leg limited to breakdown of skin: Secondary | ICD-10-CM | POA: Diagnosis not present

## 2018-06-24 DIAGNOSIS — L02416 Cutaneous abscess of left lower limb: Secondary | ICD-10-CM | POA: Diagnosis not present

## 2018-06-24 DIAGNOSIS — L03116 Cellulitis of left lower limb: Secondary | ICD-10-CM | POA: Diagnosis not present

## 2018-06-25 DIAGNOSIS — R609 Edema, unspecified: Secondary | ICD-10-CM | POA: Diagnosis not present

## 2018-06-25 DIAGNOSIS — I5032 Chronic diastolic (congestive) heart failure: Secondary | ICD-10-CM | POA: Diagnosis not present

## 2018-06-25 DIAGNOSIS — L03116 Cellulitis of left lower limb: Secondary | ICD-10-CM | POA: Diagnosis not present

## 2018-06-25 DIAGNOSIS — F5104 Psychophysiologic insomnia: Secondary | ICD-10-CM | POA: Diagnosis not present

## 2018-06-26 DIAGNOSIS — I110 Hypertensive heart disease with heart failure: Secondary | ICD-10-CM | POA: Diagnosis not present

## 2018-06-26 DIAGNOSIS — L03116 Cellulitis of left lower limb: Secondary | ICD-10-CM | POA: Diagnosis not present

## 2018-06-26 DIAGNOSIS — I872 Venous insufficiency (chronic) (peripheral): Secondary | ICD-10-CM | POA: Diagnosis not present

## 2018-06-26 DIAGNOSIS — L97821 Non-pressure chronic ulcer of other part of left lower leg limited to breakdown of skin: Secondary | ICD-10-CM | POA: Diagnosis not present

## 2018-06-26 DIAGNOSIS — L02416 Cutaneous abscess of left lower limb: Secondary | ICD-10-CM | POA: Diagnosis not present

## 2018-06-26 DIAGNOSIS — I509 Heart failure, unspecified: Secondary | ICD-10-CM | POA: Diagnosis not present

## 2018-06-30 DIAGNOSIS — R0609 Other forms of dyspnea: Secondary | ICD-10-CM | POA: Diagnosis not present

## 2018-07-01 DIAGNOSIS — L03116 Cellulitis of left lower limb: Secondary | ICD-10-CM | POA: Diagnosis not present

## 2018-07-01 DIAGNOSIS — I872 Venous insufficiency (chronic) (peripheral): Secondary | ICD-10-CM | POA: Diagnosis not present

## 2018-07-01 DIAGNOSIS — L97821 Non-pressure chronic ulcer of other part of left lower leg limited to breakdown of skin: Secondary | ICD-10-CM | POA: Diagnosis not present

## 2018-07-01 DIAGNOSIS — I509 Heart failure, unspecified: Secondary | ICD-10-CM | POA: Diagnosis not present

## 2018-07-01 DIAGNOSIS — I110 Hypertensive heart disease with heart failure: Secondary | ICD-10-CM | POA: Diagnosis not present

## 2018-07-01 DIAGNOSIS — L02416 Cutaneous abscess of left lower limb: Secondary | ICD-10-CM | POA: Diagnosis not present

## 2018-07-02 DIAGNOSIS — K5901 Slow transit constipation: Secondary | ICD-10-CM | POA: Diagnosis not present

## 2018-07-02 DIAGNOSIS — R609 Edema, unspecified: Secondary | ICD-10-CM | POA: Diagnosis not present

## 2018-07-02 DIAGNOSIS — I5032 Chronic diastolic (congestive) heart failure: Secondary | ICD-10-CM | POA: Diagnosis not present

## 2018-07-02 DIAGNOSIS — F418 Other specified anxiety disorders: Secondary | ICD-10-CM | POA: Diagnosis not present

## 2018-07-03 DIAGNOSIS — L97821 Non-pressure chronic ulcer of other part of left lower leg limited to breakdown of skin: Secondary | ICD-10-CM | POA: Diagnosis not present

## 2018-07-03 DIAGNOSIS — L02416 Cutaneous abscess of left lower limb: Secondary | ICD-10-CM | POA: Diagnosis not present

## 2018-07-03 DIAGNOSIS — L03116 Cellulitis of left lower limb: Secondary | ICD-10-CM | POA: Diagnosis not present

## 2018-07-03 DIAGNOSIS — I872 Venous insufficiency (chronic) (peripheral): Secondary | ICD-10-CM | POA: Diagnosis not present

## 2018-07-03 DIAGNOSIS — I110 Hypertensive heart disease with heart failure: Secondary | ICD-10-CM | POA: Diagnosis not present

## 2018-07-03 DIAGNOSIS — I509 Heart failure, unspecified: Secondary | ICD-10-CM | POA: Diagnosis not present

## 2018-07-03 DIAGNOSIS — R0602 Shortness of breath: Secondary | ICD-10-CM | POA: Diagnosis not present

## 2018-07-04 DIAGNOSIS — L03116 Cellulitis of left lower limb: Secondary | ICD-10-CM | POA: Diagnosis not present

## 2018-07-04 DIAGNOSIS — I110 Hypertensive heart disease with heart failure: Secondary | ICD-10-CM | POA: Diagnosis not present

## 2018-07-04 DIAGNOSIS — L02416 Cutaneous abscess of left lower limb: Secondary | ICD-10-CM | POA: Diagnosis not present

## 2018-07-04 DIAGNOSIS — I872 Venous insufficiency (chronic) (peripheral): Secondary | ICD-10-CM | POA: Diagnosis not present

## 2018-07-04 DIAGNOSIS — I509 Heart failure, unspecified: Secondary | ICD-10-CM | POA: Diagnosis not present

## 2018-07-04 DIAGNOSIS — L97821 Non-pressure chronic ulcer of other part of left lower leg limited to breakdown of skin: Secondary | ICD-10-CM | POA: Diagnosis not present

## 2018-07-06 DIAGNOSIS — I110 Hypertensive heart disease with heart failure: Secondary | ICD-10-CM | POA: Diagnosis not present

## 2018-07-06 DIAGNOSIS — L02416 Cutaneous abscess of left lower limb: Secondary | ICD-10-CM | POA: Diagnosis not present

## 2018-07-06 DIAGNOSIS — L03116 Cellulitis of left lower limb: Secondary | ICD-10-CM | POA: Diagnosis not present

## 2018-07-06 DIAGNOSIS — I509 Heart failure, unspecified: Secondary | ICD-10-CM | POA: Diagnosis not present

## 2018-07-06 DIAGNOSIS — I872 Venous insufficiency (chronic) (peripheral): Secondary | ICD-10-CM | POA: Diagnosis not present

## 2018-07-06 DIAGNOSIS — L97821 Non-pressure chronic ulcer of other part of left lower leg limited to breakdown of skin: Secondary | ICD-10-CM | POA: Diagnosis not present

## 2018-07-07 DIAGNOSIS — I509 Heart failure, unspecified: Secondary | ICD-10-CM | POA: Diagnosis not present

## 2018-07-07 DIAGNOSIS — I110 Hypertensive heart disease with heart failure: Secondary | ICD-10-CM | POA: Diagnosis not present

## 2018-07-07 DIAGNOSIS — K5901 Slow transit constipation: Secondary | ICD-10-CM | POA: Diagnosis not present

## 2018-07-07 DIAGNOSIS — I872 Venous insufficiency (chronic) (peripheral): Secondary | ICD-10-CM | POA: Diagnosis not present

## 2018-07-07 DIAGNOSIS — L97821 Non-pressure chronic ulcer of other part of left lower leg limited to breakdown of skin: Secondary | ICD-10-CM | POA: Diagnosis not present

## 2018-07-07 DIAGNOSIS — I5032 Chronic diastolic (congestive) heart failure: Secondary | ICD-10-CM | POA: Diagnosis not present

## 2018-07-07 DIAGNOSIS — L02416 Cutaneous abscess of left lower limb: Secondary | ICD-10-CM | POA: Diagnosis not present

## 2018-07-07 DIAGNOSIS — L03116 Cellulitis of left lower limb: Secondary | ICD-10-CM | POA: Diagnosis not present

## 2018-07-07 DIAGNOSIS — R0602 Shortness of breath: Secondary | ICD-10-CM | POA: Diagnosis not present

## 2018-07-07 DIAGNOSIS — R609 Edema, unspecified: Secondary | ICD-10-CM | POA: Diagnosis not present

## 2018-07-08 DIAGNOSIS — I509 Heart failure, unspecified: Secondary | ICD-10-CM | POA: Diagnosis not present

## 2018-07-08 DIAGNOSIS — I872 Venous insufficiency (chronic) (peripheral): Secondary | ICD-10-CM | POA: Diagnosis not present

## 2018-07-08 DIAGNOSIS — L97821 Non-pressure chronic ulcer of other part of left lower leg limited to breakdown of skin: Secondary | ICD-10-CM | POA: Diagnosis not present

## 2018-07-08 DIAGNOSIS — I110 Hypertensive heart disease with heart failure: Secondary | ICD-10-CM | POA: Diagnosis not present

## 2018-07-08 DIAGNOSIS — L02416 Cutaneous abscess of left lower limb: Secondary | ICD-10-CM | POA: Diagnosis not present

## 2018-07-08 DIAGNOSIS — L03116 Cellulitis of left lower limb: Secondary | ICD-10-CM | POA: Diagnosis not present

## 2018-07-09 DIAGNOSIS — I872 Venous insufficiency (chronic) (peripheral): Secondary | ICD-10-CM | POA: Diagnosis not present

## 2018-07-09 DIAGNOSIS — I110 Hypertensive heart disease with heart failure: Secondary | ICD-10-CM | POA: Diagnosis not present

## 2018-07-09 DIAGNOSIS — L97821 Non-pressure chronic ulcer of other part of left lower leg limited to breakdown of skin: Secondary | ICD-10-CM | POA: Diagnosis not present

## 2018-07-09 DIAGNOSIS — L03116 Cellulitis of left lower limb: Secondary | ICD-10-CM | POA: Diagnosis not present

## 2018-07-09 DIAGNOSIS — I509 Heart failure, unspecified: Secondary | ICD-10-CM | POA: Diagnosis not present

## 2018-07-09 DIAGNOSIS — L02416 Cutaneous abscess of left lower limb: Secondary | ICD-10-CM | POA: Diagnosis not present

## 2018-07-11 DIAGNOSIS — E440 Moderate protein-calorie malnutrition: Secondary | ICD-10-CM | POA: Diagnosis not present

## 2018-07-11 DIAGNOSIS — M1990 Unspecified osteoarthritis, unspecified site: Secondary | ICD-10-CM | POA: Diagnosis not present

## 2018-07-11 DIAGNOSIS — E785 Hyperlipidemia, unspecified: Secondary | ICD-10-CM | POA: Diagnosis present

## 2018-07-11 DIAGNOSIS — L97821 Non-pressure chronic ulcer of other part of left lower leg limited to breakdown of skin: Secondary | ICD-10-CM | POA: Diagnosis not present

## 2018-07-11 DIAGNOSIS — R410 Disorientation, unspecified: Secondary | ICD-10-CM | POA: Diagnosis not present

## 2018-07-11 DIAGNOSIS — I4510 Unspecified right bundle-branch block: Secondary | ICD-10-CM | POA: Diagnosis not present

## 2018-07-11 DIAGNOSIS — I080 Rheumatic disorders of both mitral and aortic valves: Secondary | ICD-10-CM | POA: Diagnosis not present

## 2018-07-11 DIAGNOSIS — I509 Heart failure, unspecified: Secondary | ICD-10-CM | POA: Diagnosis not present

## 2018-07-11 DIAGNOSIS — R531 Weakness: Secondary | ICD-10-CM | POA: Diagnosis not present

## 2018-07-11 DIAGNOSIS — M549 Dorsalgia, unspecified: Secondary | ICD-10-CM | POA: Diagnosis not present

## 2018-07-11 DIAGNOSIS — I361 Nonrheumatic tricuspid (valve) insufficiency: Secondary | ICD-10-CM | POA: Diagnosis not present

## 2018-07-11 DIAGNOSIS — G9341 Metabolic encephalopathy: Secondary | ICD-10-CM | POA: Diagnosis not present

## 2018-07-11 DIAGNOSIS — Z79890 Hormone replacement therapy (postmenopausal): Secondary | ICD-10-CM | POA: Diagnosis not present

## 2018-07-11 DIAGNOSIS — Z85828 Personal history of other malignant neoplasm of skin: Secondary | ICD-10-CM | POA: Diagnosis not present

## 2018-07-11 DIAGNOSIS — M810 Age-related osteoporosis without current pathological fracture: Secondary | ICD-10-CM | POA: Diagnosis present

## 2018-07-11 DIAGNOSIS — R5381 Other malaise: Secondary | ICD-10-CM | POA: Diagnosis not present

## 2018-07-11 DIAGNOSIS — M2550 Pain in unspecified joint: Secondary | ICD-10-CM | POA: Diagnosis not present

## 2018-07-11 DIAGNOSIS — I340 Nonrheumatic mitral (valve) insufficiency: Secondary | ICD-10-CM | POA: Diagnosis not present

## 2018-07-11 DIAGNOSIS — R2689 Other abnormalities of gait and mobility: Secondary | ICD-10-CM | POA: Diagnosis not present

## 2018-07-11 DIAGNOSIS — I43 Cardiomyopathy in diseases classified elsewhere: Secondary | ICD-10-CM | POA: Diagnosis not present

## 2018-07-11 DIAGNOSIS — J9610 Chronic respiratory failure, unspecified whether with hypoxia or hypercapnia: Secondary | ICD-10-CM | POA: Diagnosis not present

## 2018-07-11 DIAGNOSIS — H409 Unspecified glaucoma: Secondary | ICD-10-CM | POA: Diagnosis not present

## 2018-07-11 DIAGNOSIS — Z66 Do not resuscitate: Secondary | ICD-10-CM | POA: Diagnosis not present

## 2018-07-11 DIAGNOSIS — I50813 Acute on chronic right heart failure: Secondary | ICD-10-CM | POA: Diagnosis not present

## 2018-07-11 DIAGNOSIS — I081 Rheumatic disorders of both mitral and tricuspid valves: Secondary | ICD-10-CM | POA: Diagnosis present

## 2018-07-11 DIAGNOSIS — I110 Hypertensive heart disease with heart failure: Secondary | ICD-10-CM | POA: Diagnosis present

## 2018-07-11 DIAGNOSIS — A419 Sepsis, unspecified organism: Secondary | ICD-10-CM | POA: Diagnosis present

## 2018-07-11 DIAGNOSIS — M5137 Other intervertebral disc degeneration, lumbosacral region: Secondary | ICD-10-CM | POA: Diagnosis present

## 2018-07-11 DIAGNOSIS — I9589 Other hypotension: Secondary | ICD-10-CM | POA: Diagnosis not present

## 2018-07-11 DIAGNOSIS — R0600 Dyspnea, unspecified: Secondary | ICD-10-CM | POA: Diagnosis not present

## 2018-07-11 DIAGNOSIS — R7989 Other specified abnormal findings of blood chemistry: Secondary | ICD-10-CM | POA: Diagnosis not present

## 2018-07-11 DIAGNOSIS — J918 Pleural effusion in other conditions classified elsewhere: Secondary | ICD-10-CM | POA: Diagnosis not present

## 2018-07-11 DIAGNOSIS — F419 Anxiety disorder, unspecified: Secondary | ICD-10-CM | POA: Diagnosis not present

## 2018-07-11 DIAGNOSIS — A499 Bacterial infection, unspecified: Secondary | ICD-10-CM | POA: Diagnosis not present

## 2018-07-11 DIAGNOSIS — G8929 Other chronic pain: Secondary | ICD-10-CM | POA: Diagnosis present

## 2018-07-11 DIAGNOSIS — Z79899 Other long term (current) drug therapy: Secondary | ICD-10-CM | POA: Diagnosis not present

## 2018-07-11 DIAGNOSIS — I4581 Long QT syndrome: Secondary | ICD-10-CM | POA: Diagnosis not present

## 2018-07-11 DIAGNOSIS — I10 Essential (primary) hypertension: Secondary | ICD-10-CM | POA: Diagnosis not present

## 2018-07-11 DIAGNOSIS — I440 Atrioventricular block, first degree: Secondary | ICD-10-CM | POA: Diagnosis not present

## 2018-07-11 DIAGNOSIS — H3530 Unspecified macular degeneration: Secondary | ICD-10-CM | POA: Diagnosis present

## 2018-07-11 DIAGNOSIS — R0602 Shortness of breath: Secondary | ICD-10-CM | POA: Diagnosis not present

## 2018-07-11 DIAGNOSIS — I517 Cardiomegaly: Secondary | ICD-10-CM | POA: Diagnosis not present

## 2018-07-11 DIAGNOSIS — Z7982 Long term (current) use of aspirin: Secondary | ICD-10-CM | POA: Diagnosis not present

## 2018-07-11 DIAGNOSIS — I959 Hypotension, unspecified: Secondary | ICD-10-CM | POA: Diagnosis present

## 2018-07-11 DIAGNOSIS — K219 Gastro-esophageal reflux disease without esophagitis: Secondary | ICD-10-CM | POA: Diagnosis not present

## 2018-07-11 DIAGNOSIS — Z0189 Encounter for other specified special examinations: Secondary | ICD-10-CM | POA: Diagnosis not present

## 2018-07-11 DIAGNOSIS — E039 Hypothyroidism, unspecified: Secondary | ICD-10-CM | POA: Diagnosis present

## 2018-07-11 DIAGNOSIS — I272 Pulmonary hypertension, unspecified: Secondary | ICD-10-CM | POA: Diagnosis present

## 2018-07-11 DIAGNOSIS — I5023 Acute on chronic systolic (congestive) heart failure: Secondary | ICD-10-CM | POA: Diagnosis not present

## 2018-07-11 DIAGNOSIS — I445 Left posterior fascicular block: Secondary | ICD-10-CM | POA: Diagnosis not present

## 2018-07-11 DIAGNOSIS — M6281 Muscle weakness (generalized): Secondary | ICD-10-CM | POA: Diagnosis not present

## 2018-07-11 DIAGNOSIS — N179 Acute kidney failure, unspecified: Secondary | ICD-10-CM | POA: Diagnosis not present

## 2018-07-11 DIAGNOSIS — I872 Venous insufficiency (chronic) (peripheral): Secondary | ICD-10-CM | POA: Diagnosis not present

## 2018-07-11 DIAGNOSIS — R4182 Altered mental status, unspecified: Secondary | ICD-10-CM | POA: Diagnosis not present

## 2018-07-11 DIAGNOSIS — N390 Urinary tract infection, site not specified: Secondary | ICD-10-CM | POA: Diagnosis not present

## 2018-07-11 DIAGNOSIS — R54 Age-related physical debility: Secondary | ICD-10-CM | POA: Diagnosis not present

## 2018-07-11 DIAGNOSIS — Z880 Allergy status to penicillin: Secondary | ICD-10-CM | POA: Diagnosis not present

## 2018-07-11 DIAGNOSIS — I38 Endocarditis, valve unspecified: Secondary | ICD-10-CM | POA: Diagnosis not present

## 2018-07-11 DIAGNOSIS — M5136 Other intervertebral disc degeneration, lumbar region: Secondary | ICD-10-CM | POA: Diagnosis present

## 2018-07-11 DIAGNOSIS — R600 Localized edema: Secondary | ICD-10-CM | POA: Diagnosis not present

## 2018-07-11 DIAGNOSIS — R609 Edema, unspecified: Secondary | ICD-10-CM | POA: Diagnosis not present

## 2018-07-11 DIAGNOSIS — Z791 Long term (current) use of non-steroidal anti-inflammatories (NSAID): Secondary | ICD-10-CM | POA: Diagnosis not present

## 2018-07-11 DIAGNOSIS — I700 Atherosclerosis of aorta: Secondary | ICD-10-CM | POA: Diagnosis not present

## 2018-07-11 DIAGNOSIS — I5022 Chronic systolic (congestive) heart failure: Secondary | ICD-10-CM | POA: Diagnosis not present

## 2018-07-11 DIAGNOSIS — R9431 Abnormal electrocardiogram [ECG] [EKG]: Secondary | ICD-10-CM | POA: Diagnosis not present

## 2018-07-11 DIAGNOSIS — G4700 Insomnia, unspecified: Secondary | ICD-10-CM | POA: Diagnosis present

## 2018-07-11 DIAGNOSIS — Z515 Encounter for palliative care: Secondary | ICD-10-CM | POA: Diagnosis not present

## 2018-07-11 DIAGNOSIS — L03116 Cellulitis of left lower limb: Secondary | ICD-10-CM | POA: Diagnosis present

## 2018-07-11 DIAGNOSIS — L02416 Cutaneous abscess of left lower limb: Secondary | ICD-10-CM | POA: Diagnosis not present

## 2018-07-17 DIAGNOSIS — H409 Unspecified glaucoma: Secondary | ICD-10-CM | POA: Diagnosis not present

## 2018-07-17 DIAGNOSIS — R5381 Other malaise: Secondary | ICD-10-CM | POA: Diagnosis not present

## 2018-07-17 DIAGNOSIS — R410 Disorientation, unspecified: Secondary | ICD-10-CM | POA: Diagnosis not present

## 2018-07-17 DIAGNOSIS — M5137 Other intervertebral disc degeneration, lumbosacral region: Secondary | ICD-10-CM | POA: Diagnosis not present

## 2018-07-17 DIAGNOSIS — I445 Left posterior fascicular block: Secondary | ICD-10-CM | POA: Diagnosis not present

## 2018-07-17 DIAGNOSIS — I50813 Acute on chronic right heart failure: Secondary | ICD-10-CM | POA: Diagnosis not present

## 2018-07-17 DIAGNOSIS — I10 Essential (primary) hypertension: Secondary | ICD-10-CM | POA: Diagnosis not present

## 2018-07-17 DIAGNOSIS — Z7189 Other specified counseling: Secondary | ICD-10-CM | POA: Diagnosis not present

## 2018-07-17 DIAGNOSIS — I43 Cardiomyopathy in diseases classified elsewhere: Secondary | ICD-10-CM | POA: Diagnosis not present

## 2018-07-17 DIAGNOSIS — I071 Rheumatic tricuspid insufficiency: Secondary | ICD-10-CM | POA: Diagnosis not present

## 2018-07-17 DIAGNOSIS — I340 Nonrheumatic mitral (valve) insufficiency: Secondary | ICD-10-CM | POA: Diagnosis not present

## 2018-07-17 DIAGNOSIS — R600 Localized edema: Secondary | ICD-10-CM | POA: Diagnosis not present

## 2018-07-17 DIAGNOSIS — E039 Hypothyroidism, unspecified: Secondary | ICD-10-CM | POA: Diagnosis not present

## 2018-07-17 DIAGNOSIS — G894 Chronic pain syndrome: Secondary | ICD-10-CM | POA: Diagnosis not present

## 2018-07-17 DIAGNOSIS — I509 Heart failure, unspecified: Secondary | ICD-10-CM | POA: Diagnosis not present

## 2018-07-17 DIAGNOSIS — I270 Primary pulmonary hypertension: Secondary | ICD-10-CM | POA: Diagnosis not present

## 2018-07-17 DIAGNOSIS — A499 Bacterial infection, unspecified: Secondary | ICD-10-CM | POA: Diagnosis not present

## 2018-07-17 DIAGNOSIS — L03116 Cellulitis of left lower limb: Secondary | ICD-10-CM | POA: Diagnosis not present

## 2018-07-17 DIAGNOSIS — F419 Anxiety disorder, unspecified: Secondary | ICD-10-CM | POA: Diagnosis not present

## 2018-07-17 DIAGNOSIS — M1990 Unspecified osteoarthritis, unspecified site: Secondary | ICD-10-CM | POA: Diagnosis not present

## 2018-07-17 DIAGNOSIS — R9431 Abnormal electrocardiogram [ECG] [EKG]: Secondary | ICD-10-CM | POA: Diagnosis not present

## 2018-07-17 DIAGNOSIS — I517 Cardiomegaly: Secondary | ICD-10-CM | POA: Diagnosis not present

## 2018-07-17 DIAGNOSIS — A419 Sepsis, unspecified organism: Secondary | ICD-10-CM | POA: Diagnosis not present

## 2018-07-17 DIAGNOSIS — M6281 Muscle weakness (generalized): Secondary | ICD-10-CM | POA: Diagnosis not present

## 2018-07-17 DIAGNOSIS — J9610 Chronic respiratory failure, unspecified whether with hypoxia or hypercapnia: Secondary | ICD-10-CM | POA: Diagnosis not present

## 2018-07-17 DIAGNOSIS — G4700 Insomnia, unspecified: Secondary | ICD-10-CM | POA: Diagnosis not present

## 2018-07-17 DIAGNOSIS — N390 Urinary tract infection, site not specified: Secondary | ICD-10-CM | POA: Diagnosis not present

## 2018-07-17 DIAGNOSIS — E440 Moderate protein-calorie malnutrition: Secondary | ICD-10-CM | POA: Diagnosis not present

## 2018-07-17 DIAGNOSIS — I361 Nonrheumatic tricuspid (valve) insufficiency: Secondary | ICD-10-CM | POA: Diagnosis not present

## 2018-07-17 DIAGNOSIS — R2689 Other abnormalities of gait and mobility: Secondary | ICD-10-CM | POA: Diagnosis not present

## 2018-07-17 DIAGNOSIS — I5022 Chronic systolic (congestive) heart failure: Secondary | ICD-10-CM | POA: Diagnosis not present

## 2018-07-17 DIAGNOSIS — R296 Repeated falls: Secondary | ICD-10-CM | POA: Diagnosis not present

## 2018-07-17 DIAGNOSIS — G8929 Other chronic pain: Secondary | ICD-10-CM | POA: Diagnosis not present

## 2018-07-17 DIAGNOSIS — R54 Age-related physical debility: Secondary | ICD-10-CM | POA: Diagnosis not present

## 2018-07-17 DIAGNOSIS — R609 Edema, unspecified: Secondary | ICD-10-CM | POA: Diagnosis not present

## 2018-07-17 DIAGNOSIS — R000 Tachycardia, unspecified: Secondary | ICD-10-CM | POA: Diagnosis not present

## 2018-07-17 DIAGNOSIS — F329 Major depressive disorder, single episode, unspecified: Secondary | ICD-10-CM | POA: Diagnosis not present

## 2018-07-17 DIAGNOSIS — I272 Pulmonary hypertension, unspecified: Secondary | ICD-10-CM | POA: Diagnosis not present

## 2018-07-17 DIAGNOSIS — M2550 Pain in unspecified joint: Secondary | ICD-10-CM | POA: Diagnosis not present

## 2018-07-17 DIAGNOSIS — K219 Gastro-esophageal reflux disease without esophagitis: Secondary | ICD-10-CM | POA: Diagnosis not present

## 2018-07-17 DIAGNOSIS — Z515 Encounter for palliative care: Secondary | ICD-10-CM | POA: Diagnosis not present

## 2018-07-18 DIAGNOSIS — L03116 Cellulitis of left lower limb: Secondary | ICD-10-CM | POA: Diagnosis not present

## 2018-07-18 DIAGNOSIS — G894 Chronic pain syndrome: Secondary | ICD-10-CM | POA: Diagnosis not present

## 2018-07-18 DIAGNOSIS — I270 Primary pulmonary hypertension: Secondary | ICD-10-CM | POA: Diagnosis not present

## 2018-07-18 DIAGNOSIS — I509 Heart failure, unspecified: Secondary | ICD-10-CM | POA: Diagnosis not present

## 2018-07-22 DIAGNOSIS — G894 Chronic pain syndrome: Secondary | ICD-10-CM | POA: Diagnosis not present

## 2018-07-22 DIAGNOSIS — R5381 Other malaise: Secondary | ICD-10-CM | POA: Diagnosis not present

## 2018-07-24 DIAGNOSIS — L03116 Cellulitis of left lower limb: Secondary | ICD-10-CM | POA: Diagnosis not present

## 2018-07-24 DIAGNOSIS — G894 Chronic pain syndrome: Secondary | ICD-10-CM | POA: Diagnosis not present

## 2018-07-24 DIAGNOSIS — I270 Primary pulmonary hypertension: Secondary | ICD-10-CM | POA: Diagnosis not present

## 2018-07-24 DIAGNOSIS — I509 Heart failure, unspecified: Secondary | ICD-10-CM | POA: Diagnosis not present

## 2018-07-25 DIAGNOSIS — I509 Heart failure, unspecified: Secondary | ICD-10-CM | POA: Diagnosis not present

## 2018-07-25 DIAGNOSIS — R609 Edema, unspecified: Secondary | ICD-10-CM | POA: Diagnosis not present

## 2018-07-28 DIAGNOSIS — R609 Edema, unspecified: Secondary | ICD-10-CM | POA: Diagnosis not present

## 2018-07-30 DIAGNOSIS — I509 Heart failure, unspecified: Secondary | ICD-10-CM | POA: Diagnosis not present

## 2018-07-30 DIAGNOSIS — I270 Primary pulmonary hypertension: Secondary | ICD-10-CM | POA: Diagnosis not present

## 2018-07-30 DIAGNOSIS — E039 Hypothyroidism, unspecified: Secondary | ICD-10-CM | POA: Diagnosis not present

## 2018-07-30 DIAGNOSIS — G894 Chronic pain syndrome: Secondary | ICD-10-CM | POA: Diagnosis not present

## 2018-08-01 DIAGNOSIS — R609 Edema, unspecified: Secondary | ICD-10-CM | POA: Diagnosis not present

## 2018-08-01 DIAGNOSIS — R296 Repeated falls: Secondary | ICD-10-CM | POA: Diagnosis not present

## 2018-08-04 DIAGNOSIS — H409 Unspecified glaucoma: Secondary | ICD-10-CM | POA: Diagnosis not present

## 2018-08-04 DIAGNOSIS — G894 Chronic pain syndrome: Secondary | ICD-10-CM | POA: Diagnosis not present

## 2018-08-04 DIAGNOSIS — E039 Hypothyroidism, unspecified: Secondary | ICD-10-CM | POA: Diagnosis not present

## 2018-08-04 DIAGNOSIS — I270 Primary pulmonary hypertension: Secondary | ICD-10-CM | POA: Diagnosis not present

## 2018-08-06 DIAGNOSIS — L03116 Cellulitis of left lower limb: Secondary | ICD-10-CM | POA: Diagnosis not present

## 2018-08-07 DIAGNOSIS — R5381 Other malaise: Secondary | ICD-10-CM | POA: Diagnosis not present

## 2018-08-07 DIAGNOSIS — L03116 Cellulitis of left lower limb: Secondary | ICD-10-CM | POA: Diagnosis not present

## 2018-08-07 DIAGNOSIS — R609 Edema, unspecified: Secondary | ICD-10-CM | POA: Diagnosis not present

## 2018-08-07 DIAGNOSIS — Z7189 Other specified counseling: Secondary | ICD-10-CM | POA: Diagnosis not present

## 2018-08-08 DIAGNOSIS — G894 Chronic pain syndrome: Secondary | ICD-10-CM | POA: Diagnosis not present

## 2018-08-11 DIAGNOSIS — G894 Chronic pain syndrome: Secondary | ICD-10-CM | POA: Diagnosis not present

## 2018-08-11 DIAGNOSIS — R609 Edema, unspecified: Secondary | ICD-10-CM | POA: Diagnosis not present

## 2018-08-11 DIAGNOSIS — L03116 Cellulitis of left lower limb: Secondary | ICD-10-CM | POA: Diagnosis not present

## 2018-08-11 DIAGNOSIS — R5381 Other malaise: Secondary | ICD-10-CM | POA: Diagnosis not present

## 2018-08-12 DIAGNOSIS — I071 Rheumatic tricuspid insufficiency: Secondary | ICD-10-CM | POA: Diagnosis not present

## 2018-08-12 DIAGNOSIS — R000 Tachycardia, unspecified: Secondary | ICD-10-CM | POA: Diagnosis not present

## 2018-08-12 DIAGNOSIS — I509 Heart failure, unspecified: Secondary | ICD-10-CM | POA: Diagnosis not present

## 2018-08-12 DIAGNOSIS — I340 Nonrheumatic mitral (valve) insufficiency: Secondary | ICD-10-CM | POA: Diagnosis not present

## 2018-08-13 DIAGNOSIS — Z515 Encounter for palliative care: Secondary | ICD-10-CM | POA: Diagnosis not present
